# Patient Record
Sex: Male | Born: 1960 | Race: White | Hispanic: No | Marital: Married | Smoking: Never smoker
Health system: Southern US, Community
[De-identification: ages and names within clinical notes are randomized; demographics above are authoritative.]

## PROBLEM LIST (undated history)

## (undated) DIAGNOSIS — R12 Heartburn: Secondary | ICD-10-CM

## (undated) DIAGNOSIS — R001 Bradycardia, unspecified: Secondary | ICD-10-CM

## (undated) DIAGNOSIS — E039 Hypothyroidism, unspecified: Secondary | ICD-10-CM

## (undated) DIAGNOSIS — E119 Type 2 diabetes mellitus without complications: Secondary | ICD-10-CM

## (undated) DIAGNOSIS — R002 Palpitations: Secondary | ICD-10-CM

## (undated) DIAGNOSIS — I8390 Asymptomatic varicose veins of unspecified lower extremity: Secondary | ICD-10-CM

## (undated) DIAGNOSIS — E669 Obesity, unspecified: Secondary | ICD-10-CM

## (undated) DIAGNOSIS — I10 Essential (primary) hypertension: Secondary | ICD-10-CM

## (undated) DIAGNOSIS — M7989 Other specified soft tissue disorders: Secondary | ICD-10-CM

## (undated) DIAGNOSIS — R7303 Prediabetes: Secondary | ICD-10-CM

## (undated) DIAGNOSIS — E7800 Pure hypercholesterolemia, unspecified: Secondary | ICD-10-CM

## (undated) DIAGNOSIS — I4891 Unspecified atrial fibrillation: Secondary | ICD-10-CM

## (undated) DIAGNOSIS — M549 Dorsalgia, unspecified: Secondary | ICD-10-CM

## (undated) DIAGNOSIS — E785 Hyperlipidemia, unspecified: Secondary | ICD-10-CM

## (undated) DIAGNOSIS — I4819 Other persistent atrial fibrillation: Secondary | ICD-10-CM

## (undated) DIAGNOSIS — I77810 Thoracic aortic ectasia: Secondary | ICD-10-CM

## (undated) DIAGNOSIS — M2550 Pain in unspecified joint: Secondary | ICD-10-CM

## (undated) DIAGNOSIS — I428 Other cardiomyopathies: Secondary | ICD-10-CM

## (undated) DIAGNOSIS — M1990 Unspecified osteoarthritis, unspecified site: Secondary | ICD-10-CM

## (undated) DIAGNOSIS — G4733 Obstructive sleep apnea (adult) (pediatric): Secondary | ICD-10-CM

## (undated) HISTORY — DX: Pain in unspecified joint: M25.50

## (undated) HISTORY — DX: Essential (primary) hypertension: I10

## (undated) HISTORY — DX: Dorsalgia, unspecified: M54.9

## (undated) HISTORY — DX: Other cardiomyopathies: I42.8

## (undated) HISTORY — DX: Hyperlipidemia, unspecified: E78.5

## (undated) HISTORY — DX: Asymptomatic varicose veins of unspecified lower extremity: I83.90

## (undated) HISTORY — DX: Thoracic aortic ectasia: I77.810

## (undated) HISTORY — DX: Other specified soft tissue disorders: M79.89

## (undated) HISTORY — DX: Pure hypercholesterolemia, unspecified: E78.00

## (undated) HISTORY — PX: CARDIAC SURGERY: SHX584

## (undated) HISTORY — DX: Obstructive sleep apnea (adult) (pediatric): G47.33

## (undated) HISTORY — DX: Obesity, unspecified: E66.9

## (undated) HISTORY — DX: Heartburn: R12

## (undated) HISTORY — DX: Bradycardia, unspecified: R00.1

## (undated) HISTORY — DX: Other persistent atrial fibrillation: I48.19

## (undated) HISTORY — PX: COLONOSCOPY: SHX174

## (undated) HISTORY — DX: Palpitations: R00.2

## (undated) HISTORY — DX: Prediabetes: R73.03

---

## 2001-06-14 ENCOUNTER — Inpatient Hospital Stay (HOSPITAL_COMMUNITY): Admission: EM | Admit: 2001-06-14 | Discharge: 2001-06-18 | Payer: Self-pay | Admitting: Emergency Medicine

## 2002-02-08 ENCOUNTER — Inpatient Hospital Stay (HOSPITAL_COMMUNITY): Admission: AD | Admit: 2002-02-08 | Discharge: 2002-02-10 | Payer: Self-pay | Admitting: Cardiology

## 2003-07-20 ENCOUNTER — Ambulatory Visit (HOSPITAL_BASED_OUTPATIENT_CLINIC_OR_DEPARTMENT_OTHER): Admission: RE | Admit: 2003-07-20 | Discharge: 2003-07-20 | Payer: Self-pay | Admitting: Cardiology

## 2003-11-05 ENCOUNTER — Encounter: Admission: RE | Admit: 2003-11-05 | Discharge: 2003-11-05 | Payer: Self-pay | Admitting: Cardiology

## 2004-05-13 ENCOUNTER — Ambulatory Visit (HOSPITAL_COMMUNITY): Admission: RE | Admit: 2004-05-13 | Discharge: 2004-05-13 | Payer: Self-pay | Admitting: Cardiology

## 2005-12-14 ENCOUNTER — Encounter: Admission: RE | Admit: 2005-12-14 | Discharge: 2005-12-14 | Payer: Self-pay | Admitting: Endocrinology

## 2006-10-26 ENCOUNTER — Ambulatory Visit: Payer: Self-pay | Admitting: Vascular Surgery

## 2007-02-01 ENCOUNTER — Ambulatory Visit: Payer: Self-pay | Admitting: Vascular Surgery

## 2007-03-14 ENCOUNTER — Ambulatory Visit: Payer: Self-pay | Admitting: Vascular Surgery

## 2007-03-23 ENCOUNTER — Ambulatory Visit: Payer: Self-pay | Admitting: Vascular Surgery

## 2007-06-13 ENCOUNTER — Ambulatory Visit: Payer: Self-pay | Admitting: Vascular Surgery

## 2007-06-27 ENCOUNTER — Ambulatory Visit: Payer: Self-pay | Admitting: Vascular Surgery

## 2009-04-10 ENCOUNTER — Inpatient Hospital Stay (HOSPITAL_COMMUNITY): Admission: EM | Admit: 2009-04-10 | Discharge: 2009-04-16 | Payer: Self-pay | Admitting: Emergency Medicine

## 2009-04-10 ENCOUNTER — Ambulatory Visit: Payer: Self-pay | Admitting: Internal Medicine

## 2009-04-11 ENCOUNTER — Encounter: Payer: Self-pay | Admitting: Internal Medicine

## 2010-09-25 LAB — COMPREHENSIVE METABOLIC PANEL
ALT: 87 U/L — ABNORMAL HIGH (ref 0–53)
Albumin: 3.4 g/dL — ABNORMAL LOW (ref 3.5–5.2)
Alkaline Phosphatase: 64 U/L (ref 39–117)
CO2: 26 mEq/L (ref 19–32)
Creatinine, Ser: 0.93 mg/dL (ref 0.4–1.5)
GFR calc Af Amer: >60 mL/min (ref >60)
GFR calc non Af Amer: >60 mL/min (ref >60)
Glucose, Bld: 106 mg/dL — ABNORMAL HIGH (ref 70–99)
Total Protein: 5.8 g/dL — ABNORMAL LOW (ref 6.0–8.3)

## 2010-09-25 LAB — DIFFERENTIAL
Basophils Absolute: 0.1 10*3/uL (ref 0.0–0.1)
Eosinophils Absolute: 0.1 10*3/uL (ref 0.0–0.7)
Eosinophils Absolute: 0.3 10*3/uL (ref 0.0–0.7)
Eosinophils Relative: 1 % (ref 0–5)
Eosinophils Relative: 3 % (ref 0–5)
Lymphocytes Relative: 21 % (ref 12–46)
Lymphs Abs: 2.2 10*3/uL (ref 0.7–4.0)
Monocytes Relative: 7 % (ref 3–12)
Neutro Abs: 7.2 10*3/uL (ref 1.7–7.7)
Neutrophils Relative %: 71 % (ref 43–77)

## 2010-09-25 LAB — CBC
HCT: 35.1 % — ABNORMAL LOW (ref 39.0–52.0)
Hemoglobin: 12 g/dL — ABNORMAL LOW (ref 13.0–17.0)
MCHC: 34.2 g/dL (ref 30.0–36.0)
MCV: 87.8 fL (ref 78.0–100.0)
MCV: 88.6 fL (ref 78.0–100.0)
Platelets: 164 10*3/uL (ref 150–400)
Platelets: 185 10*3/uL (ref 150–400)
Platelets: 212 10*3/uL (ref 150–400)
Platelets: 214 10*3/uL (ref 150–400)
RBC: 3.96 MIL/uL — ABNORMAL LOW (ref 4.22–5.81)
RBC: 4.48 MIL/uL (ref 4.22–5.81)
RDW: 14.3 % (ref 11.5–15.5)
RDW: 14.5 % (ref 11.5–15.5)
WBC: 10.3 10*3/uL (ref 4.0–10.5)

## 2010-09-25 LAB — BASIC METABOLIC PANEL
BUN: 11 mg/dL (ref 6–23)
BUN: 17 mg/dL (ref 6–23)
BUN: 9 mg/dL (ref 6–23)
CO2: 28 mEq/L (ref 19–32)
Calcium: 8.6 mg/dL (ref 8.4–10.5)
Calcium: 8.8 mg/dL (ref 8.4–10.5)
Calcium: 8.8 mg/dL (ref 8.4–10.5)
Chloride: 106 mEq/L (ref 96–112)
Creatinine, Ser: 0.86 mg/dL (ref 0.4–1.5)
Creatinine, Ser: 0.89 mg/dL (ref 0.4–1.5)
Creatinine, Ser: 0.9 mg/dL (ref 0.4–1.5)
GFR calc non Af Amer: >60 mL/min (ref >60)
GFR calc non Af Amer: >60 mL/min (ref >60)
Glucose, Bld: 101 mg/dL — ABNORMAL HIGH (ref 70–99)
Glucose, Bld: 113 mg/dL — ABNORMAL HIGH (ref 70–99)
Potassium: 4.4 mEq/L (ref 3.5–5.1)

## 2010-09-25 LAB — LIPID PANEL
Cholesterol: 105 mg/dL (ref 0–200)
LDL Cholesterol: 62 mg/dL (ref 0–99)
Triglycerides: 98 mg/dL (ref <150)

## 2010-09-25 LAB — HEMOGLOBIN A1C
Hgb A1c MFr Bld: 5.9 % (ref 4.6–6.1)
Mean Plasma Glucose: 123 mg/dL

## 2010-09-25 LAB — MAGNESIUM: Magnesium: 1.9 mg/dL (ref 1.5–2.5)

## 2010-09-25 LAB — PROTIME-INR
INR: 1.01 (ref 0.00–1.49)
INR: 1.03 (ref 0.00–1.49)
INR: 1.11 (ref 0.00–1.49)
INR: 1.56 — ABNORMAL HIGH (ref 0.00–1.49)
Prothrombin Time: 13.4 seconds (ref 11.6–15.2)
Prothrombin Time: 14.2 seconds (ref 11.6–15.2)
Prothrombin Time: 16.1 seconds — ABNORMAL HIGH (ref 11.6–15.2)
Prothrombin Time: 18.5 seconds — ABNORMAL HIGH (ref 11.6–15.2)
Prothrombin Time: 22.1 seconds — ABNORMAL HIGH (ref 11.6–15.2)

## 2010-09-25 LAB — APTT: aPTT: 24 seconds (ref 24–37)

## 2010-09-25 LAB — TSH: TSH: 1.069 u[IU]/mL (ref 0.350–4.500)

## 2010-09-25 LAB — CK TOTAL AND CKMB (NOT AT ARMC)
CK, MB: 3.3 ng/mL (ref 0.3–4.0)
Relative Index: 1.6 (ref 0.0–2.5)

## 2010-10-11 IMAGING — CR DG CHEST 2V
2 series · 2 of 2 positions shown · non-contrast
Comparison: Most recent chest of 11/05/2003

CLINICAL DATA: Palpitations, atrial fibrillation

CHEST - 2 VIEW

[w chest pa]
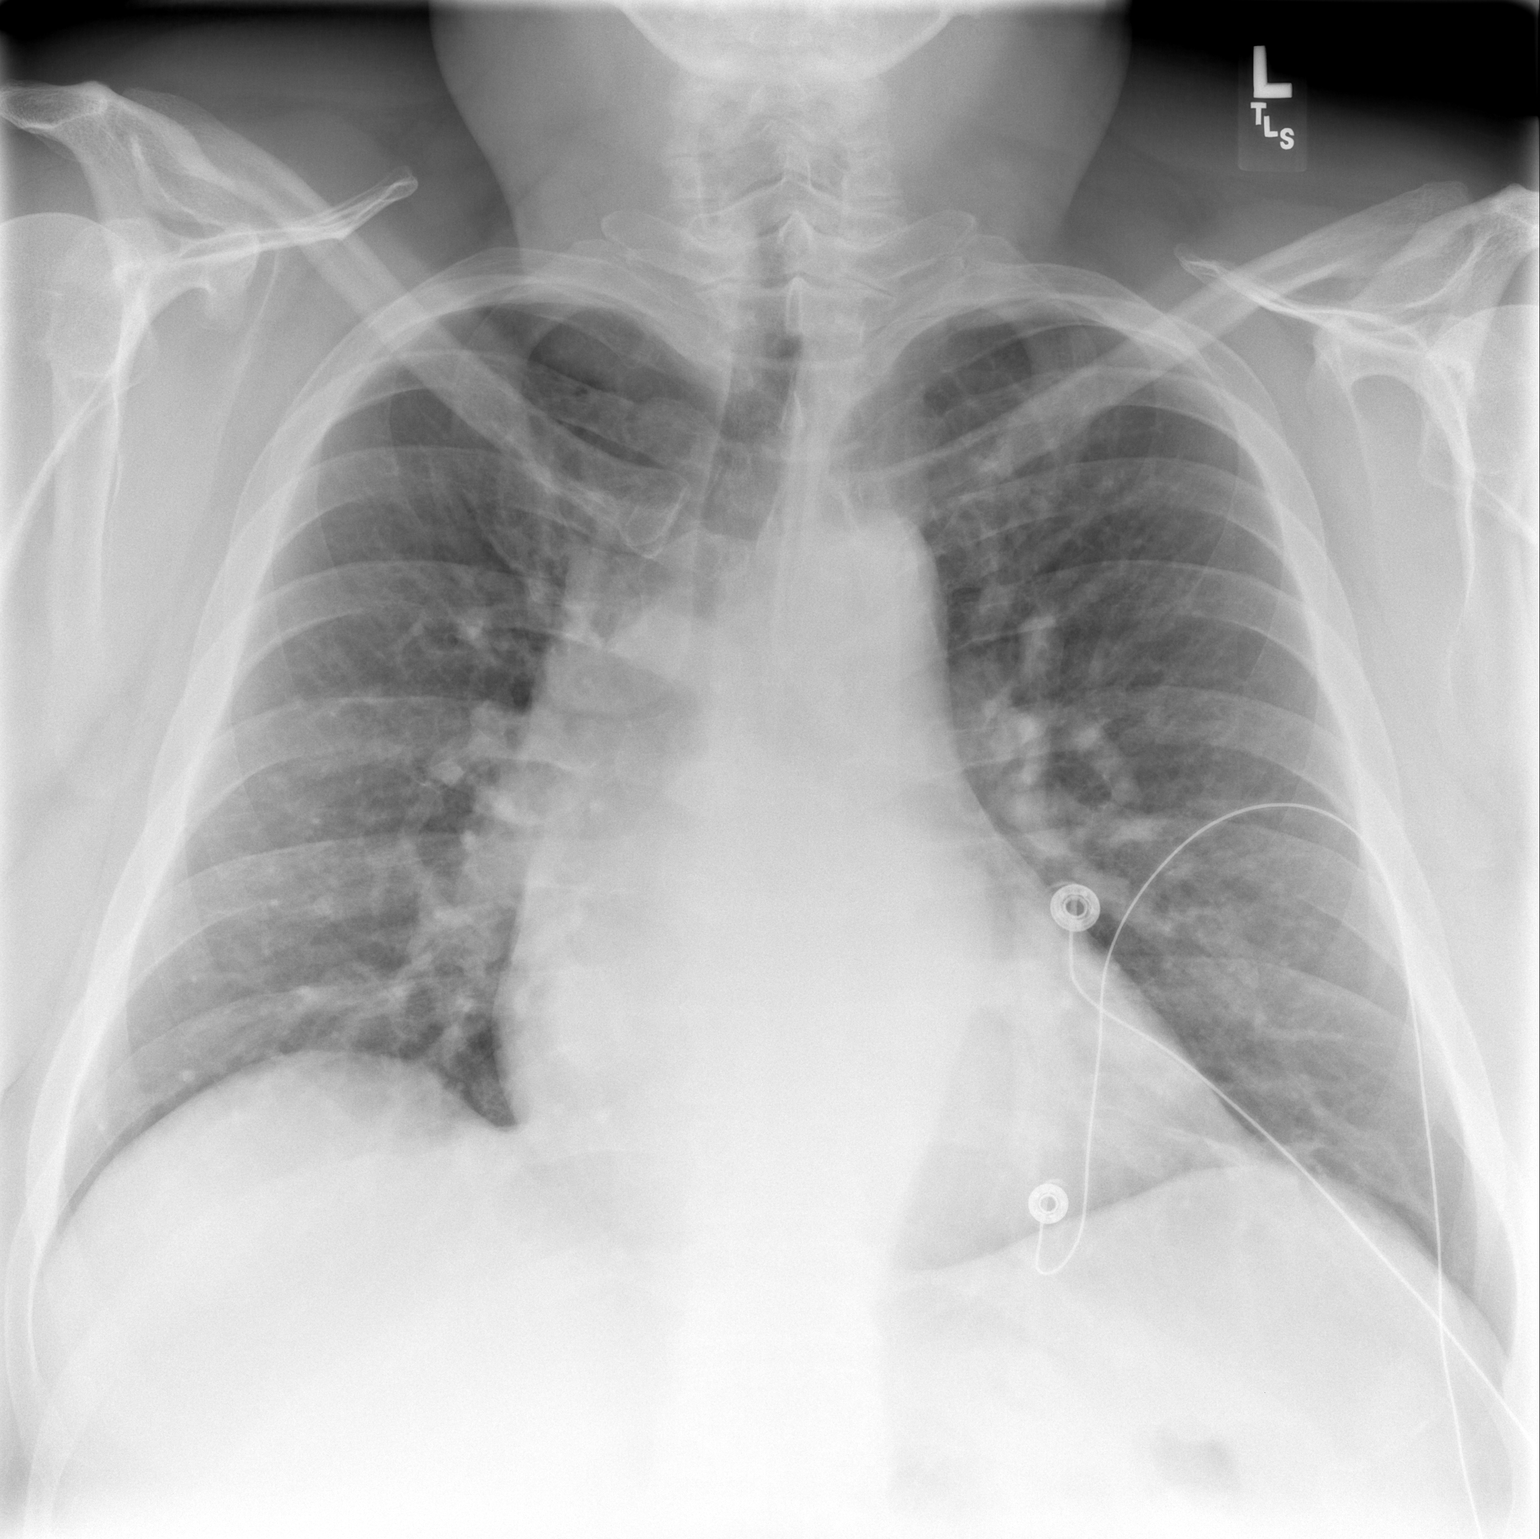

[w chest lat *]
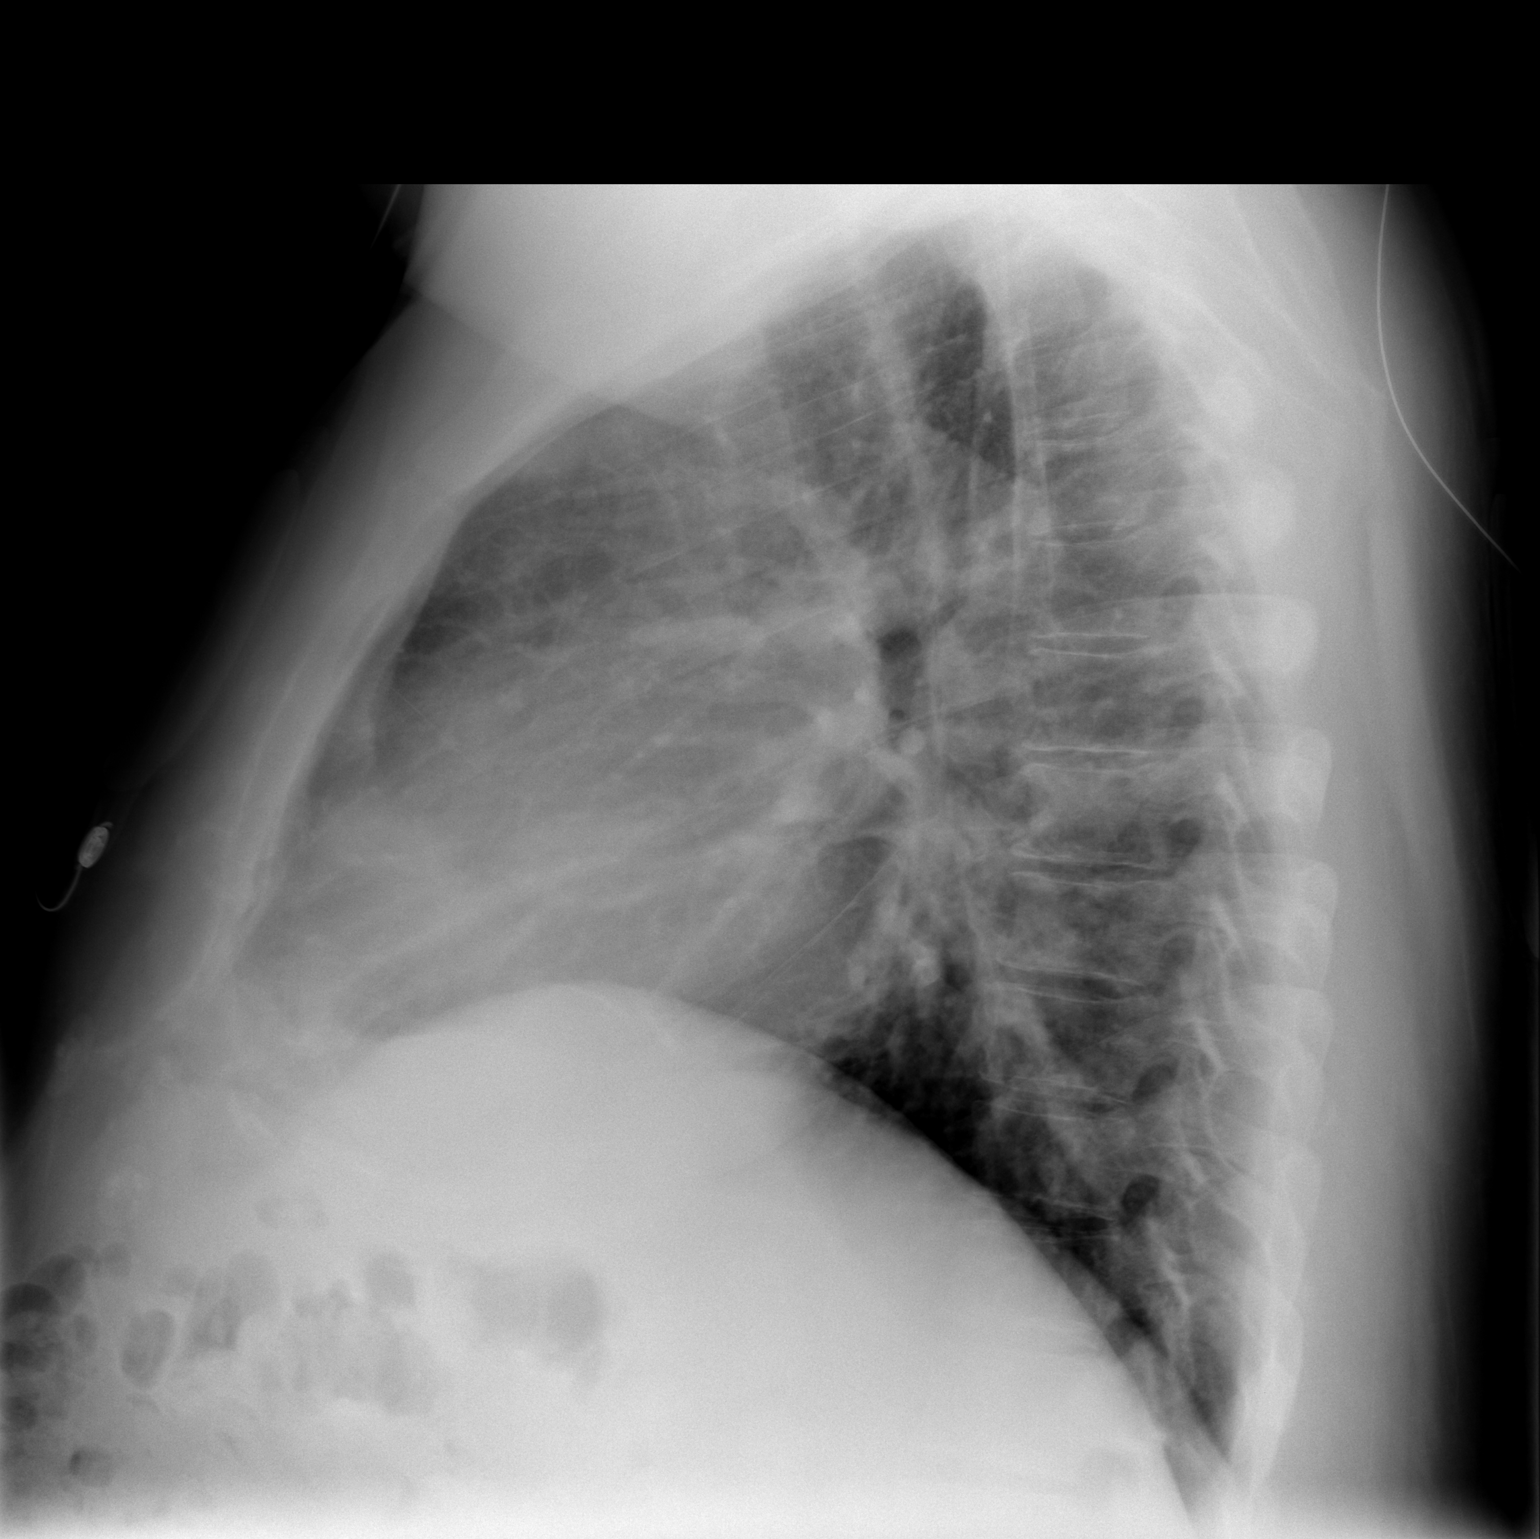

[2 of 2 positions shown; findings below may reference images not displayed]

FINDINGS: The heart is enlarged.  There is mild vascular
congestion.  There is no overt infiltrate or failure.  Worsening
aeration compared with priors.  No bony abnormality.
IMPRESSION: As  above.

## 2010-11-04 NOTE — Assessment & Plan Note (Signed)
OFFICE VISIT   RHODERICK, Geoffrey Robinson  DOB:  05/26/1961                                       02/01/2007  [id number]#:[id number]   Mr. 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  He has severe venous  insufficiency with gross saphenofemoral reflux and reflux throughout  both greater saphenous veins.  He did have a surgical procedure in 1995  where the medial aspect of his right greater saphenous vein was removed  but our venous duplex exam performed May 6 of this year reveals a large  residual lateral branch with reflux feeding a large string of  varicosities into the distal thigh and the medial calf.  He has  hyperpigmentation with no ulcer formation.  On the left side he has a  similar situation with a large nest of varicosities in the medial calf  and hyperpigmentation distally.  His elastic decompression stockings  have not relieved his symptoms.  He continues to have severe aching and  throbbing and discomfort with distal edema, and these severe skin  changes.  He is on chronic Coumadin for an arrhythmia he had in the  past.  I believe we should proceed with laser ablation of his right  greater saphenous vein with multiple stab phlebectomies after  discontinuing Coumadin for 6 days following his precertification by the  insurance company.  We will then follow in the future with laser  ablation only of his left greater saphenous vein.   Quita Skye Hart Rochester, M.D.  Electronically Signed   JDL/MEDQ  D:  02/01/2007  T:  02/03/2007  Job:  [id number]

## 2010-11-04 NOTE — Assessment & Plan Note (Signed)
OFFICE VISIT   PARNELL, SPIELER  DOB:  1961-01-04                                       06/27/2007  AVWUJ#:[id number]   The patient returns today 2 weeks post laser ablation of his left  greater saphenous vein for severe venous insufficiency of the left leg  and a history of venous stasis ulcers.  THE procedure was performed on  December 22 with no stab phlebectomies or sclerotherapy.  He has had no  worsening of the chronic left leg edema since the procedure, but has had  some mild to moderate aching discomfort in the thigh, as one would  expect following the laser ablation procedure.  He has had no blistering  of the skin or erythema, and the tenderness is now resolved.  The areas  of hyperpigmentation and scaliness in the left lower legs are unchanged  with no active ulceration.   EXAM:  Blood pressure 153/91, heart rate is 81, respirations 16.  He has  excellent femoral, poplitea, and dorsalis pedis pulses palpable  bilaterally.  The left leg has some mild tenderness to deep palpation  over the greater saphenous vein, and the area of hyperpigmentation in  the medial aspect of the left calf is unchanged.  There is no active  ulceration.  I performed a limited venous duplex exam in the office  today.  There is no evidence of any deep venous obstruction.  The  saphenous vein is totally occluded from the saphenofemoral junction to  the knee and non-compressible, with normal-appearing flow in the deep  venous system.   I have reassured him regarding these findings, and suggested that he  wear elastic compression stockings (short-leg) on a chronic basis.  He  will return in 6 months for a final followup venous duplex exam of both  lower extremities.   Quita Skye Hart [place], M.D.  Electronically Signed   JDL/MEDQ  D:  06/27/2007  T:  06/28/2007  Job:  [id number]

## 2010-11-04 NOTE — Assessment & Plan Note (Signed)
OFFICE VISIT   [name], [name]  DOB:  Jan 25, 1961                                       03/23/2007  [id number]#:[id number]   NOTE:  Geoffrey Robinson presents today for followup of his right leg laser  ablation and multiple stab phlebectomies with laser ablation of the  greater saphenous vein.  He has the usual amount of soreness and does  have some significant irritation at the upper edge from the compression  garment.  He is now 10 days and is instructed to discontinue the use of  his compression garment since this is causing him a great deal of  difficulty and discomfort.  His stab phlebectomy sites are all healing  quite nicely.  He does have usual amount of bruising and erythema in the  mid thigh.  He underwent limited venous duplex, and this reveals closure  of his saphenous vein from the knee to the saphenofemoral junction.  The  common femoral vein is widely patent, and the saphenous vein is occluded  up to the saphenofemoral junction.  I am pleased with his initial  results.  He will continue his followup with Dr. Hart Rochester and has plans  for laser ablation of his left greater saphenous vein and will schedule  this in our office.   Larina Earthly, M.D.  Electronically Signed   TFE/MEDQ  D:  03/23/2007  T:  03/24/2007  Job:  [id number]   cc:   Quita Skye. Hart Rochester, M.D.

## 2010-11-07 NOTE — Op Note (Signed)
[place]. [facility]  Patient:    KIET, GEER Visit Number: [id number] MRN: [id number]          Service Type: MED Location: 2000 2019 01 Attending Physician:  [name] Dictated by:   Doylene Canning. Ladona Ridgel, M.D. [facility] Proc. Date: 06/17/01 Admit Date:  06/14/2001 Discharge Date: 06/18/2001   CC:         [name], M.D.  Kathrine Cords   Operative Report  HISTORY OF PRESENT ILLNESS:  The patient is a very pleasant young man with a two year history of tachy-palpitations who was admitted to the hospital with chest pain and shortness of breath in association with a wide QRS tachycardia with right bundle branch block morphology at 250 beats per minute.  He was found to have multiple atrial arrhythmias, and is now referred for an electrophysiological study.  DESCRIPTION OF PROCEDURE:  After informed consent was obtained, the patient was taken to the diagnostic EP lab in the fasted state.  After usual preparation and draping, intravenous phentanyl and midazolam was given for sedation.  A 6 [place] hexipolar catheter was inserted percutaneously in the right jugular vein and advanced to the coronary sinus.  A 5 French quadripolar catheter was inserted percutaneously in the right femoral vein and advanced to the RV apex.  A 5 French quadripolar catheter was inserted percutaneously in the right femoral vein, and advanced to the hisbundle region.  After measurement of the basic intervals, rapid ventricular pacing was carried out from the RV apex at a pacing cycle length of 590 milliseconds, and stepwise decreased down to 400 milliseconds where [place] wenkebach was observed.  During rapid ventricular pacing, the atrial activation sequence was midline and decremental.  Next, programmed ventricular stimulation was carried out from the RV apex at a basic dry cycle length of 600 milliseconds.  The S1/S2 interval was stepwise decreased down to 230 milliseconds where a  retrograde AV node ERP was observed.  Once again, during programmed ventricular stimulation the atrial activation sequence was midline and decremental.  Next, programmed atrial stimulation was carried out from the coronary sinus at a basic dry cycle length of 600 milliseconds.  The S1/S2 interval stepwise decreased down to 230 milliseconds where the atrial ERP was observed.  During programmed atrial stimulation there was no inducible supraventricular tachycardia.  Next, rapid atrial pacing was carried out from the coronary sinus at a pacing cycle length of 490 milliseconds, and stepwise decreased down to 320 milliseconds where AV wenkebach was observed.  Additional decrements of atrial pacing cycle length were then carried out.  At a pacing cycle length of 270 milliseconds, there was inducible atrial tachycardia.  This arrhythmia was not sustained. The cycle length was approximately 300 milliseconds, and there was 1:1 AV conduction.  The earliest activation sequence was in the coronary sinus, both distal and proximal catheters were nearly simultaneous in their activation. This tachycardia terminated spontaneously after several seconds.  Additional rapid atrial pacing down to 230 milliseconds resulted in the initiation of atrial flutter.  The atrial flutter cycle length varied between 219 and 230 milliseconds.  During atrial flutter there was intermittent variable AV conduction along with 2:1 AV conduction.  There was right bundle branch block aberrancy.  At this point, the 20 pole Halo catheter was inserted after the 5 French RV catheter was removed, and mapping was carried out of the ______ atrial flutter.  The atrial activation sequence around the tricuspid angles was counterclockwise in a typical atrial flutter  isthmus conduction.  The quadripolar ablation catheter was then maneuvered into the right atrium, and 7 RF synergy applications were subsequently carried out between the  tricuspid valve annulus and the inferior vena cava.  During the seventh RF synergy application, atrial flutter was terminated and sinus rhythm restored.  Rapid pacing was carried out in the coronary sinus demonstrating residual isthmus conduction.  Four additional RF energy applications were then delivered to the atrial flutter isthmus which resulted in decrease of bidirectional block.  A bonus RF energy applications were also subsequently given, and the patient was observed for a total of 45 minutes.  After this, he had no inducible atrial flutter or other atrial arrhythmias.  The catheters were then removed. Hemostasis was assured, and the patient was returned to his room in satisfactory condition.  COMPLICATIONS:  None.  RESULTS: 1. Baseline ECG:  The baseline ECG demonstrated normal sinus rhythm with    normal axis and intervals. 2. Baseline intervals:  Sinus cycle length was 782 milliseconds.  The AV    interval was 55 milliseconds.  PR interval was 181 milliseconds. 3. Rapid atrial pacing:  Rapid atrial pacing carried out from the coronary    sinus was stepwise decreased from 490 milliseconds down to 320 milliseconds    where AV wenkebach was observed.  Additional decrements down to 270    milliseconds resulted in initiation of a nonsustained atrial tachycardia,    the etiology of which is unknown.  This tachycardia terminated    spontaneously.  Additional rapid atrial pacing was then carried out down to    230 milliseconds which resulted in initiation of typical atrial flutter at    a cycle length of 220 milliseconds. 4. Programmed atrial stimulation:  Programmed atrial stimulation was carried    out from the coronary sinus at a basic dry cycle length of 600    milliseconds.  The S1/S2 interval was stepwise decreased down to 230    milliseconds where an ERP of the atrium was observed. 5. Rapid ventricular pacing:  Rapid ventricular pacing was carried out from     the RV apex  and stepwise decreased down to 400 milliseconds where the [place]    wenkebach cycle length was observed.  During rapid ventricular pacing the    atrial activation sequence was again midline and decremental. 6. Programmed ventricular stimulation:  Programmed ventricular stimulation was    carried out from the RV apex at a basic dry cycle length of 500    milliseconds.  The S1/S2 interval was stepwise decreased down to 230    milliseconds where the ERP of the atrium was observed. 7. Arrhythmias observed:  Atrial flutter.  Initiation with rapid atrial    pacing, duration sustained.  Termination with catheter ablation, cycle    length 230 milliseconds.  Atrial tachycardia.  Initiation of rapid atrial    pacing, duration nonsustained, termination spontaneous, cycle length 300    milliseconds. 8. Mapping:  Mapping of the patients atrial flutter demonstrated typical    counterclockwise tricuspid annular activation. 9. RF energy application:  A total of 19 RF energy applications and ______ 7    RF energy applications which terminated atrial flutter and restored sinus    rhythm, 4 RF energy applications which created bidirectional block in the    atrial flutter isthmus, and 8 bonus RF energy applications were    subsequently delivered to the usual atrial flutter isthmus.  Following the    procedure, the patient was observed for  45 minutes.  There was no evidence    of any residual isthmus conduction or inducible flutter.  CONCLUSIONS:  This study demonstrates inducible atrial flutter, status post RV catheter ablation with a total of 19 RF energy applications delivered to the usual atrial flutter isthmus.  The patient tolerated the procedure very well. Dictated by:   Doylene Canning. Ladona Ridgel, M.D. [facility] Attending Physician:  [name] DD:  06/17/01 TD:  06/18/01 Job: [id number]

## 2010-11-07 NOTE — Cardiovascular Report (Signed)
[place]. [facility]  Patient:    Geoffrey Robinson, Geoffrey Robinson Visit Number: [id number] MRN: [id number]          Service Type: MED Location: 2000 2019 01 Attending Physician:  [name] Dictated by:   [name], M.D. Proc. Date: 06/16/01 Admit Date:  06/14/2001   CC:         Doylene Canning. Ladona Ridgel, M.D. [facility]   Cardiac Catheterization  PROCEDURES:  Left heart catheterization, coronary angiography, left ventriculography.  OPERATOR:  [name], M.D.  INDICATIONS:  SVT, positive cardiac enzymes, and LV dysfunction.  COMPLICATIONS:  None.  IV MEDICATIONS:  Lopressor 5 mg IV in catheterization lab.  This is a 50 year old white male who has a one- to two-year history of intermittent palpitations almost always terminated by coughing.  He presented to the emergency room in rapid SVT with right bundle branch aberrancy at 250 beats per minute and was given 6 mg of IV adenosine with a change in his heart rhythm to 140 beats per minute and then subsequently to sinus rhythm.  He did rule in for a small non-Q-wave MI by positive cardiac enzymes that were only mildly elevated.  A 2D echocardiogram showed mild to moderate LV dysfunction with inferior posterior hypokinesis.  He now presents for cardiac catheterization.  DESCRIPTION OF PROCEDURE:  The patient is brought to the cardiac catheterization laboratory in the fasting nonsedated state.  Informed consent was obtained.  The patient was connected to continuous heart rate and pulse oximetry monitoring and intermittent blood pressure monitoring.  The right groin was prepped and draped in a sterile fashion.  Xylocaine, 1%, was used for local anesthesia.  Using the modified Seldinger technique, a 6-French sheath was placed in the right femoral artery.  Under fluoroscopic guidance, a 6-French JL4 catheter was placed in the left coronary artery.  Multiple cine films were taken in the 30-degree RAO, 40-degree LAO views.   This catheter was then exchanged out over a guidewire for a 6-French JR4 catheter which was placed under fluoroscopic guidance into the right coronary artery.  Multiple cine films were taken in the 30-degree RAO, 40-degree LAO views.  This catheter was then exchanged out over a guidewire for a 6-French angled pigtail catheter which was placed in the left ventricular cavity.  Left ventriculography was performed in 30-degree RAO view using a total of 30 cc of contrast at 13 cc per second.  The catheter was then pulled back across the aortic valve and removed over a guidewire.  Of note, the patient did develop intermittent SVT and paroxysmal atrial fibrillation during the study requiring a total of 5 mg of IV Lopressor.  All catheters and sheaths were removed. Manual compression was performed until adequate hemostasis was obtained.  The patient was transferred back to the room in stable condition.  RESULTS: 1. The left main coronary artery was widely patent and bifurcates into the    left anterior descending artery and left circumflex artery. 2. The left anterior descending artery is large and widely patent throughout    the course.  It gives rise to one large diagonal branch which has several    daughter branches, all of which are widely patent. 3. The left circumflex is widely patent throughout its course and gives rise    to two obtuse marginal branches, both of which are widely patent. 4. The right coronary artery is a large vessel as well and gives off three    RV marginal branches which are widely  patent.  The right coronary artery    is widely patent throughout the course and bifurcates distally into a    posterior descending artery and a posterolateral artery.  They are both    widely patent.  Left ventriculography performed in the 30-degree RAO view using a total of 30 cc of contrast at 13 cc per second showed mild to moderately dilated LV with moderate LV dysfunction globally.   EF 35-40%.  ASSESSMENT: 1. Supraventricular tachycardia with paroxysmal atrial fibrillation. 2. Positive cardiac enzymes secondary to supraventricular tachycardia. 3. Normal coronary arteries. 4. 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  PLAN:  Continue Lovenox for now.  EP consult, Dr. Ladona Ridgel, for possible RFA of SVT.  IV Cardizem drip with a 20 mg IV bolus then 5 mg per hour.  Will increase the dose to keep heart rate less than 100.  Increase Toprol XL to 50 mg a day.Dictated by:   [name], M.D. Attending Physician:  [name] DD:  06/16/01 TD:  06/16/01 Job: [id number]

## 2010-11-07 NOTE — H&P (Signed)
[place]. [facility]  Patient:    Geoffrey Robinson, Geoffrey Robinson Visit Number: [id number] MRN: [id number]          Service Type: MED Location: 2000 2019 01 Attending Physician:  [name] Dictated by:   [name], M.D. Admit Date:  06/14/2001                           History and Physical  CHIEF COMPLAINT:  Palpitations, SVT.  HISTORY OF PRESENT ILLNESS:  This is a 50 year old white male with no previous cardiac history but symptoms of palpitations for 1-1/2 years.  He presented to the emergency room with complaints of palpitations starting this morning.  He has had palpitations for the past 1-1/2 years.  No history of dizziness or syncope.  Episodes typically are terminated with coughing.  Occasional mild chest pain with episodes but has chest pain on no other occasions, and no exertional shortness of breath.  He only has shortness of breath with palpitations.  Today he finished washing dishes and developed palpitations which persisted despite his usual maneuvers to terminate them.  EMS was called, and he was given 6 mg of IV adenosine for SVT with right bundle branch block, with a very rapid heart rate of 252 beats per minute.  PAST MEDICAL HISTORY: 1. Palpitations for 1-1/2 years. 2. Knee pain.  PAST SURGICAL HISTORY: 1. Knee arthroscopy. 2. Right leg surgery for superficial thrombophlebitis.  ALLERGIES:  None.  MEDICATIONS:  Aleve and aspirin.  SOCIAL HISTORY:  He denies tobacco or alcohol use.  He is married.  He is a Curator.  FAMILY HISTORY:  His mother and father both had coronary artery bypass grafting in their 25s and 42s.  REVIEW OF SYSTEMS:  Otherwise negative.  PHYSICAL EXAMINATION:  GENERAL:  Well-developed, well-nourished white male in no acute distress.  VITAL SIGNS:  Blood pressure 128/80, pulse 98, temperature 99.2, respirations 22.  NECK:  Supple.  Without lymphadenopathy.  No bruits.  Carotid upstrokes +2.  LUNGS:   Clear to auscultation throughout.  HEART:  Regular rate and rhythm.  No murmurs, rubs, or gallops.  Normal S1, S2.  ABDOMEN:  Soft, nontender, nondistended.  With active bowel sounds.  No hepatosplenomegaly.  EXTREMITIES:  No cyanosis, erythema, edema.  Good distal pulses.  LABORATORY DATA:  BMET within normal limits.  Hemoglobin 15, hematocrit 45. Troponin elevated at 0.14, CPK 216, MB elevated at 5.6, relative index 2.6. Two-dimensional echocardiogram shows low normal LV function with distal lateral and apical inferoposterior hypokinesis. EF around 50%.  Twelve-lead EKG during SVT showed SVT with right bundle branch block at 252 beats per minute.  After adenosine, EKG showed normal sinus rhythm with incomplete right bundle branch block.  No ST and T-wave abnormalities.  ASSESSMENT: 1. Supraventricular tachycardia with right bundle branch block aberration,    terminated with adenosine. 2. Mild chest pressure associated with palpitations.  He does not have    typical exertional chest pain or dyspnea.  He does have a family history of    coronary disease and a 2-D echocardiogram that does show some wall motion    abnormalities.  These could be related to his recent prolonged episode of    tachycardia, and this could also explain the elevation in the cardiac    enzymes.  PLAN:  Will admit to telemetry bed.  Continue to follow cardiac enzymes.  If there is no further increase in the enzymes, we will plan exercise  treadmill test on Thursday.  Otherwise, if the enzymes continue to increase, will plan cardiac catheterization.  Continue Toprol XL 25 mg a day.  Start Lovenox subcutaneous.  Aspirin q.d.  Check a TSH.  Also, check a fasting lipid panel. Dictated by:   [name], M.D. Attending Physician:  [name] DD:  06/14/01 TD:  06/15/01 Job: [id number]

## 2010-11-07 NOTE — Consult Note (Signed)
[place]. [facility]  Patient:    Geoffrey Robinson, Geoffrey Robinson Visit Number: [id number] MRN: [id number]          Service Type: MED Location: 2000 2019 01 Attending Physician:  [name] Dictated by:   Rudene Christians. Ladona Ridgel, M.D. [facility] Proc. Date: 06/16/01 Admit Date:  06/14/2001   CC:         [name], M.D.  Kathrine Cords   Consultation Report  INDICATIONS FOR CONSULTATION:  Evaluation of a wide QRS tachycardia.  REFERRING PHYSICIAN:  [name], M.D.  HISTORY OF PRESENT ILLNESS:  The patient is a very pleasant 50 year old man who has no significant cardiac history who gives a history of palpitations for one to one and a half years.  He states that back in November he had an episode of tachy palpitations which were very rapid and sustained resulting in nausea and diaphoresis for which he did not report to the emergency department.  The patient was seen in his doctors office several days ago and at that time was in SVT ______ was there any wide QRS tachycardia at a rate of 250 beats per minute.  The tachycardia had a right bundle branch block QRS morphology.  The patient notes in the past his ______ maneuvers will terminate his tachycardia but the present tachycardia did not terminate.  By report it was terminated with Adenosine.  He was admitted for evaluation and 2-D echocardiogram demonstrated multiple wall motion abnormality and he subsequently underwent heart catheterization which demonstrated normal coronary arteries and mild to moderately depressed LV systolic function. During his catheterization the patient had recurrent episodes of a different rapid heart rate.  This appears to be atrial fibrillation.  PAST MEDICAL HISTORY:  Notable for a history of knee pain status post arthroscopy in the past.  He also has a right leg surgery for superficial thrombophlebitis.  SOCIAL HISTORY:  The patient denies tobacco or ethanol use and is married.  He works as a  Curator.  FAMILY HISTORY:  Notable for coronary artery disease in his parents, both in their 35s and 68s.  MEDICATIONS:  He takes no cardiac medications and is on only an aspirin a day.  ALLERGIES:  Denies.  REVIEW OF SYSTEMS:  Otherwise negative.  PHYSICAL EXAMINATION  GENERAL:  He is a pleasant, well-appearing, moderately obese man in no distress.  VITAL SIGNS:  Blood pressure 126/72, pulse 70 and irregular, respirations 20.  HEENT:  Normocephalic, atraumatic.  Pupils are equal and round.  Oropharynx: Moist.  NECK:  No jugular venous distention.  There was no thyromegaly.  Trachea was midline.  Carotids were 1+ and symmetric.  LUNGS:  Clear bilaterally.  CARDIOVASCULAR:  Regular rate and rhythm with occasion irregular beat, but no obvious murmurs.  ABDOMEN:  Obese, nontender, nondistended.  EXTREMITIES:  No cyanosis, clubbing, or edema.  LABORATORIES:  His EKG demonstrates normal sinus rhythm with no evidence of ventricular preexcitation.  IMPRESSION: 1. Recurrent wide QRS tachycardia. 2. Paroxysmal atrial fibrillation. 3. 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  DISCUSSION:  I have discussed treatment options with [name].  We will proceed with electrophysiologic testing to better understand the mechanism of his arrhythmia.  If he has SVT or 1:1 atrial flutter then we would plan to proceed with catheter ablation. Dictated by:   Rudene Christians. Ladona Ridgel, M.D. [facility] Attending Physician:  [name] DD:  06/16/01 TD:  06/17/01 Job: [id number]

## 2010-11-07 NOTE — Discharge Summary (Signed)
NAME:  Geoffrey Robinson, SCHREUR NO.:  [id number]   MEDICAL RECORD NO.:  [id number]                   PATIENT TYPE:  INP   LOCATION:  3733                                 FACILITY:  [facility]   PHYSICIAN:  Traci R. Mayford Knife, M.D.               DATE OF BIRTH:  30-Apr-1961   DATE OF ADMISSION:  02/08/2002  DATE OF DISCHARGE:  02/10/2002                                 DISCHARGE SUMMARY   PRIMARY CARE Leaann Nevils:  Kristian Covey, M.D.   DISCHARGE DIAGNOSES:  1. Paroxysmal atrial fibrillation; electively admitted for drug loading with     Betapace.  Patient on Betapace 80 mg p.o. b.i.d. with stable QTc.     Episodic premature atrial contractions, otherwise no significant ectopy.  2. Intermittent bradycardia while at rest/at night with appropriate     increased heart rate to 90s with activity.  3. Systemic anticoagulation secondary to history of paroxysmal atrial     fibrillation:  His admission pro time was 20.1 with INR of 1.9; however,     he had missed a Coumadin dose today prior to admission, so this     borderline low value probably reflecting this.  Thus, he will continue on     his Coumadin dose as prior to admission with followup pro time/INR     September 2.   PLAN:  1. The patient is discharged home in stable condition.  2. Discharge medications:     a. Enteric-coated aspirin 81 mg p.o. q.d.     b. Coumadin 7.5 mg on Sunday, Monday, Wednesday, and Friday and 10 mg        every other day as before.     c. Altace 5 mg p.o. q.d.     d. Betapace (new) 80 mg p.o. b.i.d. (about 12 hours).  3. Diet:  No added salt diet.  4. Followup:  Dr. [name] Friday, September 5, at 3:30 p.m.  Should     have pro time/INR drawn September 2, third floor, [address], with     adjustment in Coumadin p.r.n. if necessary.   HISTORY OF PRESENT ILLNESS:  The patient is a very pleasant 50 year old  gentleman with history of palpitations who was admitted to [facility]  December 2002 for supraventricular tachycardia, finally resolved with 6 mg  of IV adenosine.  He underwent a subsequent cardiac catheterization because  of trace positive cardiac enzymes likely from the supraventricular  tachycardia.  Cardiac catheterization demonstrated normal coronary arteries  but with global LV dysfunction, EF 35%-40%.  The patient subsequently  underwent a successful radiofrequency catheter ablation by Dr. Lewayne Bunting.  Subsequently, he did have recurrence of palpitations initially controlled  with Toprol, which was subsequently discontinued because of side effects.  He then started on Cardizem with good control but ultimately this has waned  with breakthrough palpitation episodes lasting as short as five minutes and  as long  as all day.  His borderline low heart rate precluded increasing  Cardizem.  Thus, he was admitted for initiation of Betapace therapy, details  as above.   PAST MEDICAL HISTORY:  1. Paroxysmal supraventricular tachycardia.     a. (December 2002) PSVT aborted with 6 mg IV adenosine by EMS.     b. (June 17, 2001) Radiofrequency catheter ablation for atrial        flutter by Dr. Lewayne Bunting.     c. (September 06, 2001) Event monitor revealing sinus rhythm with occasional        PVC's/APC's.  2. Tachycardia-induced cardiomyopathy.     a. (December 2002) Cardiac catheterization revealing EF 35%-40% with mild        to moderately dilated LV and moderate LV dysfunction globally.     b. (March 2003) A 2-D echocardiogram revealing EF 60%.  No regional wall        motion abnormality, moderately dilated left atria, 48 mm.  3. Systemic anticoagulation on Coumadin.  4. Knee pain.  5. Right bundle branch block.   PAST SURGICAL HISTORY:  1. Knee arthroscopic surgery.  2. Right leg surgery for superficial thrombophlebitis.   LABORATORY TESTS AND DATA:  Admission pro time was 20.1, INR of 1.2.   EKG on admission revealed 72 beats per minute, NSR, QTc  416 msec.  No  ischemic changes.  QTc stable at time of discharge.   Hemoglobin A1c was okay at 5.7.  TSH okay at 0.758.   COMMENTS:  Total time period for discharge greater than 40 minutes including  dictating this discharge summary, filling out and review of discharge  instruction to the patient and calling in prescriptions.      Salomon Fick, N.P.                       Traci R. Mayford Knife, M.D.    MES/MEDQ  D:  02/10/2002  T:  02/13/2002  Job:  [id number]   cc:   Kristian Covey, M.D.

## 2010-11-07 NOTE — Discharge Summary (Signed)
[place]. [facility]  Patient:    Geoffrey Robinson, Geoffrey Robinson Visit Number: [id number] MRN: [id number]          Service Type: MED Location: 2000 2019 01 Attending Physician:  [name] Dictated by:   Anselm Lis, N.P. Admit Date:  06/14/2001 Discharge Date: 06/18/2001                             Discharge Summary  CONSULTANTS:  Dr. Sharrell Ku.  PROCEDURES: A. (June 14, 2001):  2-D echocardiogram revealing normal LV systolic    function with EF 55-65%.  Severe hypokinesis of basal septal wall.    Severe hypokinesis of basal inferior wall.  Mildly dilated left atrium    of 46 mm.  Septal wall motion abnormality likely due to right bundle branch    block. B. (June 16, 2001):  Coronary angiography revealing normal coronary    arteries.  Mildly dilated LV with LV dysfunction.  Global EF 35-40%. C. (June 17, 2001):  EPS/RFA; successful atrial flutter, NSR after    ablation.  DISCHARGE DIAGNOSES: 1. Paroxysmal atrial flutter with rapid ventricular response; status post    successful radiofrequency ablation (RFA) by Dr. Sharrell Ku. 2. Tachycardia induced cardiomyopathy:  Normal coronary angiography with    ejection fraction 35-40%. 3. Systemic anticoagulation secondary to cardiomyopathy, started on    Coumadin therapy the date of discharge.  PLAN: 1. The patient is discharged to home in stable condition. 2. Discharge medications:    a. (New) Enteric coated aspirin 81 mg p.o. q.d.    b. Coumadin 5 mg 1-1/2 tablets q.d. for 3 days and 1 tablet q.d.       or as directed.    c. Toprol XL 50 mg p.o. q.d. 3. Activity and diet:  As before. 4. Wound care:  May shower. 5. Special instructions:  Call our clinic if he develops large, swelling, or    bruising in groin area. 6. Followup:  Dr. [name] on Tuesday, January 14, at 12:15 in the    afternoon.  He will come to our clinic Tuesday morning at 9 a.m.,    December 31, for blood work; drawing  pro time/INR with subsequent    adjustment of Coumadin pending those results.  LABORATORY TESTS/DATA:  Lipid profile:  Cholesterol 128, triglycerides 151, HDL 33, LDL of 65.  First CK of 214, MB fraction 5.6, troponin I 0.14.  Second CK of 222, MB fraction of 7.1, and troponin I 0.48.  Third CK of 213, MB fraction of 4.5.  Admission sodium 142, potassium 3.7, chloride of 110, CO2 25, glucose 116; 126 on the day of discharge, BUN 15, creatinine 1.1.  LFTs within normal range, though AST slightly elevated at 48, ALT elevated at 89 (reference range 0-40).  Admission WBC of 10, hemoglobin 14.4, hematocrit 42.4, platelets of 212.  On December 28, WBC of 6.9.  Admission coags within normal range.  EKG revealed SVT with right bundle branch block at 252 beats per minute.  After adenosine EKG revealed NSR with incomplete right bundle branch block, no ST-T wave abnormalities.  HISTORY/HOSPITAL COURSE:  Atrial flutter with rapid ventricular response:  The patient with 1-1/2 years of episodic palpitations.  Episodes typically terminated with coughing.  Occasional mild chest pain with the episodes, but no chest pain on other occasions.  No exertional shortness of breath.  Only shortness of breath with palpitations.  The day of admission,  after washing dishes, developed palpitations which persisted despite his usual maneuvers to terminate them.  EMS was called and he was given 6 mg of intravenous adenosine for supraventricular tachycardia with right bundle branch block and a very rapid heart rate of 252 beats per minute.  After adenosine, EKG revealed sinus rhythm with incomplete right bundle branch block.  No ST-T wave abnormalities. Followup 2-D echocardiogram revealed some wall motion abnormalities (detailed above).  On December 26, the patient again developed increased heart rate, 140s to 160s.  He was given IV Cardizem 10 mg, 2.5 mg of Lopressor with subsequent conversion and maintenance of normal  sinus rhythm to the 80s.  He was taken for coronary angiography during which time he was given additional 5 mg IV Lopressor.  Coronary angiography, by Dr. [name], revealed normal coronary arteries and moderate left ventricular dysfunction with ejection fraction of 35-40%, probably secondary tachycardia induced cardiomyopathy.  He was continue on subcutaneous Lovenox.  He was initiated on IV Cardizem drip at 5 mg per hour after 20 mg IV bolus with dose titrated to keep heart rate less than 100.  Toprol XL was increased to 50 mg p.o. q.d.  Dr. Sharrell Ku was consulted; recommended EPS with RFA if indicated.  Lovenox and Plavix were discontinued and Toprol was held.  On December 27, the patient underwent successful radiofrequency ablation of atrial flutter.  He continued maintenance of normal sinus rhythm and was discharged in stable condition on 28th. Dictated by:   Anselm Lis, N.P. Attending Physician:  [name] DD:  07/05/01 TD:  07/06/01 Job: [id number]

## 2013-04-13 ENCOUNTER — Ambulatory Visit (INDEPENDENT_AMBULATORY_CARE_PROVIDER_SITE_OTHER): Payer: BC Managed Care – PPO | Admitting: Pharmacist

## 2013-04-13 DIAGNOSIS — I4891 Unspecified atrial fibrillation: Secondary | ICD-10-CM

## 2013-04-26 ENCOUNTER — Other Ambulatory Visit: Payer: Self-pay | Admitting: Cardiology

## 2013-05-11 ENCOUNTER — Ambulatory Visit (INDEPENDENT_AMBULATORY_CARE_PROVIDER_SITE_OTHER): Payer: BC Managed Care – PPO | Admitting: Pharmacist

## 2013-05-11 DIAGNOSIS — I4891 Unspecified atrial fibrillation: Secondary | ICD-10-CM

## 2013-05-13 ENCOUNTER — Other Ambulatory Visit: Payer: Self-pay | Admitting: Cardiology

## 2013-05-15 NOTE — Telephone Encounter (Signed)
Geoffrey Robinson, warfarin not on med profile

## 2013-06-04 ENCOUNTER — Encounter: Payer: Self-pay | Admitting: Cardiology

## 2013-06-04 DIAGNOSIS — G4733 Obstructive sleep apnea (adult) (pediatric): Secondary | ICD-10-CM | POA: Insufficient documentation

## 2013-06-04 DIAGNOSIS — E669 Obesity, unspecified: Secondary | ICD-10-CM | POA: Insufficient documentation

## 2013-06-04 DIAGNOSIS — I10 Essential (primary) hypertension: Secondary | ICD-10-CM | POA: Insufficient documentation

## 2013-06-04 DIAGNOSIS — Z7901 Long term (current) use of anticoagulants: Secondary | ICD-10-CM | POA: Insufficient documentation

## 2013-06-04 DIAGNOSIS — E785 Hyperlipidemia, unspecified: Secondary | ICD-10-CM | POA: Insufficient documentation

## 2013-06-04 DIAGNOSIS — I4819 Other persistent atrial fibrillation: Secondary | ICD-10-CM | POA: Insufficient documentation

## 2013-06-05 ENCOUNTER — Ambulatory Visit (INDEPENDENT_AMBULATORY_CARE_PROVIDER_SITE_OTHER): Payer: BC Managed Care – PPO | Admitting: *Deleted

## 2013-06-05 ENCOUNTER — Ambulatory Visit (INDEPENDENT_AMBULATORY_CARE_PROVIDER_SITE_OTHER): Payer: BC Managed Care – PPO | Admitting: Cardiology

## 2013-06-05 ENCOUNTER — Encounter: Payer: Self-pay | Admitting: Cardiology

## 2013-06-05 VITALS — BP 127/68 | HR 56 | Ht 75.0 in | Wt 320.0 lb

## 2013-06-05 DIAGNOSIS — Z7901 Long term (current) use of anticoagulants: Secondary | ICD-10-CM

## 2013-06-05 DIAGNOSIS — I10 Essential (primary) hypertension: Secondary | ICD-10-CM

## 2013-06-05 DIAGNOSIS — G4733 Obstructive sleep apnea (adult) (pediatric): Secondary | ICD-10-CM

## 2013-06-05 DIAGNOSIS — I4891 Unspecified atrial fibrillation: Secondary | ICD-10-CM

## 2013-06-05 DIAGNOSIS — I480 Paroxysmal atrial fibrillation: Secondary | ICD-10-CM

## 2013-06-05 DIAGNOSIS — E669 Obesity, unspecified: Secondary | ICD-10-CM

## 2013-06-05 LAB — POCT INR: INR: 2.1

## 2013-06-05 NOTE — Patient Instructions (Signed)
Your physician recommends that you continue on your current medications as directed. Please refer to the Current Medication list given to you today.  Your physician wants you to follow-up in: 6 Months with Dr Turner You will receive a reminder letter in the mail two months in advance. If you don't receive a letter, please call our office to schedule the follow-up appointment.  

## 2013-06-05 NOTE — Progress Notes (Signed)
[address] [place], [place]  [address] Phone: [phone] Fax:  [phone]  Date:  06/05/2013   ID:  Geoffrey Robinson, DOB May 19, 1961, MRN [id number]  PCP:  Pamelia Hoit, MD  Cardiologist:  [name], MD     History of Present Illness: Geoffrey Robinson is a 52 y.o. male with a history of severe OSA on CPAP, obesity, HTN, PAF on chronic anticoagulation who presents toady for followup.  He is doing well.  He denies any chest pain, SOB, DOE, LE edema, dizziness, or syncope.   Rarely he will have a skipped heart beat.  He tolerates his CPAP device well.  He tolerates his mask without problems and feels the pressure is adequate.  He feels rested when he gets up but does have some daytime sleepiness when watching TV.  He goes to bed at 10:30pm and falls asleep quickly and then wakes up at 4 and cannot get back to sleep.     Wt Readings from Last 3 Encounters:  06/05/13 320 lb (145.151 kg)     Past Medical History  Diagnosis Date  . PAF (paroxysmal atrial fibrillation)   . HTN (hypertension)   . Warfarin anticoagulation     , Systemic  . Dyslipidemia   . Obesity   . Severe obstructive sleep apnea     , CPAP at 14cm H2O    Current Outpatient Prescriptions  Medication Sig Dispense Refill  . diltiazem (CARDIZEM CD) 360 MG 24 hr capsule Take 360 mg by mouth daily.      . fenofibrate (TRICOR) 145 MG tablet Take 145 mg by mouth daily.      [name] lisinopril (PRINIVIL,ZESTRIL) 20 MG tablet Take 20 mg by mouth daily.      . metoprolol (LOPRESSOR) 100 MG tablet TAKE ONE TABLET BY MOUTH TWICE DAILY  60 tablet  5  . MULTAQ 400 MG tablet TAKE ONE TABLET BY MOUTH TWICE DAILY  60 tablet  5  . omeprazole (PRILOSEC) 40 MG capsule Take 40 mg by mouth as needed.      . simvastatin (ZOCOR) 10 MG tablet Take 10 mg by mouth daily.      [name] warfarin (COUMADIN) 5 MG tablet Take 1 tablet on all days except 1.5 tablets on Sunday, or as directed by Coumadin Clinic  40 tablet  5   No  current facility-administered medications for this visit.    Allergies:    Allergies  Allergen Reactions  . Plavix [Clopidogrel Bisulfate] Rash    Social History:  The patient  reports that he has never smoked. He does not have any smokeless tobacco history on file. He reports that he drinks alcohol. He reports that he does not use illicit drugs.   Family History:  The patient's family history includes Heart disease in his father; Hypertension in his father and mother.   ROS:  Please see the history of present illness.      All other systems reviewed and negative.   PHYSICAL EXAM: VS:  BP 127/68  Pulse 56  Ht 6\' 3"  (1.905 m)  Wt 320 lb (145.151 kg)  BMI 40.00 kg/m2 Well nourished, well developed, in no acute distress HEENT: normal Neck: no JVD Cardiac:  normal S1, S2; RRR; no murmur Lungs:  clear to auscultation bilaterally, no wheezing, rhonchi or rales Abd: soft, nontender, no hepatomegaly Ext: no edema Skin: warm and dry Neuro:  CNs 2-12 intact, no focal abnormalities noted  ASSESSMENT AND PLAN:  1. OSA on CPAP therapy  - I will get a download from his DME 2. HTN - well controlled  - continue Cardizem/Lisinopril/metoprolol 3. PAF in NSR  - continue metoprolol/cardizem/warfarin 4. Chronic anticoagulation 5. Obesity - encouraged him to continue on his exercise program  Followup with me in 6 months  Signed, [name], MD 06/05/2013 4:00 PM

## 2013-07-06 ENCOUNTER — Ambulatory Visit (INDEPENDENT_AMBULATORY_CARE_PROVIDER_SITE_OTHER): Payer: BC Managed Care – PPO | Admitting: *Deleted

## 2013-07-06 DIAGNOSIS — I4891 Unspecified atrial fibrillation: Secondary | ICD-10-CM

## 2013-07-06 LAB — POCT INR: INR: 2.2

## 2013-08-25 ENCOUNTER — Other Ambulatory Visit: Payer: Self-pay | Admitting: Cardiology

## 2013-09-06 ENCOUNTER — Ambulatory Visit: Payer: Self-pay | Admitting: Pharmacist

## 2013-09-06 ENCOUNTER — Telehealth: Payer: Self-pay | Admitting: Pharmacist

## 2013-09-06 NOTE — Telephone Encounter (Signed)
INR was faxed to our office from [facility] (Dr. Benedetto Goad) today.  Patient told me he would prefer to have his protimes drawn there due to cost and convenience.  I called Dr. Tawana Scale office and spoke with his nurse.  They agree to manage protimes, and state they will call him about today's INR and dose him appropriately and manage this moving forward.

## 2013-10-27 ENCOUNTER — Other Ambulatory Visit: Payer: Self-pay | Admitting: Cardiology

## 2013-12-20 ENCOUNTER — Encounter: Payer: Self-pay | Admitting: *Deleted

## 2013-12-25 ENCOUNTER — Other Ambulatory Visit: Payer: Self-pay | Admitting: Cardiology

## 2014-01-26 ENCOUNTER — Other Ambulatory Visit: Payer: Self-pay | Admitting: Cardiology

## 2014-02-27 ENCOUNTER — Ambulatory Visit (INDEPENDENT_AMBULATORY_CARE_PROVIDER_SITE_OTHER): Payer: BC Managed Care – PPO | Admitting: Cardiology

## 2014-02-27 ENCOUNTER — Encounter: Payer: Self-pay | Admitting: Cardiology

## 2014-02-27 VITALS — BP 118/64 | HR 54 | Ht 75.0 in | Wt 294.0 lb

## 2014-02-27 DIAGNOSIS — I4891 Unspecified atrial fibrillation: Secondary | ICD-10-CM

## 2014-02-27 DIAGNOSIS — I480 Paroxysmal atrial fibrillation: Secondary | ICD-10-CM

## 2014-02-27 DIAGNOSIS — I10 Essential (primary) hypertension: Secondary | ICD-10-CM

## 2014-02-27 DIAGNOSIS — G4733 Obstructive sleep apnea (adult) (pediatric): Secondary | ICD-10-CM

## 2014-02-27 DIAGNOSIS — R001 Bradycardia, unspecified: Secondary | ICD-10-CM

## 2014-02-27 DIAGNOSIS — I498 Other specified cardiac arrhythmias: Secondary | ICD-10-CM

## 2014-02-27 DIAGNOSIS — T50905A Adverse effect of unspecified drugs, medicaments and biological substances, initial encounter: Secondary | ICD-10-CM

## 2014-02-27 NOTE — Progress Notes (Signed)
[address] [place], [place]  [address] Phone: [phone] Fax:  [phone]  Date:  02/27/2014   ID:  IZEAH VOSSLER, DOB [birth year], MRN [id number]  PCP:  Pamelia Hoit, MD  Cardiologist:  [name], MD     History of Present Illness: Geoffrey Robinson is a 53 y.o. male with a history of severe OSA on CPAP, obesity, HTN, PAF on chronic anticoagulation who presents toady for followup. He is doing well. He denies any chest pain, SOB, DOE, LE edema, palpitations or syncope. Rarely he will have some dizziness when going from sitting to standing. He tolerates his CPAP device well. He tolerates his mask without problems and feels the pressure is adequate. He feels rested when he gets up in the am.  He says that he gets about 5 hours of sleep nightly.  He wakes up at 4:30am on his own and cannot go back to sleep.  He walks 40-50 minutes and goes dancing without any problem.   Wt Readings from Last 3 Encounters:  02/27/14 294 lb (133.358 kg)  06/05/13 320 lb (145.151 kg)     Past Medical History  Diagnosis Date  . PAF (paroxysmal atrial fibrillation)   . HTN (hypertension)   . Warfarin anticoagulation     , Systemic  . Dyslipidemia   . Obesity   . Severe obstructive sleep apnea     , CPAP at 14cm H2O    Current Outpatient Prescriptions  Medication Sig Dispense Refill  . diltiazem (CARDIZEM CD) 360 MG 24 hr capsule TAKE ONE CAPSULE BY MOUTH ONE TIME DAILY  30 capsule  2  . fenofibrate (TRICOR) 145 MG tablet Take 145 mg by mouth daily.      [name] lisinopril (PRINIVIL,ZESTRIL) 20 MG tablet Take 20 mg by mouth daily.      . metoprolol (LOPRESSOR) 100 MG tablet TAKE ONE TABLET BY MOUTH TWICE DAILY  60 tablet  0  . MULTAQ 400 MG tablet TAKE ONE TABLET BY MOUTH TWICE DAILY  60 tablet  0  . omeprazole (PRILOSEC) 40 MG capsule Take 40 mg by mouth as needed.      . simvastatin (ZOCOR) 10 MG tablet Take 10 mg by mouth daily.      [name] warfarin (COUMADIN) 5 MG tablet Take 1  tablet on all days except 1.5 tablets on Sunday, or as directed by [facility]madin Clinic  40 tablet  5   No current facility-administered medications for this visit.    Allergies:    Allergies  Allergen Reactions  . Plavix [Clopidogrel Bisulfate] Rash    Social History:  The patient  reports that he has never smoked. He does not have any smokeless tobacco history on file. He reports that he drinks alcohol. He reports that he does not use illicit drugs.   Family History:  The patient's family history includes Heart disease in his father; Hypertension in his father and mother.   ROS:  Please see the history of present illness.      All other systems reviewed and negative.   PHYSICAL EXAM: VS:  BP 118/64  Pulse 54  Ht  (1.905 m)  Wt 294 lb (133.358 kg)  BMI 36.75 kg/m2 Well nourished, well developed, in no acute distress HEENT: normal Neck: no JVD Cardiac:  normal S1, S2; RRR; no murmur Lungs:  clear to auscultation bilaterally, no wheezing, rhonchi or rales Abd: soft, nontender, no hepatomegaly Ext: chronic venous stasis changes Skin: warm and dry  Neuro:  CNs 2-12 intact, no focal abnormalities noted  EKG:  Sinus bradycardia with RBBB at 48bpm  ASSESSMENT AND PLAN:  1. OSA on CPAP therapy - I will get a download from his DME  2. HTN - well controlled - continue Cardizem/Lisinopril/metoprolol  3. PAF in NSR - continue metoprolol/cardizem/warfarin/Multaq 4. Chronic anticoagulation 5. Obesity - encouraged him to continue on his exercise program 6. Asymptomatic drug induced bradycardia - he is able do dance without any problems  Followup with me in 6 months      Signed, [name], MD 02/27/2014 4:01 PM

## 2014-02-27 NOTE — Patient Instructions (Signed)
Your physician recommends that you continue on your current medications as directed. Please refer to the Current Medication list given to you today.  Your physician wants you to follow-up in: 6 months with Dr Turner You will receive a reminder letter in the mail two months in advance. If you don't receive a letter, please call our office to schedule the follow-up appointment.  

## 2014-03-19 ENCOUNTER — Other Ambulatory Visit: Payer: Self-pay | Admitting: *Deleted

## 2014-03-19 MED ORDER — DRONEDARONE HCL 400 MG PO TABS: ORAL_TABLET | ORAL | Status: DC

## 2014-03-23 ENCOUNTER — Other Ambulatory Visit: Payer: Self-pay | Admitting: *Deleted

## 2014-03-23 MED ORDER — DILTIAZEM HCL ER COATED BEADS 360 MG PO CP24: 360.0000 mg | ORAL_CAPSULE | Freq: Every day | ORAL | Status: DC

## 2014-04-02 ENCOUNTER — Other Ambulatory Visit: Payer: Self-pay

## 2014-04-02 MED ORDER — METOPROLOL TARTRATE 100 MG PO TABS: ORAL_TABLET | ORAL | Status: DC

## 2014-06-20 ENCOUNTER — Encounter: Payer: Self-pay | Admitting: Cardiology

## 2014-08-30 ENCOUNTER — Ambulatory Visit (INDEPENDENT_AMBULATORY_CARE_PROVIDER_SITE_OTHER): Payer: BC Managed Care – PPO | Admitting: Cardiology

## 2014-08-30 ENCOUNTER — Encounter: Payer: Self-pay | Admitting: Cardiology

## 2014-08-30 VITALS — BP 132/70 | HR 54 | Ht 75.0 in | Wt 284.4 lb

## 2014-08-30 DIAGNOSIS — I480 Paroxysmal atrial fibrillation: Secondary | ICD-10-CM

## 2014-08-30 DIAGNOSIS — E669 Obesity, unspecified: Secondary | ICD-10-CM

## 2014-08-30 DIAGNOSIS — G4733 Obstructive sleep apnea (adult) (pediatric): Secondary | ICD-10-CM

## 2014-08-30 DIAGNOSIS — I10 Essential (primary) hypertension: Secondary | ICD-10-CM

## 2014-08-30 MED ORDER — RIVAROXABAN 20 MG PO TABS: 20.0000 mg | ORAL_TABLET | Freq: Every day | ORAL | Status: DC

## 2014-08-30 NOTE — Patient Instructions (Signed)
Pick up your Xarelto with your Copay cards. Call us back for further instruction.   Your physician wants you to follow-up in: 6 months with Dr. Mayford Knife. You will receive a reminder letter in the mail two months in advance. If you don't receive a letter, please call our office to schedule the follow-up appointment.

## 2014-08-30 NOTE — Progress Notes (Signed)
Cardiology Office Note   Date:  08/30/2014   ID:  Geoffrey Robinson, DOB 05/30/1961, MRN [id number]  PCP:  Pamelia Hoit, MD  Cardiologist:   [name], MD   Chief Complaint  Patient presents with  . Sleep Apnea  . Atrial Fibrillation  . Hypertension      History of Present Illness: Geoffrey Robinson is a 54 y.o. male with a history of severe OSA on CPAP, obesity, HTN, PAF on chronic anticoagulation who presents toady for followup. He is doing well. He denies any chest pain, SOB, DOE, palpitations, dizziness or syncope. He tolerates his CPAP device well. He tolerates his mask without problems and feels the pressure is adequate. He feels rested when he gets up in the am. He says that he gets about 5 hours of sleep nightly.  He walks 40-50 minutes and goes dancing without any problem. He has lost 10lbs since I saw him last.      Past Medical History  Diagnosis Date  . PAF (paroxysmal atrial fibrillation)   . HTN (hypertension)   . Warfarin anticoagulation     , Systemic  . Dyslipidemia   . Obesity   . Severe obstructive sleep apnea     , CPAP at 14cm H2O    Past Surgical History  Procedure Laterality Date  . Cardiac surgery    . Colonoscopy       Current Outpatient Prescriptions  Medication Sig Dispense Refill  . diltiazem (CARDIZEM CD) 360 MG 24 hr capsule Take 1 capsule (360 mg total) by mouth daily. 30 capsule 6  . dronedarone (MULTAQ) 400 MG tablet TAKE ONE TABLET BY MOUTH TWICE DAILY 60 tablet 5  . fenofibrate (TRICOR) 145 MG tablet Take 145 mg by mouth daily.    [name] lisinopril (PRINIVIL,ZESTRIL) 20 MG tablet Take 20 mg by mouth daily.    . metoprolol (LOPRESSOR) 100 MG tablet TAKE ONE TABLET BY MOUTH TWICE DAILY 60 tablet 10  . omeprazole (PRILOSEC) 40 MG capsule Take 40 mg by mouth daily.     . simvastatin (ZOCOR) 10 MG tablet Take 10 mg by mouth daily.    [name] warfarin (COUMADIN) 5 MG tablet Take 1 tablet on all days except 1.5 tablets on Sunday, or  as directed by Coumadin Clinic 40 tablet 5  . rivaroxaban (XARELTO) 20 MG TABS tablet Take 1 tablet (20 mg total) by mouth daily with supper. 30 tablet 6   No current facility-administered medications for this visit.    Allergies:   Plavix    Social History:  The patient  reports that he has never smoked. He does not have any smokeless tobacco history on file. He reports that he drinks alcohol. He reports that he does not use illicit drugs.   Family History:  The patient's family history includes Heart disease in his father; Hypertension in his father and mother.    ROS:  Please see the history of present illness.   Otherwise, review of systems are positive for none.   All other systems are reviewed and negative.    PHYSICAL EXAM: VS:  BP 132/70 mmHg  Pulse 54  Ht  (1.905 m)  Wt 284 lb 6.4 oz (129.003 kg)  BMI 35.55 kg/m2  SpO2 98% , BMI Body mass index is 35.55 kg/(m^2). GEN: Well nourished, well developed, in no acute distress HEENT: normal Neck: no JVD, carotid bruits, or masses Cardiac: RRR; no murmurs, rubs, or gallops,no edema  Respiratory:  clear to  auscultation bilaterally, normal work of breathing GI: soft, nontender, nondistended, + BS MS: no deformity or atrophy Skin: warm and dry, no rash Neuro:  Strength and sensation are intact Psych: euthymic mood, full affect   EKG:  EKG is not ordered today.    Recent Labs: No results found for requested labs within last 365 days.    Lipid Panel    Component Value Date/Time   CHOL  04/11/2009 0451    105        ATP III CLASSIFICATION:  <200     mg/dL   Desirable  103-159  mg/dL   Borderline High  >=458    mg/dL   High          TRIG 98 04/11/2009 0451   HDL 23* 04/11/2009 0451   CHOLHDL 4.6 04/11/2009 0451   VLDL 20 04/11/2009 0451   LDLCALC  04/11/2009 0451    62        Total Cholesterol/HDL:CHD Risk Coronary Heart Disease Risk Table                     Men   Women  1/2 Average Risk   3.4   3.3   Average Risk       5.0   4.4  2 X Average Risk   9.6   7.1  3 X Average Risk  23.4   11.0        Use the calculated Patient Ratio above and the CHD Risk Table to determine the patient's CHD Risk.        ATP III CLASSIFICATION (LDL):  <100     mg/dL   Optimal  592-924  mg/dL   Near or Above                    Optimal  130-159  mg/dL   Borderline  462-863  mg/dL   High  >817     mg/dL   Very High      Wt Readings from Last 3 Encounters:  08/30/14 284 lb 6.4 oz (129.003 kg)  02/27/14 294 lb (133.358 kg)  06/05/13 320 lb (145.151 kg)       ASSESSMENT AND PLAN:  1. OSA on CPAP therapy - I will get a download from his DME  2. HTN - well controlled - continue Cardizem/Lisinopril/metoprolol  - BMET/CBC/TSH results reviewed from his PCP and are normal 3. PAF in NSR - continue metoprolol/cardizem/warfarin/Multaq 4. Chronic anticoagulation - he would like to switch from coumadin to Xarelto.  I will get him set up in Coumadin clinic to make the transition 5. Obesity - encouraged him to continue on his exercise program 6. Asymptomatic drug induced bradycardia - he is able do dance without any problems  Current medicines are reviewed at length with the patient today.  The patient does not have concerns regarding medicines.  The following changes have been made:  no change  Labs/ tests ordered today include: None    Disposition:   FU with me in 6 months   Signed, [name], MD  08/30/2014 10:32 AM    [facility] [address] [place], [place], [place]  [address] Phone: [phone]; Fax: [phone]

## 2014-09-03 ENCOUNTER — Telehealth: Payer: Self-pay | Admitting: Cardiology

## 2014-09-03 NOTE — Telephone Encounter (Signed)
Will forward to Coumadin for recommendation.

## 2014-09-03 NOTE — Telephone Encounter (Signed)
Returned call to pt, pt no longer follows in Coumadin clinic for INR checks or Coumadin management.  Advised pt to call primary MD and make appt for INR check if INR 3.0 or less he can stop taking the Coumadin that evening and start taking the Xarelto.  Pt verbalized understanding INR must be 3.0 or less to start taking Xarelto and stop Coumadin.

## 2014-09-03 NOTE — Telephone Encounter (Signed)
New message     Pt c/o medication issue:  1. Name of Medication: xarelto  2. How are you currently taking this medication (dosage and times per day)? Not taking it yet  3. Are you having a reaction (difficulty breathing--STAT)? no  4. What is your medication issue?  Pt is to call when he get the xarelto filled.  He is still taking coumadin.  When should he stop coumadin and start xarelto?

## 2014-09-13 ENCOUNTER — Other Ambulatory Visit: Payer: Self-pay | Admitting: Cardiology

## 2014-10-12 ENCOUNTER — Encounter (HOSPITAL_COMMUNITY): Payer: Self-pay | Admitting: *Deleted

## 2014-10-12 ENCOUNTER — Emergency Department (HOSPITAL_COMMUNITY)
Admission: EM | Admit: 2014-10-12 | Discharge: 2014-10-12 | Disposition: A | Discharge status: Home / Self Care | Payer: BC Managed Care – PPO | Attending: Emergency Medicine | Admitting: Emergency Medicine

## 2014-10-12 DIAGNOSIS — Z7901 Long term (current) use of anticoagulants: Secondary | ICD-10-CM | POA: Diagnosis not present

## 2014-10-12 DIAGNOSIS — Z79899 Other long term (current) drug therapy: Secondary | ICD-10-CM | POA: Insufficient documentation

## 2014-10-12 DIAGNOSIS — E785 Hyperlipidemia, unspecified: Secondary | ICD-10-CM | POA: Insufficient documentation

## 2014-10-12 DIAGNOSIS — Z9981 Dependence on supplemental oxygen: Secondary | ICD-10-CM | POA: Insufficient documentation

## 2014-10-12 DIAGNOSIS — I83892 Varicose veins of left lower extremities with other complications: Secondary | ICD-10-CM

## 2014-10-12 DIAGNOSIS — Z8669 Personal history of other diseases of the nervous system and sense organs: Secondary | ICD-10-CM | POA: Insufficient documentation

## 2014-10-12 DIAGNOSIS — E669 Obesity, unspecified: Secondary | ICD-10-CM | POA: Insufficient documentation

## 2014-10-12 DIAGNOSIS — I10 Essential (primary) hypertension: Secondary | ICD-10-CM | POA: Diagnosis not present

## 2014-10-12 NOTE — ED Provider Notes (Signed)
CSN: [id number]     Arrival date & time 10/12/14  1840 History  This chart was scribed for non-physician practitioner, Ebbie Ridge, PA-C,working with Rolland Porter, MD, by Karle Plumber, ED Scribe. This patient was seen in room TR07C/TR07C and the patient's care was started at 7:21 PM.  Chief Complaint  Patient presents with  . Varicose Veins   The history is provided by the patient and medical records. No language interpreter was used.    HPI Comments:  Geoffrey Robinson is a 54 y.o. obese male with PMHx of paroxysmal atrial fibrillation on anticoagulant therapy who presents to the Emergency Department complaining of a varicose vein of the LLE that ruptured just PTA. He states he just got out of the shower and noticed the area had begun to bleed. He denies any known trauma or injury to the area. He wrapped the area with a bandage and applied pressure but reports continued active bleeding. He denies any modifying factors of the bleeding. He denies redness or warmth of the area, numbness, tingling or weakness of the LLE, nausea, vomiting, light-headedness or dizziness. PMHx of HTN, dyslipidemia and severe obstructive sleep apnea.  Past Medical History  Diagnosis Date  . PAF (paroxysmal atrial fibrillation)   . HTN (hypertension)   . Warfarin anticoagulation     , Systemic  . Dyslipidemia   . Obesity   . Severe obstructive sleep apnea     , CPAP at 14cm H2O   Past Surgical History  Procedure Laterality Date  . Cardiac surgery    . Colonoscopy     Family History  Problem Relation Age of Onset  . Hypertension Mother   . Hypertension Father   . Heart disease Father    History  Substance Use Topics  . Smoking status: Never Smoker   . Smokeless tobacco: Not on file  . Alcohol Use: Yes     Comment: occassional beer and wine    Review of Systems A complete 10 system review of systems was obtained and all systems are negative except as noted in the HPI and PMH.   Allergies   Plavix  Home Medications   Prior to Admission medications   Medication Sig Start Date End Date Taking? Authorizing Provider  diltiazem (CARDIZEM CD) 360 MG 24 hr capsule Take 1 capsule (360 mg total) by mouth daily. 03/23/14   [name], MD  fenofibrate (TRICOR) 145 MG tablet Take 145 mg by mouth daily.    Historical Provider, MD  lisinopril (PRINIVIL,ZESTRIL) 20 MG tablet Take 20 mg by mouth daily.    Historical Provider, MD  metoprolol (LOPRESSOR) 100 MG tablet TAKE ONE TABLET BY MOUTH TWICE DAILY 04/02/14   [name], MD  MULTAQ 400 MG tablet TAKE ONE TABLET BY MOUTH TWICE DAILY 09/14/14   [name], MD  omeprazole (PRILOSEC) 40 MG capsule Take 40 mg by mouth daily.     Historical Provider, MD  rivaroxaban (XARELTO) 20 MG TABS tablet Take 1 tablet (20 mg total) by mouth daily with supper. 08/30/14   [name], MD  simvastatin (ZOCOR) 10 MG tablet Take 10 mg by mouth daily.    Historical Provider, MD  warfarin (COUMADIN) 5 MG tablet Take 1 tablet on all days except 1.5 tablets on Sunday, or as directed by Coumadin Clinic 05/15/13   [name], MD   Triage Vitals: BP 125/65 mmHg  Pulse 62  Temp(Src) 98 F (36.7 C) (Oral)  Resp 20  SpO2  100% Physical Exam  Constitutional: He is oriented to person, place, and time. He appears well-developed and well-nourished.  HENT:  Head: Normocephalic and atraumatic.  Eyes: EOM are normal.  Neck: Normal range of motion.  Cardiovascular: Normal rate.   Pulmonary/Chest: Effort normal.  Musculoskeletal: Normal range of motion.  Neurological: He is alert and oriented to person, place, and time.  Skin: Skin is warm and dry.  Varicose vein noted to left medial malleolus that is currently not bleeding. Opening of the skin over the vessel where wound had been bleeding. Multiple engorged varicose veins noted to BLE.  Psychiatric: He has a normal mood and affect. His behavior is normal.  Nursing note and vitals reviewed.   ED  Course  Procedures (including critical care time) DIAGNOSTIC STUDIES: Oxygen Saturation is 100% on RA, normal by my interpretation.   COORDINATION OF CARE: 7:24 PM- Will redress wound and advised pt to follow up with PCP. Pt verbalizes understanding and agrees to plan.  Placed Surgicel on the area.  Advised patient to keep it wrapped for 24 hours.  Follow up with his primary care doctor I personally performed the services described in this documentation, which was scribed in my presence. The recorded information has been reviewed and is accurate.    Charlestine Night, PA-C 10/12/14 1956  Rolland Porter, MD 10/13/14 [phone]

## 2014-10-12 NOTE — ED Notes (Signed)
The pt has varicose veins and just pta he had one of his varicose veins suddenly started bleeding.  He had a constrictive bandage wrapped too tight .  That bandage was remove and it was still bleeding.  kerlix dressing applied pressure.  No pain he does take xarelto

## 2014-10-12 NOTE — Discharge Instructions (Signed)
Return here as needed. Follow up with your doctor. Keep the dressing in place for 24 hours

## 2014-10-16 ENCOUNTER — Telehealth: Payer: Self-pay | Admitting: *Deleted

## 2014-10-16 NOTE — Telephone Encounter (Signed)
Returning Mr. Trice phone call for an appointment.  Mr. Wende Crease is s/p EVLA  Left GSV and SP left leg12-22-2008 and s/p EVLA right GSV and SP right leg 03-14-2007 both by Dr. Josephina Gip.  Mr. Brockmeier states on 10-12-2014 he experienced left ankle hemorrhage (bleeding) following a shower.  Went to ER on 10-12-2014 and bleeding was stopped.  Reviewed how to stop bleeding if it should reoccur on left leg.  Will have scheduler make appointment to see Dr. Hart Rochester and have left LE venous reflux study and call Mr. Riester with appointment dates and times.  Mr. Marling appears reassured.

## 2014-10-16 NOTE — Telephone Encounter (Signed)
Scheduled appointment for 11/20/14 @830am  with patient, dpm

## 2014-10-18 ENCOUNTER — Other Ambulatory Visit: Payer: Self-pay | Admitting: Cardiology

## 2014-10-23 ENCOUNTER — Other Ambulatory Visit: Payer: Self-pay | Admitting: *Deleted

## 2014-10-23 DIAGNOSIS — I83892 Varicose veins of left lower extremities with other complications: Secondary | ICD-10-CM

## 2014-11-16 ENCOUNTER — Encounter: Payer: Self-pay | Admitting: Vascular Surgery

## 2014-11-20 ENCOUNTER — Ambulatory Visit (HOSPITAL_COMMUNITY)
Admission: RE | Admit: 2014-11-20 | Discharge: 2014-11-20 | Disposition: A | Discharge status: Home / Self Care | Payer: BC Managed Care – PPO | Source: Ambulatory Visit | Attending: Vascular Surgery | Admitting: Vascular Surgery

## 2014-11-20 ENCOUNTER — Ambulatory Visit (INDEPENDENT_AMBULATORY_CARE_PROVIDER_SITE_OTHER): Payer: BC Managed Care – PPO | Admitting: Vascular Surgery

## 2014-11-20 ENCOUNTER — Encounter: Payer: Self-pay | Admitting: Vascular Surgery

## 2014-11-20 VITALS — BP 126/71 | HR 45 | Resp 16 | Ht 75.0 in | Wt 275.0 lb

## 2014-11-20 DIAGNOSIS — I83892 Varicose veins of left lower extremities with other complications: Secondary | ICD-10-CM | POA: Diagnosis not present

## 2014-11-20 DIAGNOSIS — I83893 Varicose veins of bilateral lower extremities with other complications: Secondary | ICD-10-CM | POA: Diagnosis not present

## 2014-11-20 DIAGNOSIS — I83899 Varicose veins of unspecified lower extremities with other complications: Secondary | ICD-10-CM | POA: Insufficient documentation

## 2014-11-20 NOTE — Progress Notes (Signed)
Subjective:     Patient ID: Geoffrey Robinson, male   DOB: Sep 09, 1960, 54 y.o.   MRN: [id number]  HPI this 54 year old male presents with painful varicosities both lower extremities and a recent spontaneous bleed from a varix in the left ankle area which required a trip to the emergency department. This was a spontaneous bleed with no trauma to the leg. He has no history of DVT but does have a remote history of superficial thrombophlebitis. He has had previous laser ablation of both legs and the great saphenous systems in 2009. He also has a remote history of a stripping type procedure and the right leg many years ago. He has developed recurrent large bulging painful varicosities in both legs left worse than right as well as progressive edema and darkening of skin but no history of stasis ulcers. He does not were elastic compression stockings on a regular basis. He does have increasing pain and swelling as the day progresses. This is affecting his daily living and he is concerned about further bleeds.  Past Medical History  Diagnosis Date  . PAF (paroxysmal atrial fibrillation)   . HTN (hypertension)   . Warfarin anticoagulation     , Systemic  . Dyslipidemia   . Obesity   . Severe obstructive sleep apnea     , CPAP at 14cm H2O    History  Substance Use Topics  . Smoking status: Never Smoker   . Smokeless tobacco: Not on file  . Alcohol Use: Yes     Comment: occassional beer and wine    Family History  Problem Relation Age of Onset  . Hypertension Mother   . Hypertension Father   . Heart disease Father     Allergies  Allergen Reactions  . Plavix [Clopidogrel Bisulfate] Rash     Current outpatient prescriptions:  .  aspirin 325 MG tablet, Take 325 mg by mouth daily., Disp: , Rfl:  .  diltiazem (CARDIZEM CD) 360 MG 24 hr capsule, TAKE ONE CAPSULE BY MOUTH ONE TIME DAILY, Disp: 30 capsule, Rfl: 5 .  fenofibrate (TRICOR) 145 MG tablet, Take 145 mg by mouth daily., Disp: , Rfl:  .   lisinopril (PRINIVIL,ZESTRIL) 20 MG tablet, Take 20 mg by mouth daily., Disp: , Rfl:  .  metoprolol (LOPRESSOR) 100 MG tablet, TAKE ONE TABLET BY MOUTH TWICE DAILY, Disp: 60 tablet, Rfl: 10 .  MULTAQ 400 MG tablet, TAKE ONE TABLET BY MOUTH TWICE DAILY, Disp: 60 tablet, Rfl: 4 .  omeprazole (PRILOSEC) 40 MG capsule, Take 40 mg by mouth daily. , Disp: , Rfl:  .  rivaroxaban (XARELTO) 20 MG TABS tablet, Take 1 tablet (20 mg total) by mouth daily with supper., Disp: 30 tablet, Rfl: 6 .  simvastatin (ZOCOR) 10 MG tablet, Take 10 mg by mouth daily., Disp: , Rfl:  .  warfarin (COUMADIN) 5 MG tablet, Take 1 tablet on all days except 1.5 tablets on Sunday, or as directed by Coumadin Clinic (Patient not taking: Reported on 11/20/2014), Disp: 40 tablet, Rfl: 5  Filed Vitals:   11/20/14 0908  BP: 126/71  Pulse: 45  Resp: 16  Height: 6\' 3"  (1.905 m)  Weight: 275 lb (124.739 kg)    Body mass index is 34.37 kg/(m^2).         Review of Systems chest pain, dyspnea on exertion, PND, orthopnea, hemoptysis, claudication. Patient does have a history of atrial fibrillation with 3 previous ablation procedures and currently is on Xeralto     Objective:  Physical Exam BP 126/71 mmHg  Pulse 45  Resp 16  Ht  (1.905 m)  Wt 275 lb (124.739 kg)  BMI 34.37 kg/m2  Gen.-alert and oriented x3 in no apparent distress HEENT normal for age Lungs no rhonchi or wheezing Cardiovascular regular rhythm no murmurs carotid pulses 3+ palpable no bruits audible Abdomen soft nontender no palpable masses Musculoskeletal free of  major deformities Skin clear -severe hyperpigmentation lower third both legs with no active ulceration Neurologic normal Lower extremities 3+ femoral and dorsalis pedis pulses palpable bilaterally -1-2+ chronic edema bilaterally right worse than left. Left leg with large bulging varicosities proximal third medial thigh extending down medial thigh in the medial calf with some superficial  subcutaneous cuticular reticular veins which occur. Quite thin as well as the previous bleeding site near the medial malleolus. Posterior calf varicosities left leg Right leg with diffuse varicosities medial thigh medial calf but no active ulcer.  Today I ordered a venous duplex exam the left leg which I reviewed and interpreted. The left small saphenous is large and has gross reflux throughout. The left great saphenous is patent in the proximal third of the thigh and is large with high flow and gross reflux supplying the bulging varicosities in the thigh and calf area. There is no DVT. Right leg was not done today.       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 and proximal left great saphenous vein as well as calf area with recent spontaneous bleed left ankle from superficial varix    Plan:     Patient needs #1 laser ablation left small saphenous vein followed by #2 laser ablation left great saphenous vein with greater than 20 stab phlebectomy followed by #32 courses of sclerotherapy for the bleeding site and the subcutaneous thin varicosities Patient needs to return in 3 months after wearing elastic compression stockings and    get formal gross will get formal reflux study of contralateral right leg before making recommendations on that side Do believe we should proceed with a left leg without a three-month waiting. Because of the recent spontaneous bleed requiring trip to emergency department

## 2014-11-20 NOTE — Addendum Note (Signed)
Addended by: Sharee Pimple on: 11/20/2014 09:55 AM   Modules accepted: Orders

## 2014-11-21 ENCOUNTER — Other Ambulatory Visit: Payer: Self-pay | Admitting: *Deleted

## 2014-11-21 DIAGNOSIS — I83892 Varicose veins of left lower extremities with other complications: Secondary | ICD-10-CM

## 2014-11-30 ENCOUNTER — Encounter: Payer: Self-pay | Admitting: Cardiology

## 2014-12-04 ENCOUNTER — Telehealth: Payer: Self-pay | Admitting: Cardiology

## 2014-12-04 ENCOUNTER — Telehealth: Payer: Self-pay | Admitting: *Deleted

## 2014-12-04 NOTE — Telephone Encounter (Signed)
Will fax Dr. Norris Cross response over to Marisue Ivan at vascular/vein.

## 2014-12-04 NOTE — Telephone Encounter (Signed)
Pt received my call about contacting his cardiologist regarding stopping his Xarelto three days prior to his procedure and starting it again two days later.

## 2014-12-04 NOTE — Telephone Encounter (Signed)
New message      Request for surgical clearance:  1. What type of surgery is being performed? Laser ablation of his great saphenous vein  When is this surgery scheduled? 12-17-14 2. Are there any medications that need to be held prior to surgery and how long?  xarelto---they normally hold it 3 days prior and restart 2 days after---is this ok?  3. Name of physician performing surgery? Dr Hart Rochester  4. What is your office phone and fax number?  Fax [phone]

## 2014-12-04 NOTE — Telephone Encounter (Signed)
OK to hold Xarelto for 3 days prior and 2 days after vein procedure

## 2014-12-10 ENCOUNTER — Other Ambulatory Visit: Payer: BC Managed Care – PPO | Admitting: Vascular Surgery

## 2014-12-12 ENCOUNTER — Encounter: Payer: Self-pay | Admitting: Vascular Surgery

## 2014-12-17 ENCOUNTER — Ambulatory Visit: Payer: BC Managed Care – PPO | Admitting: Vascular Surgery

## 2014-12-17 ENCOUNTER — Ambulatory Visit (INDEPENDENT_AMBULATORY_CARE_PROVIDER_SITE_OTHER): Payer: BC Managed Care – PPO | Admitting: Vascular Surgery

## 2014-12-17 ENCOUNTER — Other Ambulatory Visit: Payer: Self-pay | Admitting: *Deleted

## 2014-12-17 ENCOUNTER — Encounter (HOSPITAL_COMMUNITY): Payer: BC Managed Care – PPO

## 2014-12-17 ENCOUNTER — Encounter: Payer: Self-pay | Admitting: Vascular Surgery

## 2014-12-17 VITALS — BP 113/67 | HR 49 | Resp 16 | Ht 75.0 in | Wt 275.0 lb

## 2014-12-17 DIAGNOSIS — I83893 Varicose veins of bilateral lower extremities with other complications: Secondary | ICD-10-CM

## 2014-12-17 DIAGNOSIS — I83892 Varicose veins of left lower extremities with other complications: Secondary | ICD-10-CM

## 2014-12-17 NOTE — Progress Notes (Signed)
Laser Ablation Procedure    Date: 12/17/2014   Geoffrey Robinson DOB:April 30, 1961  Consent signed: Yes    Surgeon:  Dr. Quita Skye. Hart Rochester  Procedure: Laser Ablation: left Small Saphenous Vein   Dr. Hart Rochester  BP 113/67 mmHg  Pulse 49  Resp 16  Ht 6\' 3"  (1.905 m)  Wt 275 lb (124.739 kg)  BMI 34.37 kg/m2  Tumescent Anesthesia: 250 cc 0.9% NaCl with 50 cc Lidocaine HCL with 1% Epi and 15 cc 8.4% NaHCO3  Local Anesthesia: 3 cc Lidocaine HCL and NaHCO3 (ratio 2:1)  Pulsed Mode: 15 watts, delay, 1.0 duration  Total Energy:     1541         Total Pulses:    91            Total Time: 1:30  Sclerotherapy: .3 %Sotradecol. Patient received a total of 6 cc    Patient tolerated procedure well  Notes:   Description of Procedure:  After marking the course of the secondary varicosities, the patient was placed on the operating table in the prone position, and the left leg was prepped and draped in sterile fashion.   Local anesthetic was administered and under ultrasound guidance the saphenous vein was accessed with a micro needle and guide wire; then the mirco puncture sheath was placed.  A guide wire was inserted saphenopopliteal junction , followed by a 5 french sheath.  The position of the sheath and then the laser fiber below the junction was confirmed using the ultrasound.  Tumescent anesthesia was administered along the course of the saphenous vein using ultrasound guidance. The patient was placed in Trendelenburg position and protective laser glasses were placed on patient and staff, and the laser was fired at 15 watts continuous mode advancing 1-59mm/second for a total of 1541 joules.     Sclerotherapy was performed to varicosities using 6  cc .3% Sotradecol foam via a 27g butterfly needle.  Steri strips were applied to the stab wounds and ABD pads and thigh high compression stockings were applied.  Ace wrap bandages were applied over the phlebectomy sites and at the top of the  saphenopopliteal junction. Blood loss was less than 15 cc.  The patient ambulated out of the operating room having tolerated the procedure well.

## 2014-12-17 NOTE — Progress Notes (Signed)
Subjective:     Patient ID: [name], male   DOB: 11/27/1960, 54 y.o.   MRN: [id number]  HPI this 54 year old male had laser ablation of the left small saphenous vein performed under local tumescent anesthesia. A total of 1500 J of energy was utilized. He tolerated the procedure well. He had a recent bleed in the left ankle area and he also had sclerotherapy performed in this area.   Review of Systems     Objective:   Physical Exam BP 113/67 mmHg  Pulse 49  Resp 16  Ht 6\' 3"  (1.905 m)  Wt 275 lb (124.739 kg)  BMI 34.37 kg/m2       Assessment:     Well-tolerated laser ablation left small saphenous vein for gross reflux with venous hypertension and recent bleeding varix. Patient also had sclerotherapy at the bleeding site.    Plan:     Patient will return in the next 2 weeks for ultrasound to confirm closure left small saphenous vein. He will then be scheduled for laser ablation left great saphenous vein and multiple stab phlebectomy

## 2014-12-18 ENCOUNTER — Telehealth: Payer: Self-pay | Admitting: *Deleted

## 2014-12-18 NOTE — Telephone Encounter (Signed)
Pt doing well. Having no problems with the leg where the ablation was performed. Tols him it would be ok to loosen the ace wraps. Following all other instructions.

## 2014-12-28 ENCOUNTER — Encounter: Payer: Self-pay | Admitting: Vascular Surgery

## 2014-12-31 ENCOUNTER — Encounter (HOSPITAL_COMMUNITY): Payer: BC Managed Care – PPO

## 2014-12-31 ENCOUNTER — Ambulatory Visit: Payer: BC Managed Care – PPO | Admitting: Vascular Surgery

## 2015-01-01 ENCOUNTER — Encounter: Payer: Self-pay | Admitting: Vascular Surgery

## 2015-01-01 ENCOUNTER — Ambulatory Visit (HOSPITAL_COMMUNITY)
Admission: RE | Admit: 2015-01-01 | Discharge: 2015-01-01 | Disposition: A | Discharge status: Home / Self Care | Payer: BC Managed Care – PPO | Source: Ambulatory Visit | Attending: Vascular Surgery | Admitting: Vascular Surgery

## 2015-01-01 ENCOUNTER — Ambulatory Visit (INDEPENDENT_AMBULATORY_CARE_PROVIDER_SITE_OTHER): Payer: BC Managed Care – PPO | Admitting: Vascular Surgery

## 2015-01-01 VITALS — BP 110/66 | HR 48 | Resp 16 | Ht 75.0 in | Wt 275.0 lb

## 2015-01-01 DIAGNOSIS — Z9889 Other specified postprocedural states: Secondary | ICD-10-CM | POA: Insufficient documentation

## 2015-01-01 DIAGNOSIS — I83892 Varicose veins of left lower extremities with other complications: Secondary | ICD-10-CM | POA: Diagnosis not present

## 2015-01-01 NOTE — Progress Notes (Signed)
Subjective:     Patient ID: Geoffrey Robinson, male   DOB: 1960-08-02, 54 y.o.   MRN: [id number]  HPI this 54 year old male returns 1 week post-laser ablation left small saphenous vein for gross reflux with painful varicosities. He has had no change in distal edema. He denies severe pain in the proximal posterior calf over the ablation site. He has worn his elastic compression stockings as instructed. He continues to have painful varicosities in the anterior proximal medial thigh and medial calf area.  Past Medical History  Diagnosis Date  . PAF (paroxysmal atrial fibrillation)   . HTN (hypertension)   . Warfarin anticoagulation     , Systemic  . Dyslipidemia   . Obesity   . Severe obstructive sleep apnea     , CPAP at 14cm H2O  . Varicose veins     History  Substance Use Topics  . Smoking status: Never Smoker   . Smokeless tobacco: Never Used  . Alcohol Use: 0.0 oz/week    0 Standard drinks or equivalent per week     Comment: occassional beer and wine    Family History  Problem Relation Age of Onset  . Hypertension Mother   . Hypertension Father   . Heart disease Father     Allergies  Allergen Reactions  . Plavix [Clopidogrel Bisulfate] Rash     Current outpatient prescriptions:  .  aspirin 325 MG tablet, Take 325 mg by mouth daily., Disp: , Rfl:  .  diltiazem (CARDIZEM CD) 360 MG 24 hr capsule, TAKE ONE CAPSULE BY MOUTH ONE TIME DAILY, Disp: 30 capsule, Rfl: 5 .  fenofibrate (TRICOR) 145 MG tablet, Take 145 mg by mouth daily., Disp: , Rfl:  .  lisinopril (PRINIVIL,ZESTRIL) 20 MG tablet, Take 20 mg by mouth daily., Disp: , Rfl:  .  metoprolol (LOPRESSOR) 100 MG tablet, TAKE ONE TABLET BY MOUTH TWICE DAILY, Disp: 60 tablet, Rfl: 10 .  MULTAQ 400 MG tablet, TAKE ONE TABLET BY MOUTH TWICE DAILY, Disp: 60 tablet, Rfl: 4 .  omeprazole (PRILOSEC) 40 MG capsule, Take 40 mg by mouth daily. , Disp: , Rfl:  .  rivaroxaban (XARELTO) 20 MG TABS tablet, Take 1 tablet (20 mg total)  by mouth daily with supper., Disp: 30 tablet, Rfl: 6 .  simvastatin (ZOCOR) 10 MG tablet, Take 10 mg by mouth daily., Disp: , Rfl:  .  warfarin (COUMADIN) 5 MG tablet, Take 1 tablet on all days except 1.5 tablets on Sunday, or as directed by [facility] (Patient not taking: Reported on 01/01/2015), Disp: 40 tablet, Rfl: 5  Filed Vitals:   01/01/15 1455  BP: 110/66  Pulse: 48  Resp: 16  Height: 6\' 3"  (1.905 m)  Weight: 275 lb (124.739 kg)    Body mass index is 34.37 kg/(m^2).         Review of Systems denies chest pain, dyspnea on exertion, PND, orthopnea, hemoptysis     Objective:   Physical Exam BP 110/66 mmHg  Pulse 48  Resp 16  Ht 6\' 3"  (1.905 m)  Wt 275 lb (124.739 kg)  BMI 34.37 kg/m2  Gen. well-developed well-nourished male in no apparent distress alert and oriented 3 Lungs no rhonchi or wheezing Cardiovascular regular rhythm no murmurs Left leg with mild discomfort to deep palpation over small saphenous vein and proximal calf. Chronic hyperpigmentation and remaining bulging varicosities noted in the anterior proximal thigh and medial thigh area. 3+ to Sal[place] pedis pulse palpable.  Today I ordered a venous  duplex exam of the left leg which I reviewed and interpreted. There is no DVT. The left small saphenous vein is totally closed up to near the saphenous popliteal junction     Assessment:     Successful laser ablation left small saphenous vein for gross reflux with painful varicosities and sclerotherapy of previous bleeding site near left medial malleolus    Plan:     Patient will return in a few weeks for laser ablation left great saphenous vein plus greater than 20 stab phlebectomy of painful varicosities

## 2015-02-08 ENCOUNTER — Encounter: Payer: Self-pay | Admitting: Vascular Surgery

## 2015-02-11 ENCOUNTER — Ambulatory Visit (INDEPENDENT_AMBULATORY_CARE_PROVIDER_SITE_OTHER): Payer: BC Managed Care – PPO | Admitting: Vascular Surgery

## 2015-02-11 ENCOUNTER — Encounter: Payer: Self-pay | Admitting: Vascular Surgery

## 2015-02-11 VITALS — BP 120/61 | HR 100 | Temp 98.3°F | Resp 16 | Ht 75.0 in | Wt 275.0 lb

## 2015-02-11 DIAGNOSIS — I83893 Varicose veins of bilateral lower extremities with other complications: Secondary | ICD-10-CM

## 2015-02-11 NOTE — Progress Notes (Signed)
Subjective:     Patient ID: Geoffrey Robinson, male   DOB: 06/13/1961, 54 y.o.   MRN: [id number]  HPI this 54 year old male had laser ablation of the left great saphenous vein from the mid thigh to near the saphenofemoral junction plus greater than 20 stab phlebectomy of large painful varicosities performed under local tumescent anesthesia. Total of 1323 J of energy was utilized. He tolerated the procedure well. The vein was a large caliber vein being up to a centimeter or larger in some areas.   Review of Systems     Objective:   Physical Exam BP 120/61 mmHg  Pulse 100  Temp(Src) 98.3 F (36.8 C)  Resp 16  Ht 6\' 3"  (1.905 m)  Wt 275 lb (124.739 kg)  BMI 34.37 kg/m2  SpO2 100%       Assessment:     Well-tolerated laser ablation left great saphenous vein from proximal third of thigh to near the saphenofemoral junction plus greater than 20 stab phlebectomy of large painful varicosities performed and local tumescent anesthesia    Plan:     Return in 1 week for venous duplex exam confirmed closure left great saphenous vein

## 2015-02-11 NOTE — Progress Notes (Signed)
Laser Ablation Procedure    Date: 02/11/2015   ILLIAM GUIMOND DOB:31-Aug-1960  Consent signed: Yes    Surgeon:  Dr. Quita Skye. [facility] [place]  Procedure: Laser Ablation: left Greater Saphenous Vein  BP 120/61 mmHg  Pulse 100  Temp(Src) 98.3 F (36.8 C)  Resp 16  Ht 6\' 3"  (1.905 m)  Wt 275 lb (124.739 kg)  BMI 34.37 kg/m2  SpO2 100%  Tumescent Anesthesia: 400 cc 0.9% NaCl with 50 cc Lidocaine HCL with 1% Epi and 15 cc 8.4% NaHCO3  Local Anesthesia: 3 cc Lidocaine HCL and NaHCO3 (ratio 2:1)  Pulsed Mode: 17 watts, delay, 1.0 duration  Total Energy: 1323             Total Pulses:  78              Total Time: 1:18    Stab Phlebectomy: >20 Sites: Thigh, Calf and Ankle  Patient tolerated procedure well  Notes:   Description of Procedure:  After marking the course of the secondary varicosities, the patient was placed on the operating table in the supine position, and the left leg was prepped and draped in sterile fashion.   Local anesthetic was administered and under ultrasound guidance the saphenous vein was accessed with a micro needle and guide wire; then the mirco puncture sheath was placed.  A guide wire was inserted saphenofemoral junction , followed by a 5 french sheath.  The position of the sheath and then the laser fiber below the junction was confirmed using the ultrasound.  Tumescent anesthesia was administered along the course of the saphenous vein using ultrasound guidance. The patient was placed in Trendelenburg position and protective laser glasses were placed on patient and staff, and the laser was fired at 15 watts continuous mode advancing 1-81mm/second for a total of 1323 joules.   For stab phlebectomies, local anesthetic was administered at the previously marked varicosities, and tumescent anesthesia was administered around the vessels.  Greater than 20 stab wounds were made using the tip of an 11 blade. And using the vein hook, the phlebectomies were performed using a  hemostat to avulse the varicosities.  Adequate hemostasis was achieved.     Steri strips were applied to the stab wounds and ABD pads and thigh high compression stockings were applied.  Ace wrap bandages were applied over the phlebectomy sites and at the top of the saphenofemoral junction. Blood loss was less than 15 cc.  The patient ambulated out of the operating room having tolerated the procedure well.

## 2015-02-12 ENCOUNTER — Encounter: Payer: Self-pay | Admitting: Vascular Surgery

## 2015-02-12 ENCOUNTER — Telehealth: Payer: Self-pay | Admitting: *Deleted

## 2015-02-12 NOTE — Telephone Encounter (Signed)
Pt doing well. No bleeding. Following all instructions.

## 2015-02-15 ENCOUNTER — Encounter: Payer: Self-pay | Admitting: Vascular Surgery

## 2015-02-18 ENCOUNTER — Encounter: Payer: Self-pay | Admitting: Vascular Surgery

## 2015-02-18 ENCOUNTER — Ambulatory Visit (INDEPENDENT_AMBULATORY_CARE_PROVIDER_SITE_OTHER): Payer: BC Managed Care – PPO | Admitting: Vascular Surgery

## 2015-02-18 ENCOUNTER — Ambulatory Visit (HOSPITAL_COMMUNITY)
Admission: RE | Admit: 2015-02-18 | Discharge: 2015-02-18 | Disposition: A | Discharge status: Home / Self Care | Payer: BC Managed Care – PPO | Source: Ambulatory Visit | Attending: Vascular Surgery | Admitting: Vascular Surgery

## 2015-02-18 VITALS — BP 114/65 | HR 48 | Ht 75.0 in | Wt 278.0 lb

## 2015-02-18 DIAGNOSIS — Z7901 Long term (current) use of anticoagulants: Secondary | ICD-10-CM | POA: Insufficient documentation

## 2015-02-18 DIAGNOSIS — I83892 Varicose veins of left lower extremities with other complications: Secondary | ICD-10-CM | POA: Insufficient documentation

## 2015-02-18 DIAGNOSIS — I10 Essential (primary) hypertension: Secondary | ICD-10-CM | POA: Insufficient documentation

## 2015-02-18 NOTE — Addendum Note (Signed)
Addended by: Adria Dill L on: 02/18/2015 04:56 PM   Modules accepted: Orders

## 2015-02-18 NOTE — Progress Notes (Addendum)
Subjective:     Patient ID: Geoffrey Robinson, male   DOB: [birth year], 54 y.o.   MRN: [id number]  HPI this 54 year old male returns 1 week post-laser ablation left great saphenous vein plus multiple stab phlebectomy of painful varicosities. He has had mild discomfort in the left medial thigh. He has noticed the ankle and foot being less tight than previously. He's had no pain in the stab phlebectomy sites. He has taken ibuprofen and one the stocking as instructed. He also continues to have pain and swelling in the right lower extremity despite wearing long leg elastic compression stockings 20-30 millimeter gradient and trying elevation and ibuprofen. This has been done since May 31 of 2016. He has a remote history of laser ablation right great saphenous vein with stab phlebectomy in 2008 by me. His symptoms in the right leg are affecting his daily living. Review of Systems     Objective:   Physical Exam BP 114/65 mmHg  Pulse 48  Ht 6\' 3"  (1.905 m)  Wt 278 lb (126.1 kg)  BMI 34.75 kg/m2  SpO2 98%  Gen. well-developed well-nourished male in no apparent distress alert and oriented 3 Lungs no rhonchi or wheezing Left leg with mild discomfort to palpation over great saphenous vein and medial thigh up to the inguinal crease. Stab phlebectomy sites and thigh and calf are well healed. Minimal distal edema noted although patient does have significant hyperpigmentation and chronic skin thickening. 3 posterior cells pedis pulse palpable.  Today I ordered a venous duplex exam the left leg which I reviewed and interpreted. There is no DVT. There is total closure of the left great saphenous vein from mid thigh to within 1 cm of the saphenofemoral junction.     Assessment:     Successful laser ablation left great saphenous vein with multiple stab phlebectomy of painful varicosities Painful varicosities right leg with remote history of treatment of great saphenous vein with stab phlebectomy now with painful  varicosities which are recurrent. These are affecting patient's daily living and have not responded to conservative measures including long leg elastic compression stockings 20-30 millimeter gradient, elevation, and ibuprofen.    Plan:     Return in a few weeks with a venous duplex exam for reflux of right leg because of chronic pain and edema with recurrent varicosities and will then make recommendation regarding right-sided that time Patient will wear long leg elastic compression stockings 20-30 mm gradient and try elevation ibuprofen over this time.

## 2015-02-19 ENCOUNTER — Other Ambulatory Visit: Payer: Self-pay | Admitting: Cardiology

## 2015-02-19 ENCOUNTER — Ambulatory Visit: Payer: BC Managed Care – PPO | Admitting: Vascular Surgery

## 2015-02-19 ENCOUNTER — Encounter (HOSPITAL_COMMUNITY): Payer: BC Managed Care – PPO

## 2015-02-19 ENCOUNTER — Other Ambulatory Visit: Payer: Self-pay | Admitting: *Deleted

## 2015-02-19 DIAGNOSIS — I83812 Varicose veins of left lower extremities with pain: Secondary | ICD-10-CM

## 2015-03-08 ENCOUNTER — Encounter: Payer: Self-pay | Admitting: Vascular Surgery

## 2015-03-11 ENCOUNTER — Encounter: Payer: Self-pay | Admitting: Vascular Surgery

## 2015-03-11 ENCOUNTER — Encounter: Payer: Self-pay | Admitting: *Deleted

## 2015-03-11 ENCOUNTER — Ambulatory Visit (HOSPITAL_COMMUNITY)
Admission: RE | Admit: 2015-03-11 | Discharge: 2015-03-11 | Disposition: A | Discharge status: Home / Self Care | Payer: BC Managed Care – PPO | Source: Ambulatory Visit | Attending: Vascular Surgery | Admitting: Vascular Surgery

## 2015-03-11 ENCOUNTER — Ambulatory Visit (INDEPENDENT_AMBULATORY_CARE_PROVIDER_SITE_OTHER): Payer: Self-pay | Admitting: Vascular Surgery

## 2015-03-11 VITALS — BP 122/65 | HR 50 | Temp 97.5°F | Resp 18 | Ht 75.0 in | Wt 281.0 lb

## 2015-03-11 DIAGNOSIS — R609 Edema, unspecified: Secondary | ICD-10-CM | POA: Insufficient documentation

## 2015-03-11 DIAGNOSIS — I83812 Varicose veins of left lower extremities with pain: Secondary | ICD-10-CM | POA: Diagnosis present

## 2015-03-11 DIAGNOSIS — I83893 Varicose veins of bilateral lower extremities with other complications: Secondary | ICD-10-CM

## 2015-03-11 DIAGNOSIS — I83899 Varicose veins of unspecified lower extremities with other complications: Secondary | ICD-10-CM | POA: Insufficient documentation

## 2015-03-11 NOTE — Progress Notes (Signed)
Subjective:     Patient ID: Geoffrey Robinson, male   DOB: 1961/01/08, 54 y.o.   MRN: [id number]  HPI this 54 year old male had laser ablation of the left great saphenous vein with multiple stab phlebectomy performed about 4 weeks ago. He is doing well from that standpoint with improvement in pain and swelling. He has a remote history of vein stripping of the right great saphenous vein followed several years later by laser ablation of the anterior accessory branch of the right great saphenous vein. He does have some recurrent varicosities in the right medial thigh which are not terribly painful at the present time. He does have chronic edema with varicosities in the calf area. He has no history of stasis ulcers recently.  Past Medical History  Diagnosis Date  . PAF (paroxysmal atrial fibrillation)   . HTN (hypertension)   . Warfarin anticoagulation     , Systemic  . Dyslipidemia   . Obesity   . Severe obstructive sleep apnea     , CPAP at 14cm H2O  . Varicose veins     Social History  Substance Use Topics  . Smoking status: Never Smoker   . Smokeless tobacco: Never Used  . Alcohol Use: 0.0 oz/week    0 Standard drinks or equivalent per week     Comment: occassional beer and wine    Family History  Problem Relation Age of Onset  . Hypertension Mother   . Hypertension Father   . Heart disease Father     Allergies  Allergen Reactions  . Plavix [Clopidogrel Bisulfate] Rash     Current outpatient prescriptions:  .  aspirin 325 MG tablet, Take 325 mg by mouth daily., Disp: , Rfl:  .  diltiazem (CARDIZEM CD) 360 MG 24 hr capsule, TAKE ONE CAPSULE BY MOUTH ONE TIME DAILY, Disp: 30 capsule, Rfl: 5 .  fenofibrate (TRICOR) 145 MG tablet, Take 145 mg by mouth daily., Disp: , Rfl:  .  lisinopril (PRINIVIL,ZESTRIL) 20 MG tablet, Take 20 mg by mouth daily., Disp: , Rfl:  .  metoprolol (LOPRESSOR) 100 MG tablet, TAKE ONE TABLET BY MOUTH TWICE DAILY, Disp: 60 tablet, Rfl: 10 .  MULTAQ 400  MG tablet, TAKE ONE TABLET BY MOUTH TWICE DAILY, Disp: 60 tablet, Rfl: 0 .  omeprazole (PRILOSEC) 40 MG capsule, Take 40 mg by mouth daily. , Disp: , Rfl:  .  rivaroxaban (XARELTO) 20 MG TABS tablet, Take 1 tablet (20 mg total) by mouth daily with supper., Disp: 30 tablet, Rfl: 6 .  simvastatin (ZOCOR) 10 MG tablet, Take 10 mg by mouth daily., Disp: , Rfl:  .  warfarin (COUMADIN) 5 MG tablet, Take 1 tablet on all days except 1.5 tablets on Sunday, or as directed by [facility] (Patient not taking: Reported on 01/01/2015), Disp: 40 tablet, Rfl: 5  Filed Vitals:   03/11/15 1326  BP: 122/65  Pulse: 50  Temp: 97.5 F (36.4 C)  Resp: 18  Height:  (1.905 m)  Weight: 281 lb (127.461 kg)  SpO2: 100%    Body mass index is 35.12 kg/(m^2).           Review of Systems     Objective:   Physical Exam BP 122/65 mmHg  Pulse 50  Temp(Src) 97.5 F (36.4 C)  Resp 18  Ht  (1.905 m)  Wt 281 lb (127.461 kg)  BMI 35.12 kg/m2  SpO2 100%  Gen. well-developed well-nourished male in no apparent distress alert and oriented 3  Right leg with chronic 1+ edema. No large bulging varicosities noted. No active ulcer noted 2+ dorsalis pedis pulse palpable. 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 1-2+ chronic edema below the knee. No active ulcer. 2+ dorsalis pedis pulse palpable.  Today I ordered a venous reflux exam of the right leg which I reviewed and interpreted. There is no DVT. There is gross reflux in the right small saphenous vein supplying some bulging varicosities in the mid calf. There is no patent great saphenous vein or anterior accessory branch of the great saphenous vein on the right which has been previously treated.     Assessment:     Successful previous laser ablation right great saphenous vein and vein stripping remotely as well as recent left great saphenous vein with multiple stab phlebectomy.    Plan:      Patient to return in the near future for scheduled sclerotherapy to complete treatment of left leg We'll not recommend any formal treatment of the right leg now unless this becomes increasingly symptomatic That becomes the case then patient is candidate for laser ablation right small saphenous vein with multiple stab phlebectomy

## 2015-03-13 ENCOUNTER — Ambulatory Visit (INDEPENDENT_AMBULATORY_CARE_PROVIDER_SITE_OTHER): Payer: BC Managed Care – PPO | Admitting: *Deleted

## 2015-03-13 DIAGNOSIS — I83893 Varicose veins of bilateral lower extremities with other complications: Secondary | ICD-10-CM | POA: Diagnosis not present

## 2015-03-13 DIAGNOSIS — I83892 Varicose veins of left lower extremities with other complications: Secondary | ICD-10-CM

## 2015-03-13 NOTE — Progress Notes (Signed)
X=.3% Sotradecol administered with a 27g butterfly.  Patient received a total of 12cc.  Treated all areas of concern on the left lower leg. Easy acces. Tol well. Will follow prn.  Photos: No.  Compression stockings applied: Yes.

## 2015-03-18 ENCOUNTER — Ambulatory Visit (INDEPENDENT_AMBULATORY_CARE_PROVIDER_SITE_OTHER): Payer: BC Managed Care – PPO | Admitting: Cardiology

## 2015-03-18 ENCOUNTER — Encounter: Payer: Self-pay | Admitting: Cardiology

## 2015-03-18 VITALS — BP 100/56 | HR 50 | Ht 75.0 in | Wt 283.2 lb

## 2015-03-18 DIAGNOSIS — I480 Paroxysmal atrial fibrillation: Secondary | ICD-10-CM

## 2015-03-18 DIAGNOSIS — G4733 Obstructive sleep apnea (adult) (pediatric): Secondary | ICD-10-CM | POA: Diagnosis not present

## 2015-03-18 DIAGNOSIS — I10 Essential (primary) hypertension: Secondary | ICD-10-CM

## 2015-03-18 DIAGNOSIS — E669 Obesity, unspecified: Secondary | ICD-10-CM | POA: Diagnosis not present

## 2015-03-18 LAB — CBC WITH DIFFERENTIAL/PLATELET
BASOS PCT: 0.5 % (ref 0.0–3.0)
Basophils Absolute: 0 10*3/uL (ref 0.0–0.1)
EOS PCT: 8.4 % — AB (ref 0.0–5.0)
Eosinophils Absolute: 0.6 10*3/uL (ref 0.0–0.7)
HCT: 38.2 % — ABNORMAL LOW (ref 39.0–52.0)
Hemoglobin: 12.6 g/dL — ABNORMAL LOW (ref 13.0–17.0)
LYMPHS ABS: 1.8 10*3/uL (ref 0.7–4.0)
Lymphocytes Relative: 24.7 % (ref 12.0–46.0)
MCHC: 33.1 g/dL (ref 30.0–36.0)
MCV: 87.2 fl (ref 78.0–100.0)
MONOS PCT: 7 % (ref 3.0–12.0)
Monocytes Absolute: 0.5 10*3/uL (ref 0.1–1.0)
NEUTROS ABS: 4.2 10*3/uL (ref 1.4–7.7)
NEUTROS PCT: 59.4 % (ref 43.0–77.0)
PLATELETS: 202 10*3/uL (ref 150.0–400.0)
RBC: 4.38 Mil/uL (ref 4.22–5.81)
RDW: 13.7 % (ref 11.5–15.5)
WBC: 7.1 10*3/uL (ref 4.0–10.5)

## 2015-03-18 LAB — BASIC METABOLIC PANEL
BUN: 24 mg/dL — ABNORMAL HIGH (ref 6–23)
CO2: 28 meq/L (ref 19–32)
Calcium: 9.7 mg/dL (ref 8.4–10.5)
Chloride: 107 mEq/L (ref 96–112)
Creatinine, Ser: 0.93 mg/dL (ref 0.40–1.50)
GFR: 90.77 mL/min (ref >60.00)
GLUCOSE: 93 mg/dL (ref 70–99)
POTASSIUM: 4.4 meq/L (ref 3.5–5.1)
SODIUM: 141 meq/L (ref 135–145)

## 2015-03-18 MED ORDER — DILTIAZEM HCL ER COATED BEADS 240 MG PO CP24: 240.0000 mg | ORAL_CAPSULE | Freq: Every day | ORAL | Status: DC

## 2015-03-18 NOTE — Patient Instructions (Signed)
Medication Instructions:  Your physician has recommended you make the following change in your medication: 1) DECREASE DILTIAZEM to 240 mg daily  Labwork: TODAY: BMET, CBC  Testing/Procedures: None  Follow-Up: Your physician recommends that you schedule a follow-up appointment in 3-4 weeks with a PA or NP.  Your physician wants you to follow-up in: 6 months with Dr. Mayford Knife. You will receive a reminder letter in the mail two months in advance. If you don't receive a letter, please call our office to schedule the follow-up appointment.   Any Other Special Instructions Will Be Listed Below (If Applicable).

## 2015-03-18 NOTE — Progress Notes (Signed)
Cardiology Office Note   Date:  03/18/2015   ID:  Geoffrey Robinson, DOB 01/07/1961, MRN [id number]  PCP:  Geoffrey Hoit, MD    Chief Complaint  Patient presents with  . OSA      History of Present Illness: Geoffrey Robinson is a 54 y.o. male with a history of severe OSA on CPAP, obesity, HTN, PAF on chronic anticoagulation who presents toady for followup. He is doing well. He denies any chest pain, SOB, DOE, palpitations or syncope. He tolerates his CPAP device well. He tolerates his nasal pillow mask without problems and feels the pressure is adequate. He feels rested when he gets up in the am. He says that he gets about 5 hours of sleep nightly. He does nap during the day.   He walks 40-50 minutes and goes dancing without any problem. He has been complaining that he has been episodic dizziness that are positional going from sitting to standing.      Past Medical History  Diagnosis Date  . PAF (paroxysmal atrial fibrillation)   . HTN (hypertension)   . Warfarin anticoagulation     , Systemic  . Dyslipidemia   . Obesity   . Severe obstructive sleep apnea     , CPAP at 14cm H2O  . Varicose veins     Past Surgical History  Procedure Laterality Date  . Cardiac surgery    . Colonoscopy       Current Outpatient Prescriptions  Medication Sig Dispense Refill  . aspirin 325 MG tablet Take 325 mg by mouth daily.    [name] diltiazem (CARDIZEM CD) 360 MG 24 hr capsule TAKE ONE CAPSULE BY MOUTH ONE TIME DAILY 30 capsule 5  . fenofibrate (TRICOR) 145 MG tablet Take 145 mg by mouth daily.    [name] lisinopril (PRINIVIL,ZESTRIL) 20 MG tablet Take 20 mg by mouth daily.    . metoprolol (LOPRESSOR) 100 MG tablet TAKE ONE TABLET BY MOUTH TWICE DAILY 60 tablet 10  . MULTAQ 400 MG tablet TAKE ONE TABLET BY MOUTH TWICE DAILY 60 tablet 0  . omeprazole (PRILOSEC) 40 MG capsule Take 40 mg by mouth daily.     . rivaroxaban (XARELTO) 20 MG TABS tablet Take 1 tablet (20 mg  total) by mouth daily with supper. 30 tablet 6  . simvastatin (ZOCOR) 10 MG tablet Take 10 mg by mouth daily.     No current facility-administered medications for this visit.    Allergies:   Plavix    Social History:  The patient  reports that he has never smoked. He has never used smokeless tobacco. He reports that he drinks alcohol. He reports that he does not use illicit drugs.   Family History:  The patient's family history includes Heart disease in his father; Hypertension in his father and mother.    ROS:  Please see the history of present illness.   Otherwise, review of systems are positive for none.   All other systems are reviewed and negative.    PHYSICAL EXAM: VS:  BP 100/56 mmHg  Pulse 50  Ht 6\' 3"  (1.905 m)  Wt 283 lb 3.2 oz (128.459 kg)  BMI 35.40 kg/m2  SpO2 99% , BMI Body mass index is 35.4 kg/(m^2). GEN: Well nourished, well developed, in no acute distress HEENT: normal Neck: no JVD, carotid bruits, or masses Cardiac: RRR; no murmurs, rubs, or gallops,no edema  Respiratory:  clear to auscultation bilaterally, normal work of breathing GI: soft, nontender, nondistended, + BS MS: no deformity or atrophy Skin: warm and dry, no rash Neuro:  Strength and sensation are intact Psych: euthymic mood, full affect   EKG:  EKG is not ordered today.    Recent Labs: No results found for requested labs within last 365 days.    Lipid Panel    Component Value Date/Time   CHOL  04/11/2009 0451    105        ATP III CLASSIFICATION:  <200     mg/dL   Desirable  161-096  mg/dL   Borderline High  >=045    mg/dL   High          TRIG 98 04/11/2009 0451   HDL 23* 04/11/2009 0451   CHOLHDL 4.6 04/11/2009 0451   VLDL 20 04/11/2009 0451   LDLCALC  04/11/2009 0451    62        Total Cholesterol/HDL:CHD Risk Coronary Heart Disease Risk Table                     Men   Women  1/2 Average Risk   3.4   3.3  Average Risk       5.0   4.4  2 X Average Risk   9.6   7.1  3  X Average Risk  23.4   11.0        Use the calculated Patient Ratio above and the CHD Risk Table to determine the patient's CHD Risk.        ATP III CLASSIFICATION (LDL):  <100     mg/dL   Optimal  409-811  mg/dL   Near or Above                    Optimal  130-159  mg/dL   Borderline  914-782  mg/dL   High  >956     mg/dL   Very High      Wt Readings from Last 3 Encounters:  03/18/15 283 lb 3.2 oz (128.459 kg)  03/11/15 281 lb (127.461 kg)  02/18/15 278 lb (126.1 kg)    ASSESSMENT AND PLAN:  1. OSA on CPAP therapy - I will get a download from his DME  2. HTN - well controlled but on the low side.  I will decrease Cardiazem to  daily since HR is slow as well.  Followup with extender in 3-4 weeks.  If HR still low and dizziness has not resolved may need to back on on CCB further.   - continue Lisinopril/metoprolol  3. PAF in NSR - continue metoprolol/cardizem/Xarelto/Multaq 4. Chronic anticoagulation - continue Xarelto 5. Obesity - encouraged him to continue on his exercise program 6. Asymptomatic drug induced bradycardia - he is able do dance without any problems    Current medicines are reviewed at length with the patient today.  The patient does not have concerns regarding medicines.  The following changes have been made:  no change  Labs/ tests ordered today: See above Assessment and Plan No orders of the defined types were placed in this encounter.     Disposition:   FU with me in 6 months  Signed, [name], MD  03/18/2015 3:13 PM    [facility] [address] [place], [place], [place]  [address] Phone: [phone]; Fax: [phone]

## 2015-03-21 ENCOUNTER — Telehealth: Payer: Self-pay

## 2015-03-21 NOTE — Telephone Encounter (Signed)
Informed patient of normal lab results and verbal understanding expressed.

## 2015-03-27 ENCOUNTER — Other Ambulatory Visit: Payer: Self-pay | Admitting: Cardiology

## 2015-03-28 ENCOUNTER — Other Ambulatory Visit: Payer: Self-pay | Admitting: Cardiology

## 2015-04-15 ENCOUNTER — Other Ambulatory Visit: Payer: Self-pay | Admitting: Cardiology

## 2015-04-18 ENCOUNTER — Ambulatory Visit: Payer: BC Managed Care – PPO | Admitting: Physician Assistant

## 2015-04-24 ENCOUNTER — Other Ambulatory Visit: Payer: Self-pay | Admitting: Cardiology

## 2015-05-05 NOTE — Progress Notes (Signed)
Cardiology Office Note   Date:  05/06/2015   ID:  Geoffrey Robinson, DOB 08-04-1960, MRN [id number]   Patient Care Team: [name], MD as PCP - General (Family Medicine) [name], MD as Consulting Physician (Cardiology)    Chief Complaint  Patient presents with  . Follow-up    HTN     History of Present Illness: Geoffrey Robinson is a 54 y.o. male with a hx of atrial flutter diagnosed in 2002 status post RFCA, atrial fibrillation status post ablation 2 with Dr. [name] at [facility], tachycardia mediated cardiomyopathy, HTN, HL, obesity, severe OSA on CPAP.  EF was documented as low as 35-40% in 2002 when his AFlutter was dx.  LHC at that time was neg for CAD.  Echo in 3/03 demonstrated EF 60%.  He had a nuclear study in 2010 with EF 44%.    Last seen by Dr. [name].  BP was low and he was complaining of some orthostatic intolerance.  His Diltiazem dose was reduced. He returns for FU. He is feeling better.  He denies any further dizziness. The patient denies chest pain, shortness of breath, syncope, orthopnea, PND.  He has chronic LE edema from venous insufficiency without change.  He has chronic daytime hypersomnolence.     Studies/Reports Reviewed Today:  Myoview 10/10 IMPRESSION:  1. Examination is limited due to body habitus. The patient is reported with 136 kg. There is decreased activity involving the inferior wall and inferior lateral wall. This is felt to likely represent nonuniform soft tissue attenuation. Myocardial ischemia is not excluded.  2. Mild to moderate diffuse global hypokinesis.  3. Left ventricular ejection fraction equals 44%.  Echo 3/03 EF 60%. No regional wall  motion abnormality, moderately dilated left atria, 48 mm  LHC 12/02 LM: widely patent LAD:  widely patent  LCx:  widely patent RCA:  widely patent EF 35-40%.    Past Medical History  Diagnosis Date  . PAF (paroxysmal atrial fibrillation) (HCC)   . HTN (hypertension)     . Warfarin anticoagulation     , Systemic  . Dyslipidemia   . Obesity   . Severe obstructive sleep apnea     , CPAP at 14cm H2O  . Varicose veins   . NICM (nonischemic cardiomyopathy) (HCC)     a. LHC 2002: no CAD, EF 35-40%; b. Echo 2003: EF 60% - tachycardia mediated (AFlutter with RVR)     Past Surgical History  Procedure Laterality Date  . Cardiac surgery    . Colonoscopy       Current Outpatient Prescriptions  Medication Sig Dispense Refill  . aspirin 325 MG tablet Take 325 mg by mouth daily.    [name] diltiazem (CARDIZEM CD) 240 MG 24 hr capsule Take 1 capsule (240 mg total) by mouth daily. 30 capsule 6  . fenofibrate (TRICOR) 145 MG tablet Take 145 mg by mouth daily.    [name] lisinopril (PRINIVIL,ZESTRIL) 20 MG tablet Take 20 mg by mouth daily.    . metoprolol (LOPRESSOR) 100 MG tablet TAKE ONE TABLET BY MOUTH TWICE DAILY 60 tablet 5  . MULTAQ 400 MG tablet TAKE ONE TABLET BY MOUTH TWICE DAILY 60 tablet 3  . omeprazole (PRILOSEC) 40 MG capsule Take 40 mg by mouth daily.     . simvastatin (ZOCOR) 10 MG tablet Take 10 mg by mouth daily.    [name] 20 MG TABS tablet TAKE ONE TABLET BY MOUTH ONE TIME DAILY WITH SUPPER 30 tablet  5   No current facility-administered medications for this visit.    Allergies:   Plavix    Social History:   Social History   Social History  . Marital Status: Married    Spouse Name: N/A  . Number of Children: N/A  . Years of Education: N/A   Social History Main Topics  . Smoking status: Never Smoker   . Smokeless tobacco: Never Used  . Alcohol Use: 0.0 oz/week    0 Standard drinks or equivalent per week     Comment: occassional beer and wine  . Drug Use: No  . Sexual Activity: Not Asked   Other Topics Concern  . None   Social History Narrative     Family History:   Family History  Problem Relation Age of Onset  . Hypertension Mother   . Hypertension Father   . Heart disease Father       ROS:   Please see the history of  present illness.   Review of Systems  All other systems reviewed and are negative.     PHYSICAL EXAM: VS:  BP 112/62 mmHg  Pulse 50  Ht  (1.905 m)  Wt 278 lb 12.8 oz (126.463 kg)  BMI 34.85 kg/m2    Wt Readings from Last 3 Encounters:  05/06/15 278 lb 12.8 oz (126.463 kg)  03/18/15 283 lb 3.2 oz (128.459 kg)  03/11/15 281 lb (127.461 kg)     GEN: Well nourished, well developed, in no acute distress HEENT: normal Neck: no JVD,   no masses Cardiac:  Normal S1/S2, RRR; no murmur ,  no rubs or gallops, 1+ bilateral LE edema   Respiratory:  clear to auscultation bilaterally, no wheezing, rhonchi or rales. GI: soft, nontender, nondistended, + BS MS: no deformity or atrophy Skin: warm and dry  Neuro:  CNs II-XII intact, Strength and sensation are intact Psych: Normal affect   EKG:  EKG is ordered today.  It demonstrates:   Sinus brady, HR 50, RBBB, QTc 475 ms, no change since prior tracing.    Recent Labs: 03/18/2015: BUN 24*; Creatinine, Ser 0.93; Hemoglobin 12.6*; Platelets 202.0; Potassium 4.4; Sodium 141      ASSESSMENT AND PLAN:  1. HTN:  Controlled.  No further dizziness.  With his HR and BP, it would not be unreasonable to reduce his calcium channel blocker further.  However, he is asymptomatic.  I reviewed his chart.  Given his prior hx of atrial arrhythmias and DCM, I would not make further changes at this time. He has been stable on this regimen since 2010.  The patient agrees.   2. PAF:  S/p prior RFCA for AFlutter and PVI ablation at [name] for AFib.  Maintaining NSR.  Continue Xarelto for anticoagulation.  He remains on Multaq, beta-blocker, calcium channel blocker.  Recent Hgb, Creatinine stable.   3. OSA:  Continue CPAP.  4. NICM:  Tachy mediated and dx at time of atrial arrhythmia diagnosis.  Prior LHC with normal cors.  Normal EF documented in 2003.  Continue beta-blocker, ACE inhibitor.    5. Hyperlipidemia:  Followed by PCP.  Continue Simvastatin,  Fenofibrate.    6. Bradycardia:  HR consistently in 50s.  Asymptomatic.  Continue current Rx.     Medication Changes: Current medicines are reviewed at length with the patient today.  Concerns regarding medicines are as outlined above.  The following changes have been made:   Discontinued Medications   No medications on file   Modified Medications  No medications on file   New Prescriptions   No medications on file   Labs/ tests ordered today include:   Orders Placed This Encounter  Procedures  . EKG 12-Lead     Disposition:    FU with Dr. [name] 3/17 as planned.     Signed, Brynda Rim, MHS 05/06/2015 3:41 PM    [facility] [address] [place], [place], [place]  [address] Phone: [phone]; Fax: [phone]

## 2015-05-06 ENCOUNTER — Ambulatory Visit (INDEPENDENT_AMBULATORY_CARE_PROVIDER_SITE_OTHER): Payer: BC Managed Care – PPO | Admitting: Physician Assistant

## 2015-05-06 ENCOUNTER — Encounter: Payer: Self-pay | Admitting: Physician Assistant

## 2015-05-06 VITALS — BP 112/62 | HR 50 | Ht 75.0 in | Wt 278.8 lb

## 2015-05-06 DIAGNOSIS — T50905A Adverse effect of unspecified drugs, medicaments and biological substances, initial encounter: Secondary | ICD-10-CM

## 2015-05-06 DIAGNOSIS — I429 Cardiomyopathy, unspecified: Secondary | ICD-10-CM

## 2015-05-06 DIAGNOSIS — I10 Essential (primary) hypertension: Secondary | ICD-10-CM

## 2015-05-06 DIAGNOSIS — I480 Paroxysmal atrial fibrillation: Secondary | ICD-10-CM | POA: Diagnosis not present

## 2015-05-06 DIAGNOSIS — E785 Hyperlipidemia, unspecified: Secondary | ICD-10-CM

## 2015-05-06 DIAGNOSIS — I428 Other cardiomyopathies: Secondary | ICD-10-CM

## 2015-05-06 DIAGNOSIS — G4733 Obstructive sleep apnea (adult) (pediatric): Secondary | ICD-10-CM

## 2015-05-06 DIAGNOSIS — R001 Bradycardia, unspecified: Secondary | ICD-10-CM

## 2015-05-06 NOTE — Patient Instructions (Signed)
Medication Instructions:   Your physician recommends that you continue on your current medications as directed. Please refer to the Current Medication list given to you today.   If you need a refill on your cardiac medications before your next appointment, please call your pharmacy.  Labwork: NONE ORDER TODAY   Testing/Procedures: NONE ORDER TODAY    Follow-Up:  AS SCHEDULED    Any Other Special Instructions Will Be Listed Below (If Applicable).                                                                                                                                                   

## 2015-05-21 ENCOUNTER — Encounter (HOSPITAL_COMMUNITY): Payer: BC Managed Care – PPO

## 2015-05-21 ENCOUNTER — Ambulatory Visit: Payer: BC Managed Care – PPO | Admitting: Vascular Surgery

## 2015-07-23 ENCOUNTER — Telehealth: Payer: Self-pay

## 2015-07-23 NOTE — Telephone Encounter (Signed)
Samples of Multaq 400mg  4 packets provided to patient.

## 2015-07-25 ENCOUNTER — Telehealth: Payer: Self-pay | Admitting: Cardiology

## 2015-07-25 NOTE — Telephone Encounter (Signed)
Returning your call from yesterday. °

## 2015-08-14 ENCOUNTER — Telehealth: Payer: Self-pay | Admitting: Cardiology

## 2015-08-14 NOTE — Telephone Encounter (Signed)
Returning your call. °

## 2015-08-15 NOTE — Telephone Encounter (Signed)
Patient returned call to [facility] from earlier this week.  He requests when she gets back to call him at his work line since he is never home during the day.   To Bonita Quin, RN.

## 2015-08-15 NOTE — Telephone Encounter (Signed)
Follow up     Called back to change his phone number for the nurse to call

## 2015-08-18 ENCOUNTER — Other Ambulatory Visit: Payer: Self-pay | Admitting: Cardiology

## 2015-08-21 ENCOUNTER — Telehealth: Payer: Self-pay

## 2015-08-21 ENCOUNTER — Telehealth: Payer: Self-pay | Admitting: *Deleted

## 2015-08-21 NOTE — Telephone Encounter (Signed)
Have let patient know we have samples for him at front desk. Also have requested Prior auth through [facility] for his Multaq.

## 2015-08-21 NOTE — Telephone Encounter (Signed)
Called patient to let him know that I was placing multaq samples at the front desk. He did not answer and voicemail was unidentified.

## 2015-08-21 NOTE — Telephone Encounter (Signed)
Prior auth for Multaq 400mg  sent to [facility]

## 2015-08-23 ENCOUNTER — Telehealth: Payer: Self-pay

## 2015-08-23 NOTE — Telephone Encounter (Signed)
[facility] states "No PA needed " for Multaq. Spoke with a pharmacist who told me his co/pay would be > $500.00. I have requested a Tier exception for this.

## 2015-08-28 ENCOUNTER — Telehealth: Payer: Self-pay

## 2015-08-28 ENCOUNTER — Telehealth: Payer: Self-pay | Admitting: Cardiology

## 2015-08-28 NOTE — Telephone Encounter (Signed)
Spoke with patient and informed him Multaq is not on his formulary. A PA was not needed, and a Tier Reduction did no good. Alternatives sent to Dr. Mayford Knife for consideration. I also told patient we could pursue Patient Assistance.

## 2015-08-28 NOTE — Telephone Encounter (Signed)
If he wants to stay on Multaq will need to look into patient assistance

## 2015-08-28 NOTE — Telephone Encounter (Signed)
He has been on Amiodarone in the past, but not sure if it was effective. He would prefer to stay on Multaq

## 2015-08-28 NOTE — Telephone Encounter (Signed)
He wants to know if you found out anything about his MultaQ?

## 2015-08-28 NOTE — Telephone Encounter (Signed)
Multaq is not on patient's formulary. I have tried a PA and a Tier reduction, but [facility] won't budge. It would cost him > $500.00/month. I could try for Patient Assistance, or alternatives are: Acebutolol, Amiodarone, Disopyramide, Dofetilide, Flecainide, Propafenone or Sotalol.

## 2015-08-28 NOTE — Telephone Encounter (Signed)
Please find out if he has been on any other antiarrhythmics in the past

## 2015-08-30 NOTE — Telephone Encounter (Signed)
PA forms provided to patient.

## 2015-09-09 DIAGNOSIS — K219 Gastro-esophageal reflux disease without esophagitis: Secondary | ICD-10-CM | POA: Insufficient documentation

## 2015-09-09 DIAGNOSIS — Z7901 Long term (current) use of anticoagulants: Secondary | ICD-10-CM | POA: Insufficient documentation

## 2015-09-30 ENCOUNTER — Telehealth: Payer: Self-pay

## 2015-09-30 NOTE — Telephone Encounter (Signed)
Samples of Multaq 400mg  provided to patient. Discount card also given, since [employer] cover it at all.

## 2015-10-21 ENCOUNTER — Ambulatory Visit (INDEPENDENT_AMBULATORY_CARE_PROVIDER_SITE_OTHER): Payer: BC Managed Care – PPO | Admitting: Cardiology

## 2015-10-21 ENCOUNTER — Encounter: Payer: Self-pay | Admitting: Cardiology

## 2015-10-21 VITALS — BP 124/78 | HR 52 | Ht 75.0 in | Wt 287.1 lb

## 2015-10-21 DIAGNOSIS — I10 Essential (primary) hypertension: Secondary | ICD-10-CM | POA: Diagnosis not present

## 2015-10-21 DIAGNOSIS — G4733 Obstructive sleep apnea (adult) (pediatric): Secondary | ICD-10-CM | POA: Diagnosis not present

## 2015-10-21 DIAGNOSIS — E669 Obesity, unspecified: Secondary | ICD-10-CM

## 2015-10-21 DIAGNOSIS — R001 Bradycardia, unspecified: Secondary | ICD-10-CM

## 2015-10-21 DIAGNOSIS — I480 Paroxysmal atrial fibrillation: Secondary | ICD-10-CM | POA: Diagnosis not present

## 2015-10-21 HISTORY — DX: Bradycardia, unspecified: R00.1

## 2015-10-21 NOTE — Progress Notes (Signed)
Cardiology Office Note    Date:  10/21/2015   ID:  Geoffrey Robinson, DOB Jun 13, 1961, MRN [id number]  PCP:  Pamelia Hoit, MD  Cardiologist:  [name], MD   Chief Complaint  Patient presents with  . Atrial Fibrillation  . Hypertension    History of Present Illness:  Geoffrey Robinson is a 55 y.o. male with a history of severe OSA on CPAP, obesity, HTN, PAF on chronic anticoagulation who presents toady for followup. He is doing well. He denies any chest pain, SOB, DOE, palpitations or syncope. He tolerates his CPAP device well. He tolerates his nasal pillow mask without problems and feels the pressure is adequate.  He has some problems with it during allergy season with his sinuses.   He feels rested when he gets up in the am. He says that he gets about 5 hours of sleep nightly. He occasionally naps during the day. He walks 30-40 minutes daily and goes dancing without any problem.    Past Medical History  Diagnosis Date  . PAF (paroxysmal atrial fibrillation) (HCC)   . HTN (hypertension)   . Warfarin anticoagulation     , Systemic  . Dyslipidemia   . Obesity   . Severe obstructive sleep apnea     , CPAP at 14cm H2O  . Varicose veins   . NICM (nonischemic cardiomyopathy) (HCC)     a. LHC 2002: no CAD, EF 35-40%; b. Echo 2003: EF 60% - tachycardia mediated (AFlutter with RVR)   . Bradycardia 10/21/2015    Past Surgical History  Procedure Laterality Date  . Cardiac surgery    . Colonoscopy      Current Medications: Outpatient Prescriptions Prior to Visit  Medication Sig Dispense Refill  . aspirin 325 MG tablet Take 325 mg by mouth daily.    [name] diltiazem (CARDIZEM CD) 240 MG 24 hr capsule Take 1 capsule (240 mg total) by mouth daily. 30 capsule 6  . fenofibrate (TRICOR) 145 MG tablet Take 145 mg by mouth daily.    [name] lisinopril (PRINIVIL,ZESTRIL) 20 MG tablet Take 20 mg by mouth daily.    . metoprolol (LOPRESSOR) 100 MG tablet TAKE ONE TABLET BY MOUTH TWICE  DAILY 60 tablet 5  . MULTAQ 400 MG tablet TAKE ONE TABLET BY MOUTH TWICE DAILY 60 tablet 7  . omeprazole (PRILOSEC) 40 MG capsule Take 40 mg by mouth daily.     . simvastatin (ZOCOR) 10 MG tablet Take 10 mg by mouth daily.    [name] 20 MG TABS tablet TAKE ONE TABLET BY MOUTH ONE TIME DAILY WITH SUPPER 30 tablet 5   No facility-administered medications prior to visit.     Allergies:   Plavix   Social History   Social History  . Marital Status: Married    Spouse Name: N/A  . Number of Children: N/A  . Years of Education: N/A   Social History Main Topics  . Smoking status: Never Smoker   . Smokeless tobacco: Never Used  . Alcohol Use: 0.0 oz/week    0 Standard drinks or equivalent per week     Comment: occassional beer and wine  . Drug Use: No  . Sexual Activity: Not Asked   Other Topics Concern  . None   Social History Narrative     Family History:  The patient's family history includes Heart disease in his father; Hypertension in his father and mother.   ROS:   Please see the history of present illness.  ROS All other systems reviewed and are negative.   PHYSICAL EXAM:   VS:  BP 124/78 mmHg  Pulse 52  Ht  (1.905 m)  Wt 287 lb 1.9 oz (130.237 kg)  BMI 35.89 kg/m2   GEN: Well nourished, well developed, in no acute distress HEENT: normal Neck: no JVD, carotid bruits, or masses Cardiac: RRR; no murmurs, rubs, or gallops,no edema.  Intact distal pulses bilaterally.  Respiratory:  clear to auscultation bilaterally, normal work of breathing GI: soft, nontender, nondistended, + BS MS: no deformity or atrophy Skin: warm and dry, no rash Neuro:  Alert and Oriented x 3, Strength and sensation are intact Psych: euthymic mood, full affect  Wt Readings from Last 3 Encounters:  10/21/15 287 lb 1.9 oz (130.237 kg)  05/06/15 278 lb 12.8 oz (126.463 kg)  03/18/15 283 lb 3.2 oz (128.459 kg)      Studies/Labs Reviewed:   EKG:  EKG is not ordered  today.  Recent Labs: 03/18/2015: BUN 24*; Creatinine, Ser 0.93; Hemoglobin 12.6*; Platelets 202.0; Potassium 4.4; Sodium 141   Lipid Panel    Component Value Date/Time   CHOL  04/11/2009 0451    105        ATP III CLASSIFICATION:  <200     mg/dL   Desirable  696-295  mg/dL   Borderline High  >=284    mg/dL   High          TRIG 98 04/11/2009 0451   HDL 23* 04/11/2009 0451   CHOLHDL 4.6 04/11/2009 0451   VLDL 20 04/11/2009 0451   LDLCALC  04/11/2009 0451    62        Total Cholesterol/HDL:CHD Risk Coronary Heart Disease Risk Table                     Men   Women  1/2 Average Risk   3.4   3.3  Average Risk       5.0   4.4  2 X Average Risk   9.6   7.1  3 X Average Risk  23.4   11.0        Use the calculated Patient Ratio above and the CHD Risk Table to determine the patient's CHD Risk.        ATP III CLASSIFICATION (LDL):  <100     mg/dL   Optimal  132-440  mg/dL   Near or Above                    Optimal  130-159  mg/dL   Borderline  102-725  mg/dL   High  >366     mg/dL   Very High    Additional studies/ records that were reviewed today include:  none    ASSESSMENT:    1. PAF (paroxysmal atrial fibrillation) (HCC)   2. Essential hypertension   3. Severe obstructive sleep apnea   4. Obesity   5. Bradycardia      PLAN:  In order of problems listed above:  1. PAF - maintaining NSR on Multaq. Continue Multaq/Xarelto/BB/CCB.  Stop ASA. 2. HTN - BP controlled on BB/CCB/ACE I 3. OSA on CPAP and tolerating well - I will get a d/l from his DME.  He will continue on current settings.   4. Obesity - encourage him to continue on his daily exercise routine. 5. Drug induced bradycardia - asymptomatic    Medication Adjustments/Labs and Tests Ordered: Current medicines are reviewed at length  with the patient today.  Concerns regarding medicines are outlined above.  Medication changes, Labs and Tests ordered today are listed in the Patient Instructions  below.   Harlon Flor, MD  10/21/2015 8:38 AM    [facility] [address] [place], [place], [place]  [address] Phone: [phone]; Fax: [phone]

## 2015-10-21 NOTE — Patient Instructions (Signed)
Medication Instructions:  Your physician has recommended you make the following change in your medication: 1) STOP ASPIRIN  Labwork: None  Testing/Procedures: None  Follow-Up: Your physician wants you to follow-up in: 6 months with Dr.Turner. You will receive a reminder letter in the mail two months in advance. If you don't receive a letter, please call our office to schedule the follow-up appointment.   Any Other Special Instructions Will Be Listed Below (If Applicable).     If you need a refill on your cardiac medications before your next appointment, please call your pharmacy.   

## 2015-11-14 ENCOUNTER — Encounter: Payer: Self-pay | Admitting: Cardiology

## 2015-11-19 ENCOUNTER — Other Ambulatory Visit: Payer: Self-pay | Admitting: Cardiology

## 2016-01-02 ENCOUNTER — Other Ambulatory Visit: Payer: Self-pay

## 2016-01-02 MED ORDER — RIVAROXABAN 20 MG PO TABS: ORAL_TABLET | ORAL | Status: DC

## 2016-03-10 NOTE — Progress Notes (Signed)
Cardiology Office Note    Date:  03/11/2016   ID:  Geoffrey BiWilliam D Schaberg, DOB 04/10/1961, MRN [id number]  PCP:  Pamelia HoitWILSON,FRED HENRY, MD  Cardiologist:  Armanda Magicraci Tatianna Ibbotson, MD   Chief Complaint  Patient presents with  . Atrial Fibrillation  . Sleep Apnea  . Hypertension    History of Present Illness:  Geoffrey Robinson is a 55 y.o. male  with a history of severe OSA on CPAP, obesity, HTN, PAF on chronic anticoagulation who presents toady for followup. He is doing well. He denies any chest pain, SOB, DOE, palpitations or syncope. He tolerates his CPAP device well. He tolerates his nasal pillow mask without problems and feels the pressure is adequate.   He feels rested when he gets up in the am. He occasionally naps during the day. He walks 30-40 minutes daily and goes dancing without any problem    Past Medical History:  Diagnosis Date  . Bradycardia 10/21/2015  . Dyslipidemia   . HTN (hypertension)   . NICM (nonischemic cardiomyopathy) (HCC)    a. LHC 2002: no CAD, EF 35-40%; b. Echo 2003: EF 60% - tachycardia mediated (AFlutter with RVR)   . Obesity   . PAF (paroxysmal atrial fibrillation) (HCC)   . Severe obstructive sleep apnea    , CPAP at 14cm H2O  . Varicose veins   . Warfarin anticoagulation    , Systemic    Past Surgical History:  Procedure Laterality Date  . CARDIAC SURGERY    . COLONOSCOPY      Current Medications: Outpatient Medications Prior to Visit  Medication Sig Dispense Refill  . diltiazem (CARDIZEM CD) 240 MG 24 hr capsule TAKE ONE CAPSULE BY MOUTH ONE TIME DAILY 90 capsule 3  . fenofibrate (TRICOR) 145 MG tablet Take 145 mg by mouth daily.    [name]. lisinopril (PRINIVIL,ZESTRIL) 20 MG tablet Take 20 mg by mouth daily.    . metoprolol (LOPRESSOR) 100 MG tablet TAKE ONE TABLET BY MOUTH TWICE DAILY 60 tablet 5  . MULTAQ 400 MG tablet TAKE ONE TABLET BY MOUTH TWICE DAILY 60 tablet 7  . omeprazole (PRILOSEC) 40 MG capsule Take 40 mg by mouth daily.     . rivaroxaban  (XARELTO) 20 MG TABS tablet TAKE ONE TABLET BY MOUTH ONE TIME DAILY WITH SUPPER 30 tablet 5  . simvastatin (ZOCOR) 10 MG tablet Take 10 mg by mouth daily.     No facility-administered medications prior to visit.      Allergies:   Plavix [clopidogrel bisulfate]   Social History   Social History  . Marital status: Married    Spouse name: N/A  . Number of children: N/A  . Years of education: N/A   Social History Main Topics  . Smoking status: Never Smoker  . Smokeless tobacco: Never Used  . Alcohol use 0.0 oz/week     Comment: occassional beer and wine  . Drug use: No  . Sexual activity: Not Asked   Other Topics Concern  . None   Social History Narrative  . None     Family History:  The patient's family history includes Heart disease in his father; Hypertension in his father and mother.   ROS:   Please see the history of present illness.    ROS All other systems reviewed and are negative.  No flowsheet data found.     PHYSICAL EXAM:   VS:  BP 132/78   Pulse (!) 51   Ht 6\' 3"  (1.905 m)  Wt 283 lb 12.8 oz (128.7 kg)   SpO2 96%   BMI 35.47 kg/m    GEN: Well nourished, well developed, in no acute distress  HEENT: normal  Neck: no JVD, carotid bruits, or masses Cardiac: RRR; no murmurs, rubs, or gallops,no edema.  Intact distal pulses bilaterally.  Respiratory:  clear to auscultation bilaterally, normal work of breathing GI: soft, nontender, nondistended, + BS MS: no deformity or atrophy  Skin: warm and dry, no rash Neuro:  Alert and Oriented x 3, Strength and sensation are intact Psych: euthymic mood, full affect  Wt Readings from Last 3 Encounters:  03/11/16 283 lb 12.8 oz (128.7 kg)  10/21/15 287 lb 1.9 oz (130.2 kg)  05/06/15 278 lb 12.8 oz (126.5 kg)      Studies/Labs Reviewed:   EKG:  EKG is not ordered today.   Recent Labs: 03/18/2015: BUN 24; Creatinine, Ser 0.93; Hemoglobin 12.6; Platelets 202.0; Potassium 4.4; Sodium 141   Lipid Panel      Component Value Date/Time   CHOL  04/11/2009 0451    105        ATP III CLASSIFICATION:  <200     mg/dL   Desirable  524-818  mg/dL   Borderline High  >=590    mg/dL   High          TRIG 98 04/11/2009 0451   HDL 23 (L) 04/11/2009 0451   CHOLHDL 4.6 04/11/2009 0451   VLDL 20 04/11/2009 0451   LDLCALC  04/11/2009 0451    62        Total Cholesterol/HDL:CHD Risk Coronary Heart Disease Risk Table                     Men   Women  1/2 Average Risk   3.4   3.3  Average Risk       5.0   4.4  2 X Average Risk   9.6   7.1  3 X Average Risk  23.4   11.0        Use the calculated Patient Ratio above and the CHD Risk Table to determine the patient's CHD Risk.        ATP III CLASSIFICATION (LDL):  <100     mg/dL   Optimal  931-121  mg/dL   Near or Above                    Optimal  130-159  mg/dL   Borderline  624-469  mg/dL   High  >507     mg/dL   Very High    Additional studies/ records that were reviewed today include:  none    ASSESSMENT:    1. PAF (paroxysmal atrial fibrillation) (HCC)   2. Essential hypertension   3. Severe obstructive sleep apnea   4. Obesity      PLAN:  In order of problems listed above:  1.  PAF maintaining NSR.  Continue Cardizem/Multaq/BB and Xarelto.  Check BMET/LFTs and CBC. 2.  HTN - BP controlled on current meds.  Continue ACE I and BB OSA - the patient is tolerating PAP therapy well without any problems. The patient has been using and benefiting from CPAP use and will continue to benefit from therapy.   I will order him a new device and get a download from the DME.   4.  Obesity - He will continue in his exercise program.    Medication Adjustments/Labs and Tests Ordered: Current medicines  are reviewed at length with the patient today.  Concerns regarding medicines are outlined above.  Medication changes, Labs and Tests ordered today are listed in the Patient Instructions below.  There are no Patient Instructions on file for this  visit.   Signed, [name], MD  03/11/2016 8:41 AM    [facility] [address] [place], [place], [place]  [address] Phone: [phone]; Fax: [phone]

## 2016-03-11 ENCOUNTER — Ambulatory Visit (INDEPENDENT_AMBULATORY_CARE_PROVIDER_SITE_OTHER): Payer: BC Managed Care – PPO | Admitting: Cardiology

## 2016-03-11 ENCOUNTER — Encounter: Payer: Self-pay | Admitting: Cardiology

## 2016-03-11 ENCOUNTER — Encounter (INDEPENDENT_AMBULATORY_CARE_PROVIDER_SITE_OTHER): Payer: Self-pay

## 2016-03-11 VITALS — BP 132/78 | HR 51 | Ht 75.0 in | Wt 283.8 lb

## 2016-03-11 DIAGNOSIS — I10 Essential (primary) hypertension: Secondary | ICD-10-CM | POA: Diagnosis not present

## 2016-03-11 DIAGNOSIS — E669 Obesity, unspecified: Secondary | ICD-10-CM | POA: Diagnosis not present

## 2016-03-11 DIAGNOSIS — G4733 Obstructive sleep apnea (adult) (pediatric): Secondary | ICD-10-CM | POA: Diagnosis not present

## 2016-03-11 DIAGNOSIS — I480 Paroxysmal atrial fibrillation: Secondary | ICD-10-CM

## 2016-03-11 LAB — COMPREHENSIVE METABOLIC PANEL
ALT: 22 U/L (ref 9–46)
AST: 23 U/L (ref 10–35)
Albumin: 4.2 g/dL (ref 3.6–5.1)
Alkaline Phosphatase: 58 U/L (ref 40–115)
BUN: 20 mg/dL (ref 7–25)
CALCIUM: 9.4 mg/dL (ref 8.6–10.3)
CO2: 26 mmol/L (ref 20–31)
Chloride: 106 mmol/L (ref 98–110)
Creat: 0.99 mg/dL (ref 0.70–1.33)
GLUCOSE: 98 mg/dL (ref 65–99)
Potassium: 4.4 mmol/L (ref 3.5–5.3)
SODIUM: 141 mmol/L (ref 135–146)
Total Bilirubin: 0.9 mg/dL (ref 0.2–1.2)
Total Protein: 7 g/dL (ref 6.1–8.1)

## 2016-03-11 LAB — CBC WITH DIFFERENTIAL/PLATELET
BASOS PCT: 1 %
Basophils Absolute: 50 cells/uL (ref 0–200)
EOS PCT: 5 %
Eosinophils Absolute: 250 cells/uL (ref 15–500)
HEMATOCRIT: 40.9 % (ref 38.5–50.0)
HEMOGLOBIN: 14 g/dL (ref 13.2–17.1)
LYMPHS ABS: 1700 {cells}/uL (ref 850–3900)
Lymphocytes Relative: 34 %
MCH: 29 pg (ref 27.0–33.0)
MCHC: 34.2 g/dL (ref 32.0–36.0)
MCV: 84.9 fL (ref 80.0–100.0)
MONO ABS: 600 {cells}/uL (ref 200–950)
MPV: 11.7 fL (ref 7.5–12.5)
Monocytes Relative: 12 %
Neutro Abs: 2400 cells/uL (ref 1500–7800)
Neutrophils Relative %: 48 %
Platelets: 218 10*3/uL (ref 140–400)
RBC: 4.82 MIL/uL (ref 4.20–5.80)
RDW: 13.1 % (ref 11.0–15.0)
WBC: 5 10*3/uL (ref 3.8–10.8)

## 2016-03-11 NOTE — Addendum Note (Signed)
Addended by: Lenis Dickinson on: 03/11/2016 09:05 AM   Modules accepted: Orders

## 2016-03-11 NOTE — Patient Instructions (Signed)
Your physician wants you to follow-up in: 6 MONTHS WITH  DR   Mayford Knife  You will receive a reminder letter in the mail two months in advance. If you don't receive a letter, please call our office to schedule the follow-up appointment.  Your physician recommends that you continue on your current medications as directed. Please refer to the Current Medication list given to you today.  Your physician recommends that you return for lab work in: TODAY  CMET AND  CBC

## 2016-03-13 ENCOUNTER — Telehealth: Payer: Self-pay | Admitting: Cardiology

## 2016-03-13 NOTE — Telephone Encounter (Signed)
Pt returned call about lab results.  Reviewed labs with pt.  Pt verbalized understanding.

## 2016-03-13 NOTE — Telephone Encounter (Signed)
Follow up   Pt is returning call for rn 03-12-16

## 2016-03-27 ENCOUNTER — Telehealth: Payer: Self-pay | Admitting: *Deleted

## 2016-03-27 NOTE — Telephone Encounter (Signed)
Patient needs an appointment with Dr. Mayford Knife the first or second week of December.   Left message for patient to call to schedule this appointment.

## 2016-04-20 ENCOUNTER — Encounter: Payer: Self-pay | Admitting: *Deleted

## 2016-05-06 ENCOUNTER — Other Ambulatory Visit: Payer: Self-pay | Admitting: Cardiology

## 2016-05-21 ENCOUNTER — Ambulatory Visit: Payer: BC Managed Care – PPO | Admitting: Cardiology

## 2016-06-07 ENCOUNTER — Encounter: Payer: Self-pay | Admitting: Cardiology

## 2016-06-09 ENCOUNTER — Encounter (INDEPENDENT_AMBULATORY_CARE_PROVIDER_SITE_OTHER): Payer: Self-pay

## 2016-06-09 ENCOUNTER — Ambulatory Visit (INDEPENDENT_AMBULATORY_CARE_PROVIDER_SITE_OTHER): Payer: BC Managed Care – PPO | Admitting: Cardiology

## 2016-06-09 ENCOUNTER — Encounter: Payer: Self-pay | Admitting: Cardiology

## 2016-06-09 VITALS — BP 148/80 | HR 54 | Ht 75.0 in | Wt 294.4 lb

## 2016-06-09 DIAGNOSIS — E6609 Other obesity due to excess calories: Secondary | ICD-10-CM

## 2016-06-09 DIAGNOSIS — I4819 Other persistent atrial fibrillation: Secondary | ICD-10-CM

## 2016-06-09 DIAGNOSIS — G4733 Obstructive sleep apnea (adult) (pediatric): Secondary | ICD-10-CM

## 2016-06-09 DIAGNOSIS — I481 Persistent atrial fibrillation: Secondary | ICD-10-CM

## 2016-06-09 DIAGNOSIS — I10 Essential (primary) hypertension: Secondary | ICD-10-CM

## 2016-06-09 NOTE — Patient Instructions (Signed)

## 2016-06-09 NOTE — Progress Notes (Signed)
Cardiology Office Note    Date:  06/09/2016   ID:  Geoffrey Robinson, DOB 02-20-1961, MRN [id number]  PCP:  Pamelia Hoit, MD  Cardiologist:  [name], MD   Chief Complaint  Patient presents with  . Sleep Apnea  . Hypertension  . Atrial Fibrillation    History of Present Illness:  Geoffrey Robinson is a 55 y.o. male with a history of severe OSA on CPAP, obesity, HTN, PAF on chronic anticoagulation who presents toady for followup. He is doing well. He denies any chest pain, SOB, DOE, palpitations or syncope. He tolerates his CPAP device well. He tolerates his nasal pillow mask without problems and feels the pressure is adequate. He feels rested when he gets up in the am. He occasionally naps during the day. He has not been walking as much due to back problems.    Past Medical History:  Diagnosis Date  . Bradycardia 10/21/2015  . Dyslipidemia   . HTN (hypertension)   . NICM (nonischemic cardiomyopathy) (HCC)    a. LHC 2002: no CAD, EF 35-40%; b. Echo 2003: EF 60% - tachycardia mediated (AFlutter with RVR)   . Obesity   . Persistent atrial fibrillation (HCC)   . Severe obstructive sleep apnea    , CPAP at 14cm H2O  . Varicose veins   . Warfarin anticoagulation    , Systemic    Past Surgical History:  Procedure Laterality Date  . CARDIAC SURGERY    . COLONOSCOPY      Current Medications: Outpatient Medications Prior to Visit  Medication Sig Dispense Refill  . diltiazem (CARDIZEM CD) 240 MG 24 hr capsule TAKE ONE CAPSULE BY MOUTH ONE TIME DAILY 90 capsule 3  . fenofibrate (TRICOR) 145 MG tablet Take 145 mg by mouth daily.    [name] lisinopril (PRINIVIL,ZESTRIL) 20 MG tablet Take 20 mg by mouth daily.    . metoprolol (LOPRESSOR) 100 MG tablet TAKE ONE TABLET BY MOUTH TWICE DAILY 60 tablet 5  . MULTAQ 400 MG tablet TAKE ONE TABLET BY MOUTH TWICE DAILY 60 tablet 9  . omeprazole (PRILOSEC) 40 MG capsule Take 40 mg by mouth daily.     . rivaroxaban (XARELTO) 20 MG  TABS tablet TAKE ONE TABLET BY MOUTH ONE TIME DAILY WITH SUPPER 30 tablet 5  . simvastatin (ZOCOR) 10 MG tablet Take 10 mg by mouth daily.     No facility-administered medications prior to visit.      Allergies:   Plavix [clopidogrel bisulfate]   Social History   Social History  . Marital status: Married    Spouse name: N/A  . Number of children: N/A  . Years of education: N/A   Social History Main Topics  . Smoking status: Never Smoker  . Smokeless tobacco: Never Used  . Alcohol use 0.0 oz/week     Comment: occassional beer and wine  . Drug use: No  . Sexual activity: Not Asked   Other Topics Concern  . None   Social History Narrative  . None     Family History:  The patient's family history includes Heart disease in his father; Hypertension in his father and mother.   ROS:   Please see the history of present illness.    ROS All other systems reviewed and are negative.  No flowsheet data found.     PHYSICAL EXAM:   VS:  BP (!) 148/80   Pulse (!) 54   Ht 6\' 3"  (1.905 m)   Wt 294  lb 6.4 oz (133.5 kg)   BMI 36.80 kg/m    GEN: Well nourished, well developed, in no acute distress  HEENT: normal  Neck: no JVD, carotid bruits, or masses Cardiac: RRR; no murmurs, rubs, or gallops,no edema.  Intact distal pulses bilaterally.  Respiratory:  clear to auscultation bilaterally, normal work of breathing GI: soft, nontender, nondistended, + BS MS: no deformity or atrophy  Skin: warm and dry, no rash Neuro:  Alert and Oriented x 3, Strength and sensation are intact Psych: euthymic mood, full affect  Wt Readings from Last 3 Encounters:  06/09/16 294 lb 6.4 oz (133.5 kg)  03/11/16 283 lb 12.8 oz (128.7 kg)  10/21/15 287 lb 1.9 oz (130.2 kg)      Studies/Labs Reviewed:   EKG:  EKG is not ordered today.    Recent Labs: 03/11/2016: ALT 22; BUN 20; Creat 0.99; Hemoglobin 14.0; Platelets 218; Potassium 4.4; Sodium 141   Lipid Panel    Component Value Date/Time    CHOL  04/11/2009 0451    105        ATP III CLASSIFICATION:  <200     mg/dL   Desirable  161-096200-239  mg/dL   Borderline High  >=045>=240    mg/dL   High          TRIG 98 04/11/2009 0451   HDL 23 (L) 04/11/2009 0451   CHOLHDL 4.6 04/11/2009 0451   VLDL 20 04/11/2009 0451   LDLCALC  04/11/2009 0451    62        Total Cholesterol/HDL:CHD Risk Coronary Heart Disease Risk Table                     Men   Women  1/2 Average Risk   3.4   3.3  Average Risk       5.0   4.4  2 X Average Risk   9.6   7.1  3 X Average Risk  23.4   11.0        Use the calculated Patient Ratio above and the CHD Risk Table to determine the patient's CHD Risk.        ATP III CLASSIFICATION (LDL):  <100     mg/dL   Optimal  409-811100-129  mg/dL   Near or Above                    Optimal  130-159  mg/dL   Borderline  914-782160-189  mg/dL   High  >956>190     mg/dL   Very High    Additional studies/ records that were reviewed today include:  CPAP download    ASSESSMENT:    1. Severe obstructive sleep apnea   2. Persistent atrial fibrillation (HCC)   3. Essential hypertension   4. Class 1 obesity due to excess calories with serious comorbidity in adult, unspecified BMI      PLAN:  In order of problems listed above:  OSA - the patient is tolerating PAP therapy well without any problems. The PAP download was reviewed today and showed an AHI of 3.6/hr on 15 cm H2O with 70% compliance in using more than 4 hours nightly.  The patient has been using and benefiting from CPAP use and will continue to benefit from therapy.  Persistent atrial fibrillation - maintaining NSR.  Continue Xarelto/multaq/BB and CCB.  HTN - BP borderline controlled on current meds.  Continue CCB/BB and ACE I. Obesity - I have encouraged him to  get into a routine exercise program and cut back on carbs and portions.      Medication Adjustments/Labs and Tests Ordered: Current medicines are reviewed at length with the patient today.  Concerns regarding  medicines are outlined above.  Medication changes, Labs and Tests ordered today are listed in the Patient Instructions below.  There are no Patient Instructions on file for this visit.   Signed, [name], MD  06/09/2016 8:37 AM    [facility] [address] [place], [place], [place]  [address] Phone: [phone]; Fax: [phone]

## 2016-07-02 ENCOUNTER — Ambulatory Visit: Payer: BC Managed Care – PPO | Admitting: Cardiology

## 2016-07-06 ENCOUNTER — Other Ambulatory Visit: Payer: Self-pay | Admitting: Cardiology

## 2016-07-30 ENCOUNTER — Telehealth: Payer: Self-pay | Admitting: Cardiology

## 2016-07-30 NOTE — Telephone Encounter (Signed)
error 

## 2016-07-30 NOTE — Telephone Encounter (Signed)
Pt on Xarelto not Coumadin for anticoagulation. Does not need to hold Xarelto if just a single tooth is being pulled. If 2 or more teeth are being pulled, would be ok to hold Xarelto for 24 hours prior. Clearance faxed to dentist office.

## 2016-07-30 NOTE — Telephone Encounter (Signed)
New message      1. What dental office are you calling from?  [facility]  What is your office phone and fax number?  Fax [phone] 2. What type of procedure is the patient having performed? Tooth extraction  3. What date is procedure scheduled?  Pending clearance  What is your question (ex. Antibiotics prior to procedure, holding medication-we need to know how long dentist wants pt to hold med)?  Hold coumadin

## 2016-11-17 ENCOUNTER — Other Ambulatory Visit: Payer: Self-pay | Admitting: Cardiology

## 2016-11-20 ENCOUNTER — Other Ambulatory Visit: Payer: Self-pay | Admitting: Cardiology

## 2016-12-10 ENCOUNTER — Ambulatory Visit (INDEPENDENT_AMBULATORY_CARE_PROVIDER_SITE_OTHER): Payer: BC Managed Care – PPO | Admitting: Cardiology

## 2016-12-10 ENCOUNTER — Encounter (INDEPENDENT_AMBULATORY_CARE_PROVIDER_SITE_OTHER): Payer: Self-pay

## 2016-12-10 ENCOUNTER — Encounter: Payer: Self-pay | Admitting: Cardiology

## 2016-12-10 VITALS — BP 126/78 | HR 59 | Ht 75.0 in | Wt 295.4 lb

## 2016-12-10 DIAGNOSIS — I481 Persistent atrial fibrillation: Secondary | ICD-10-CM | POA: Diagnosis not present

## 2016-12-10 DIAGNOSIS — I10 Essential (primary) hypertension: Secondary | ICD-10-CM

## 2016-12-10 DIAGNOSIS — I4819 Other persistent atrial fibrillation: Secondary | ICD-10-CM

## 2016-12-10 DIAGNOSIS — G4733 Obstructive sleep apnea (adult) (pediatric): Secondary | ICD-10-CM

## 2016-12-10 NOTE — Progress Notes (Signed)
Cardiology Office Note    Date:  12/10/2016   ID:  Geoffrey Robinson, DOB [birth year], MRN [id number]  PCP:  [name], MD  Cardiologist:  [name], MD   Chief Complaint  Patient presents with  . Follow-up  . Sleep Apnea  . Hypertension  . Atrial Fibrillation    History of Present Illness:  Geoffrey Robinson is a 56 y.o. male with a history of severe OSA on CPAP, obesity, HTN and persistent atrial fibrillation on chronic anticoagulation.  He is here today for followup and is doing well.   He denies any chest pain or pressure, SOB, DOE, PND, orthopnea, dizziness or syncope. Rarely he will notice a fluttering in his chest.  He tolerates his CPAP device well. He tolerates his nasal pillow mask without problems and feels the pressure is adequate. He continues to feel rested when he gets up in the am. He has not taken any naps recently.  He has gained 12lbs since the fall but has started back walking and continues to dance.    Past Medical History:  Diagnosis Date  . Bradycardia 10/21/2015  . Dyslipidemia   . HTN (hypertension)   . NICM (nonischemic cardiomyopathy) (HCC)    a. LHC 2002: no CAD, EF 35-40%; b. Echo 2003: EF 60% - tachycardia mediated (AFlutter with RVR)   . Obesity   . Persistent atrial fibrillation (HCC)    CHADS2VASC score is 2  . Severe obstructive sleep apnea    , CPAP at 14cm H2O  . Varicose veins     Past Surgical History:  Procedure Laterality Date  . CARDIAC SURGERY    . COLONOSCOPY      Current Medications: Current Meds  Medication Sig  . diltiazem (CARDIZEM CD) 240 MG 24 hr capsule Take 1 capsule (240 mg total) by mouth daily. Please keep upcoming appointment 6/21 at 7:40 am for additional refills thanks.  . diltiazem (CARDIZEM CD) 240 MG 24 hr capsule TAKE ONE CAPSULE BY MOUTH ONE TIME DAILY  . fenofibrate (TRICOR) 145 MG tablet Take 145 mg by mouth daily.  [name] lisinopril (PRINIVIL,ZESTRIL) 20 MG tablet Take 20 mg by mouth daily.  .  metoprolol (LOPRESSOR) 100 MG tablet TAKE ONE TABLET BY MOUTH TWICE DAILY  . MULTAQ 400 MG tablet TAKE ONE TABLET BY MOUTH TWICE DAILY  . omeprazole (PRILOSEC) 40 MG capsule Take 40 mg by mouth daily.   . simvastatin (ZOCOR) 10 MG tablet Take 10 mg by mouth daily.  [name] 20 MG TABS tablet TAKE ONE TABLET BY MOUTH ONE TIME DAILY WITH SUPPER    Allergies:   Plavix [clopidogrel bisulfate]   Social History   Social History  . Marital status: Married    Spouse name: N/A  . Number of children: N/A  . Years of education: N/A   Social History Main Topics  . Smoking status: Never Smoker  . Smokeless tobacco: Never Used  . Alcohol use 0.0 oz/week     Comment: occassional beer and wine  . Drug use: No  . Sexual activity: Not Asked   Other Topics Concern  . None   Social History Narrative  . None     Family History:  The patient's family history includes Heart disease in his father; Hypertension in his father and mother.   ROS:   Please see the history of present illness.    ROS All other systems reviewed and are negative.  No flowsheet data found.  PHYSICAL EXAM:   VS:  BP 126/78   Pulse (!) 59   Ht 6\' 3"  (1.905 m)   Wt 295 lb 6.4 oz (134 kg)   BMI 36.92 kg/m    GEN: Well nourished, well developed, in no acute distress  HEENT: normal  Neck: no JVD, carotid bruits, or masses Cardiac: RRR; no murmurs, rubs, or gallops,no edema.  Intact distal pulses bilaterally.  Respiratory:  clear to auscultation bilaterally, normal work of breathing GI: soft, nontender, nondistended, + BS MS: no deformity or atrophy  Skin: warm and dry, no rash Neuro:  Alert and Oriented x 3, Strength and sensation are intact Psych: euthymic mood, full affect  Wt Readings from Last 3 Encounters:  12/10/16 295 lb 6.4 oz (134 kg)  06/09/16 294 lb 6.4 oz (133.5 kg)  03/11/16 283 lb 12.8 oz (128.7 kg)      Studies/Labs Reviewed:   EKG:  EKG is ordered today and showed sinus bradycardia  at 50bpm with RBBB  Recent Labs: 03/11/2016: ALT 22; BUN 20; Creat 0.99; Hemoglobin 14.0; Platelets 218; Potassium 4.4; Sodium 141   Lipid Panel    Component Value Date/Time   CHOL  04/11/2009 0451    105        ATP III CLASSIFICATION:  <200     mg/dL   Desirable  161-096  mg/dL   Borderline High  >=045    mg/dL   High          TRIG 98 04/11/2009 0451   HDL 23 (L) 04/11/2009 0451   CHOLHDL 4.6 04/11/2009 0451   VLDL 20 04/11/2009 0451   LDLCALC  04/11/2009 0451    62        Total Cholesterol/HDL:CHD Risk Coronary Heart Disease Risk Table                     Men   Women  1/2 Average Risk   3.4   3.3  Average Risk       5.0   4.4  2 X Average Risk   9.6   7.1  3 X Average Risk  23.4   11.0        Use the calculated Patient Ratio above and the CHD Risk Table to determine the patient's CHD Risk.        ATP III CLASSIFICATION (LDL):  <100     mg/dL   Optimal  409-811  mg/dL   Near or Above                    Optimal  130-159  mg/dL   Borderline  914-782  mg/dL   High  >956     mg/dL   Very High    Additional studies/ records that were reviewed today include:  CPAP download    ASSESSMENT:    1. Persistent atrial fibrillation (HCC)   2. Essential hypertension   3. Severe obstructive sleep apnea      PLAN:  In order of problems listed above:  1.  Persistent atrial fibrillation - he is maintaining NSR. He will continue on Multaq 400mg  BID, Lopressor, Cardizem and Xarelto 20mg  daily.  2.  HTN- His BP is adequately controlled on exam today.  He will continue on Cardizem 240mg  daily, Metoprolol 100mg  daily and Lisinopril 20mg  daily.   3.  OSA - the patient is tolerating PAP therapy well without any problems. The PAP download was reviewed today and showed an AHI of 2.4/hr  on 15 cm H2O with 63% compliance in using more than 4 hours nightly.  The patient has been using and benefiting from CPAP use and will continue to benefit from therapy.     Medication  Adjustments/Labs and Tests Ordered: Current medicines are reviewed at length with the patient today.  Concerns regarding medicines are outlined above.  Medication changes, Labs and Tests ordered today are listed in the Patient Instructions below.  There are no Patient Instructions on file for this visit.   Signed, [name], MD  12/10/2016 7:43 AM    [facility] [address] [place], [place], [place]  [address] Phone: [phone]; Fax: [phone]

## 2016-12-10 NOTE — Patient Instructions (Signed)

## 2017-01-13 ENCOUNTER — Other Ambulatory Visit: Payer: Self-pay | Admitting: Cardiology

## 2017-01-13 NOTE — Telephone Encounter (Signed)
Request received for Xarelto 20mg ; Crea-0.99 on 03/11/16, Wt-134kg, Age-27, CrCl-163.61ml/min & last seen by Dr. Mayford Knife on 12/10/16. Will send in refill request to requested Pharmacy.

## 2017-02-17 ENCOUNTER — Other Ambulatory Visit: Payer: Self-pay | Admitting: Cardiology

## 2017-02-17 NOTE — Telephone Encounter (Signed)
diltiazem (CARDIZEM CD) 240 MG 24 hr capsule  Medication  Date: 11/20/2016 Department: [facility] [address] Office Ordering/Authorizing: [name], MD  Order Providers   Prescribing Provider Encounter Provider  [name], MD [name], MD  Medication Detail    Disp Refills Start End   diltiazem (CARDIZEM CD) 240 MG 24 hr capsule 90 capsule 1 11/20/2016    Sig: TAKE ONE CAPSULE BY MOUTH ONE TIME DAILY   Sent to pharmacy as: diltiazem (CARDIZEM CD) 240 MG 24 hr capsule   E-Prescribing Status: Receipt confirmed by pharmacy (11/20/2016 11:16 AM EDT)   Pharmacy   [facility] [phone] - [place], [place] - [address]   Refills already on file. Not necessary

## 2017-03-18 ENCOUNTER — Other Ambulatory Visit: Payer: Self-pay | Admitting: Cardiology

## 2017-07-08 ENCOUNTER — Ambulatory Visit: Payer: BC Managed Care – PPO | Admitting: Cardiology

## 2017-07-24 ENCOUNTER — Other Ambulatory Visit: Payer: Self-pay | Admitting: Cardiology

## 2017-08-15 ENCOUNTER — Other Ambulatory Visit: Payer: Self-pay | Admitting: Cardiology

## 2017-08-17 ENCOUNTER — Other Ambulatory Visit: Payer: Self-pay | Admitting: *Deleted

## 2017-08-22 NOTE — Progress Notes (Signed)
Cardiology Office Note:    Date:  08/23/2017   ID:  Geoffrey Robinson, DOB 1961-04-09, MRN [id number]  PCP:  [name], MD  Cardiologist:  No primary care provider on file.    Referring MD: [name], MD   Chief Complaint  Patient presents with  . Sleep Apnea  . Hypertension  . Atrial Fibrillation    History of Present Illness:    Geoffrey Robinson is a 57 y.o. male with a hx of severe OSA on CPAP, obesity, HTN and persistent atrial fibrillation on chronic anticoagulation,  He is here today for followup and is doing well.  He denies any chest pain or pressure, SOB, DOE, PND, orthopnea, LE edema, dizziness, palpitations or syncope. He is compliant with his meds and is tolerating meds with no SE.  He is doing well with his CPAP device.[name]  He tolerates the mask and feels the pressure is adequate.  Since going on CPAP he feels rested in the am and has no significant daytime sleepiness.  He has had some sinus problems so his compliance has been down.  He does not think that he snores.     Past Medical History:  Diagnosis Date  . Bradycardia 10/21/2015  . Dyslipidemia   . HTN (hypertension)   . NICM (nonischemic cardiomyopathy) (HCC)    a. LHC 2002: no CAD, EF 35-40%; b. Echo 2003: EF 60% - tachycardia mediated (AFlutter with RVR)   . Obesity   . Persistent atrial fibrillation (HCC)    CHADS2VASC score is 2  . Severe obstructive sleep apnea    , CPAP at 14cm H2O  . Varicose veins     Past Surgical History:  Procedure Laterality Date  . CARDIAC SURGERY    . COLONOSCOPY      Current Medications: Current Meds  Medication Sig  . diltiazem (CARDIZEM CD) 240 MG 24 hr capsule TAKE 1 CAPSULE BY MOUTH EVERY DAY  . fenofibrate (TRICOR) 145 MG tablet Take 145 mg by mouth daily.  [name] lisinopril (PRINIVIL,ZESTRIL) 20 MG tablet Take 20 mg by mouth daily.  . metoprolol (LOPRESSOR) 100 MG tablet TAKE ONE TABLET BY MOUTH TWICE DAILY  . MULTAQ 400 MG tablet TAKE ONE TABLET BY MOUTH TWICE  DAILY  . omeprazole (PRILOSEC) 40 MG capsule Take 40 mg by mouth daily.   . simvastatin (ZOCOR) 10 MG tablet Take 10 mg by mouth daily.  [name] 20 MG TABS tablet TAKE ONE TABLET BY MOUTH ONE TIME DAILY WITH SUPPER     Allergies:   Plavix [clopidogrel bisulfate]   Social History   Socioeconomic History  . Marital status: Married    Spouse name: None  . Number of children: None  . Years of education: None  . Highest education level: None  Social Needs  . Financial resource strain: None  . Food insecurity - worry: None  . Food insecurity - inability: None  . Transportation needs - medical: None  . Transportation needs - non-medical: None  Occupational History  . None  Tobacco Use  . Smoking status: Never Smoker  . Smokeless tobacco: Never Used  Substance and Sexual Activity  . Alcohol use: Yes    Alcohol/week: 0.0 oz    Comment: occassional beer and wine  . Drug use: No  . Sexual activity: None  Other Topics Concern  . None  Social History Narrative  . None     Family History: The patient's family history includes Heart disease in his  father; Hypertension in his father and mother.  ROS:   Please see the history of present illness.    ROS  All other systems reviewed and negative.   EKGs/Labs/Other Studies Reviewed:    The following studies were reviewed today: CPAP download  EKG:  EKG is ordered today and showed NSR with RBBB Recent Labs: No results found for requested labs within last 8760 hours.   Recent Lipid Panel    Component Value Date/Time   CHOL  04/11/2009 0451    105        ATP III CLASSIFICATION:  <200     mg/dL   Desirable  629-476  mg/dL   Borderline High  >=546    mg/dL   High          TRIG 98 04/11/2009 0451   HDL 23 (L) 04/11/2009 0451   CHOLHDL 4.6 04/11/2009 0451   VLDL 20 04/11/2009 0451   LDLCALC  04/11/2009 0451    62        Total Cholesterol/HDL:CHD Risk Coronary Heart Disease Risk Table                     Men   Women   1/2 Average Risk   3.4   3.3  Average Risk       5.0   4.4  2 X Average Risk   9.6   7.1  3 X Average Risk  23.4   11.0        Use the calculated Patient Ratio above and the CHD Risk Table to determine the patient's CHD Risk.        ATP III CLASSIFICATION (LDL):  <100     mg/dL   Optimal  503-546  mg/dL   Near or Above                    Optimal  130-159  mg/dL   Borderline  568-127  mg/dL   High  >517     mg/dL   Very High    Physical Exam:    VS:  BP (!) 142/76   Pulse (!) 55   Ht 6\' 3"  (1.905 m)   Wt (!) 314 lb 3.2 oz (142.5 kg)   SpO2 98%   BMI 39.27 kg/m     Wt Readings from Last 3 Encounters:  08/23/17 (!) 314 lb 3.2 oz (142.5 kg)  12/10/16 295 lb 6.4 oz (134 kg)  06/09/16 294 lb 6.4 oz (133.5 kg)     GEN:  Well nourished, well developed in no acute distress HEENT: Normal NECK: No JVD; No carotid bruits LYMPHATICS: No lymphadenopathy CARDIAC: 1.  RRR, no murmurs, rubs, gallops RESPIRATORY:  Clear to auscultation without rales, wheezing or rhonchi  ABDOMEN: Soft, non-tender, non-distended MUSCULOSKELETAL:  No edema; No deformity  SKIN: Warm and dry NEUROLOGIC:  Alert and oriented x 3 PSYCHIATRIC:  Normal affect   ASSESSMENT:    1. Persistent atrial fibrillation (HCC)   2. Essential hypertension   3. Severe obstructive sleep apnea   4. Class 2 severe obesity due to excess calories with serious comorbidity in adult, unspecified BMI (HCC)    PLAN:    In order of problems listed above:  1.  Persistent atrial fibrillation - He will continue on Cardizem CD 240mg  daily, Lopressor 100mg  BID, Multaq 400mg  BID and Xarelto 20mg  daily.  I will check a BMET and CBC.  2.  HTN - his BP is well controlled on  exam today.  He will continue on Cardizem CD 240mg  daily, lopressor 100mg  BID and Lisinopril 20mg  daily    3.  OSA - the patient is tolerating PAP therapy well without any problems. The PAP download was reviewed today and showed an AHI of 2.5/hr on 15 cm H2O with  57% compliance in using more than 4 hours nightly.  The patient has been using and benefiting from PAP use and will continue to benefit from therapy.   4.  Obesity - I have encouraged him to get back into his exercise program and cut back on carbs and portions. He has gained 19lbs since June due to stress of his job and has been eating a lot.   Medication Adjustments/Labs and Tests Ordered: Current medicines are reviewed at length with the patient today.  Concerns regarding medicines are outlined above.  No orders of the defined types were placed in this encounter.  No orders of the defined types were placed in this encounter.   Signed, [name], MD  08/23/2017 8:25 AM    [facility]

## 2017-08-23 ENCOUNTER — Encounter (INDEPENDENT_AMBULATORY_CARE_PROVIDER_SITE_OTHER): Payer: Self-pay

## 2017-08-23 ENCOUNTER — Encounter: Payer: Self-pay | Admitting: Cardiology

## 2017-08-23 ENCOUNTER — Ambulatory Visit: Payer: BC Managed Care – PPO | Admitting: Cardiology

## 2017-08-23 VITALS — BP 142/76 | HR 55 | Ht 75.0 in | Wt 314.2 lb

## 2017-08-23 DIAGNOSIS — G4733 Obstructive sleep apnea (adult) (pediatric): Secondary | ICD-10-CM

## 2017-08-23 DIAGNOSIS — I481 Persistent atrial fibrillation: Secondary | ICD-10-CM

## 2017-08-23 DIAGNOSIS — I10 Essential (primary) hypertension: Secondary | ICD-10-CM

## 2017-08-23 DIAGNOSIS — I4819 Other persistent atrial fibrillation: Secondary | ICD-10-CM

## 2017-08-23 LAB — BASIC METABOLIC PANEL
BUN/Creatinine Ratio: 20 (ref 9–20)
BUN: 17 mg/dL (ref 6–24)
CHLORIDE: 104 mmol/L (ref 96–106)
CO2: 25 mmol/L (ref 20–29)
Calcium: 9.3 mg/dL (ref 8.7–10.2)
Creatinine, Ser: 0.86 mg/dL (ref 0.76–1.27)
GFR calc Af Amer: 114 mL/min/{1.73_m2} (ref >59)
GFR, EST NON AFRICAN AMERICAN: 98 mL/min/{1.73_m2} (ref >59)
GLUCOSE: 120 mg/dL — AB (ref 65–99)
POTASSIUM: 5.1 mmol/L (ref 3.5–5.2)
SODIUM: 142 mmol/L (ref 134–144)

## 2017-08-23 LAB — CBC
Hematocrit: 38.6 % (ref 37.5–51.0)
Hemoglobin: 13.5 g/dL (ref 13.0–17.7)
MCH: 29.2 pg (ref 26.6–33.0)
MCHC: 35 g/dL (ref 31.5–35.7)
MCV: 84 fL (ref 79–97)
PLATELETS: 206 10*3/uL (ref 150–379)
RBC: 4.62 x10E6/uL (ref 4.14–5.80)
RDW: 13.2 % (ref 12.3–15.4)
WBC: 4.5 10*3/uL (ref 3.4–10.8)

## 2017-08-23 NOTE — Addendum Note (Signed)
Addended by: Phineas Semen on: 08/23/2017 10:08 AM   Modules accepted: Orders

## 2017-08-23 NOTE — Patient Instructions (Signed)
Medication Instructions:  Your physician recommends that you continue on your current medications as directed. Please refer to the Current Medication list given to you today.  Labwork: Today for kidney function and complete blood count   Testing/Procedures: None ordered   Follow-Up: Your physician wants you to follow-up in: 6 months with Dr. Mayford Knife. You will receive a reminder letter in the mail two months in advance. If you don't receive a letter, please call our office to schedule the follow-up appointment.  Any Other Special Instructions Will Be Listed Below (If Applicable).  Thank you for choosing [facility]    Lyda Perone, RN  [phone]   If you need a refill on your cardiac medications before your next appointment, please call your pharmacy.

## 2017-09-01 NOTE — Progress Notes (Signed)
Unable to reach patient regarding normal lab results. Letter mailed to patient

## 2017-09-07 ENCOUNTER — Telehealth: Payer: Self-pay | Admitting: Cardiology

## 2017-09-07 NOTE — Telephone Encounter (Signed)
Returned call to patient, unable to leave message. 

## 2017-09-07 NOTE — Telephone Encounter (Signed)
Patient calling, states that he is returning call for lab results. °

## 2017-09-09 NOTE — Telephone Encounter (Signed)
Returned call to patient regarding labs, no answer and no VM. Letter was mailed to patient, will close encounter.

## 2017-12-17 ENCOUNTER — Other Ambulatory Visit: Payer: Self-pay | Admitting: Cardiology

## 2018-05-31 ENCOUNTER — Telehealth: Payer: Self-pay | Admitting: *Deleted

## 2018-05-31 ENCOUNTER — Telehealth: Payer: Self-pay | Admitting: Cardiology

## 2018-05-31 DIAGNOSIS — G4733 Obstructive sleep apnea (adult) (pediatric): Secondary | ICD-10-CM

## 2018-05-31 MED ORDER — RIVAROXABAN 20 MG PO TABS: ORAL_TABLET | ORAL | 1 refills | Status: DC

## 2018-05-31 NOTE — Telephone Encounter (Signed)
ew message        *STAT* If patient is at the pharmacy, call can be transferred to refill team.   1. Which medications need to be refilled? (please list name of each medication and dose if known) XARELTO 20 MG TABS tablet  2. Which pharmacy/location (including street and city if local pharmacy) is medication to be sent to?[id number] [place], [place] - [address]  3. Do they need a 30 day or 90 day supply? 90

## 2018-05-31 NOTE — Telephone Encounter (Signed)
Pt last saw Dr Mayford Knife 08/23/17, last labs 08/23/17 Creat 0.86, age 57, weight 142.5kg, CrCl 197.92, based on CrCl pt is on appropriate dosage of Xarelto 20mg  QD.  Will refill rx.

## 2018-05-31 NOTE — Telephone Encounter (Signed)
Per [facility] patient called in to ask for a new prescription for cpap supplies.  Supplies order placed to [facility].

## 2018-06-08 ENCOUNTER — Telehealth: Payer: Self-pay | Admitting: Cardiology

## 2018-06-08 NOTE — Telephone Encounter (Signed)
New Message       Patient is calling today checking the status on supplies. Pls call and advise.

## 2018-06-09 NOTE — Telephone Encounter (Signed)
Returned patients call no voicemail set up on his cell or home phone.. Patients supplies takes 5 to 7 days business to be delivered.

## 2018-06-16 ENCOUNTER — Encounter: Payer: Self-pay | Admitting: Cardiology

## 2018-07-14 ENCOUNTER — Ambulatory Visit: Payer: BC Managed Care – PPO | Admitting: Cardiology

## 2018-07-14 ENCOUNTER — Encounter: Payer: Self-pay | Admitting: Cardiology

## 2018-07-14 VITALS — BP 136/78 | HR 63 | Ht 75.0 in | Wt 328.0 lb

## 2018-07-14 DIAGNOSIS — I4819 Other persistent atrial fibrillation: Secondary | ICD-10-CM | POA: Diagnosis not present

## 2018-07-14 DIAGNOSIS — G4733 Obstructive sleep apnea (adult) (pediatric): Secondary | ICD-10-CM | POA: Diagnosis not present

## 2018-07-14 DIAGNOSIS — I10 Essential (primary) hypertension: Secondary | ICD-10-CM

## 2018-07-14 LAB — CBC
Hematocrit: 41.1 % (ref 37.5–51.0)
Hemoglobin: 14 g/dL (ref 13.0–17.7)
MCH: 28.5 pg (ref 26.6–33.0)
MCHC: 34.1 g/dL (ref 31.5–35.7)
MCV: 84 fL (ref 79–97)
Platelets: 221 10*3/uL (ref 150–450)
RBC: 4.91 x10E6/uL (ref 4.14–5.80)
RDW: 12.7 % (ref 11.6–15.4)
WBC: 6.1 10*3/uL (ref 3.4–10.8)

## 2018-07-14 LAB — BASIC METABOLIC PANEL
BUN / CREAT RATIO: 17 (ref 9–20)
BUN: 19 mg/dL (ref 6–24)
CALCIUM: 9.6 mg/dL (ref 8.7–10.2)
CO2: 23 mmol/L (ref 20–29)
Chloride: 102 mmol/L (ref 96–106)
Creatinine, Ser: 1.1 mg/dL (ref 0.76–1.27)
GFR, EST AFRICAN AMERICAN: 87 mL/min/{1.73_m2} (ref >59)
GFR, EST NON AFRICAN AMERICAN: 75 mL/min/{1.73_m2} (ref >59)
Glucose: 113 mg/dL — ABNORMAL HIGH (ref 65–99)
Potassium: 5 mmol/L (ref 3.5–5.2)
Sodium: 139 mmol/L (ref 134–144)

## 2018-07-14 NOTE — Patient Instructions (Signed)
Medication Instructions:  Your physician recommends that you continue on your current medications as directed. Please refer to the Current Medication list given to you today.  If you need a refill on your cardiac medications before your next appointment, please call your pharmacy.   Lab work: Today: BMET and CBC  If you have labs (blood work) drawn today and your tests are completely normal, you will receive your results only by: [name] MyChart Message (if you have MyChart) OR . A paper copy in the mail If you have any lab test that is abnormal or we need to change your treatment, we will call you to review the results.  Testing/Procedures: None  Follow-Up: At [facility], you and your health needs are our priority.  As part of our continuing mission to provide you with exceptional heart care, we have created designated Provider Care Teams.  These Care Teams include your primary Cardiologist (physician) and Advanced Practice Providers (APPs -  Physician Assistants and Nurse Practitioners) who all work together to provide you with the care you need, when you need it. You will need a follow up appointment in 1 years.  Please call our office 2 months in advance to schedule this appointment.  You may see Dr. Mayford Knife or one of the following Advanced Practice Providers on your designated Care Team:   [place], PA-C Ronie Spies, PA-C . Jacolyn Reedy, PA-C

## 2018-07-14 NOTE — Progress Notes (Signed)
Cardiology Office Note:    Date:  07/14/2018   ID:  Geoffrey Robinson, DOB [birth year], MRN [id number]  PCP:  [name], MD  Cardiologist:  No primary care provider on file.    Referring MD: [name], MD   Chief Complaint  Patient presents with  . Sleep Apnea  . Hypertension  . Atrial Fibrillation    History of Present Illness:    Geoffrey Robinson is a 58 y.o. male with a hx of severe OSA on CPAP, obesity, HTNand persistent atrial fibrillationon chronic anticoagulation.  He is here today for followup and is doing well.  He denies any chest pain or pressure, SOB, DOE, PND, orthopnea, LE edema, dizziness, palpitations or syncope. He is compliant with his meds and is tolerating meds with no SE.  He is doing well with his CPAP device and thinks that he has gotten used to it.  He tolerates the mask and feels the pressure is adequate.  Since going on CPAP he feels rested in the am and has no significant daytime sleepiness.  He denies any significant mouth or nasal dryness or nasal congestion.  He does not think that he snores.     Past Medical History:  Diagnosis Date  . Bradycardia 10/21/2015  . Dyslipidemia   . HTN (hypertension)   . NICM (nonischemic cardiomyopathy) (HCC)    a. LHC 2002: no CAD, EF 35-40%; b. Echo 2003: EF 60% - tachycardia mediated (AFlutter with RVR)   . Obesity   . Persistent atrial fibrillation    CHADS2VASC score is 2  . Severe obstructive sleep apnea    , CPAP at 14cm H2O  . Varicose veins     Past Surgical History:  Procedure Laterality Date  . CARDIAC SURGERY    . COLONOSCOPY      Current Medications: No outpatient medications have been marked as taking for the 07/14/18 encounter (Office Visit) with [name], MD.     Allergies:   Plavix [clopidogrel bisulfate]   Social History   Socioeconomic History  . Marital status: Married    Spouse name: Not on file  . Number of children: Not on file  . Years of education: Not on file    . Highest education level: Not on file  Occupational History  . Not on file  Social Needs  . Financial resource strain: Not on file  . Food insecurity:    Worry: Not on file    Inability: Not on file  . Transportation needs:    Medical: Not on file    Non-medical: Not on file  Tobacco Use  . Smoking status: Never Smoker  . Smokeless tobacco: Never Used  Substance and Sexual Activity  . Alcohol use: Yes    Alcohol/week: 0.0 standard drinks    Comment: occassional beer and wine  . Drug use: No  . Sexual activity: Not on file  Lifestyle  . Physical activity:    Days per week: Not on file    Minutes per session: Not on file  . Stress: Not on file  Relationships  . Social connections:    Talks on phone: Not on file    Gets together: Not on file    Attends religious service: Not on file    Active member of club or organization: Not on file    Attends meetings of clubs or organizations: Not on file    Relationship status: Not on file  Other Topics Concern  .  Not on file  Social History Narrative  . Not on file     Family History: The patient's family history includes Heart disease in his father; Hypertension in his father and mother.  ROS:   Please see the history of present illness.    ROS  All other systems reviewed and negative.   EKGs/Labs/Other Studies Reviewed:    The following studies were reviewed today: PAP download  EKG:  EKG is not ordered today.    Recent Labs: 08/23/2017: BUN 17; Creatinine, Ser 0.86; Hemoglobin 13.5; Platelets 206; Potassium 5.1; Sodium 142   Recent Lipid Panel    Component Value Date/Time   CHOL  04/11/2009 0451    105        ATP III CLASSIFICATION:  <200     mg/dL   Desirable  161-096  mg/dL   Borderline High  >=045    mg/dL   High          TRIG 98 04/11/2009 0451   HDL 23 (L) 04/11/2009 0451   CHOLHDL 4.6 04/11/2009 0451   VLDL 20 04/11/2009 0451   LDLCALC  04/11/2009 0451    62        Total Cholesterol/HDL:CHD  Risk Coronary Heart Disease Risk Table                     Men   Women  1/2 Average Risk   3.4   3.3  Average Risk       5.0   4.4  2 X Average Risk   9.6   7.1  3 X Average Risk  23.4   11.0        Use the calculated Patient Ratio above and the CHD Risk Table to determine the patient's CHD Risk.        ATP III CLASSIFICATION (LDL):  <100     mg/dL   Optimal  409-811  mg/dL   Near or Above                    Optimal  130-159  mg/dL   Borderline  914-782  mg/dL   High  >956     mg/dL   Very High    Physical Exam:    VS:  There were no vitals taken for this visit.    Wt Readings from Last 3 Encounters:  08/23/17 (!) 314 lb 3.2 oz (142.5 kg)  12/10/16 295 lb 6.4 oz (134 kg)  06/09/16 294 lb 6.4 oz (133.5 kg)     GEN:  Well nourished, well developed in no acute distress HEENT: Normal NECK: No JVD; No carotid bruits LYMPHATICS: No lymphadenopathy CARDIAC: RRR, no murmurs, rubs, gallops RESPIRATORY:  Clear to auscultation without rales, wheezing or rhonchi  ABDOMEN: Soft, non-tender, non-distended MUSCULOSKELETAL:  No edema; No deformity  SKIN: Warm and dry NEUROLOGIC:  Alert and oriented x 3 PSYCHIATRIC:  Normal affect   ASSESSMENT:    1. Severe obstructive sleep apnea   2. Persistent atrial fibrillation   3. Essential hypertension   4. Class 2 severe obesity due to excess calories with serious comorbidity in adult, unspecified BMI (HCC)    PLAN:    In order of problems listed above:  1.  OSA - the patient is tolerating PAP therapy well without any problems. The PAP download was reviewed today and showed an AHI of 3.4/hr on 15 cm H2O with 47% compliance in using more than 4 hours nightly.  The patient  has been using and benefiting from PAP use and will continue to benefit from therapy. I have encouraged him to be more compliant with his device.  His compliance dropped off recently because he had a cold and could not use it for a few days.  2.  Persistent atrial  fibrillation - he is maintaining NSR on exam today.  He will continue on Cardizem CD 240mg  daily, Lopressor 100mg  BID, Multaq 400mg  BID and Xarelto 20mg  daily.  His creatinine was 0.86 a year ago so I will repeat a BMET and CBC today.  He denies any problems with excessive bleeding on DOAC.  3.  HTN - BP is well controlled on exam today.  He will continue on Cardizem, Lopressor and Lisinopril   4.  Obesity -he just started at the gym and will increase his exercise.  Medication Adjustments/Labs and Tests Ordered: Current medicines are reviewed at length with the patient today.  Concerns regarding medicines are outlined above.  No orders of the defined types were placed in this encounter.  No orders of the defined types were placed in this encounter.   Signed, Armanda Magicraci Shannan Garfinkel, MD  07/14/2018 8:41 AM    [facility]

## 2018-08-20 ENCOUNTER — Other Ambulatory Visit: Payer: Self-pay | Admitting: Cardiology

## 2018-11-25 ENCOUNTER — Other Ambulatory Visit: Payer: Self-pay | Admitting: Cardiology

## 2018-11-25 NOTE — Telephone Encounter (Signed)
Prescription refill request for Xarelto received.   Last office visit: Dr. Mayford Knife 07-14-2018  Weight: 148.8 kg (07-14-18) Age: 58 yr M Scr: 1.10 (07-14-2018) CrCl: 160 ml/min  Prescription refill sent.

## 2019-05-31 ENCOUNTER — Telehealth: Payer: Self-pay | Admitting: Cardiology

## 2019-05-31 ENCOUNTER — Ambulatory Visit (HOSPITAL_COMMUNITY): Payer: BC Managed Care – PPO | Admitting: Physician Assistant

## 2019-05-31 NOTE — Telephone Encounter (Signed)
Pt calling in to triage reporting that he has noticed HRs going up more than usual. This has occurred that last week or so.  Goes up to 100/115, HRs usually avg 50s.  Reports that occurrences last from several minutes to couple hours.  Denies light headedness/dizziness, near syncope/syncope, SOB.  Denies any symptoms r/t elevated HRs. His biggest concern is that it "got pretty out of wack there about 8 years ago" and pt does not want to go back through that.  He mentions that Dr. Radford Pax has mentioned going back up on Diltiazem if needed in the future. Pt currently scheduled to see Bhagat, PA on Friday. Advised pt to take his BP (he does not currently check this) over next several days for appointment  Pt aware that I will forward to Dr. Radford Pax for advisement.  Aware that since he is asymptomatic and has an appt Friday that she may advise to continue to monitor until appt Friday. Aware that we can further evaluate what is needed at that appt, maybe med increase or possible holter/event monitor.  Pt is agreeable to waiting until Friday for further advisement, but understands we will call once reviewed by Turner.

## 2019-05-31 NOTE — Telephone Encounter (Signed)
I spoke with afib clinic and pt can be seen there today at 2:30 I spoke with pt. He is unable to come for appointment today.  He prefers to wait until Friday. He reports he is feeling OK.  Pt will see B. Bhagat, PA as planned on 12/11.

## 2019-05-31 NOTE — Telephone Encounter (Signed)
New message    Patient calling to report HR  STAT if HR is under 50 or over 120 (normal HR is 60-100 beats per minute)  1) What is your heart rate? 59-->120  2) Do you have a log of your heart rate readings (document readings)? 59---->120  3) Do you have any other symptoms? afib

## 2019-05-31 NOTE — Telephone Encounter (Signed)
Voicemail not set up on mobile call back number. I left message on home number to call office.

## 2019-05-31 NOTE — Telephone Encounter (Signed)
Please see if you can get into afib clinic today

## 2019-06-01 NOTE — Progress Notes (Signed)
Cardiology Office Note    Date:  06/02/2019   ID:  Geoffrey BiWilliam D Cly, DOB 1961-05-07, MRN [id number]  PCP:  Barbie BannerWilson, Fred H, MD  Cardiologist:  Dr. Mayford Knifeurner EP: Remotely seen by Dr. Ladona Ridgelaylor  Chief Complaint: Elevated HR  History of Present Illness:   Geoffrey Robinson is a 58 y.o. 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   Hx of atrial flutter dating back to 2002 and had aflutter ablation. After wards he developed atrial fibrillation and underwent ablation x 2 by [facility] Sam[facility]Fitzgerald [facility]. He failed. Betapace. He did well on amiodarone after 2nd ablation but eventually discontinued given young age. He was also seen by Dr. Ladona Ridgelaylor in 2010.  He was maintaining sinus rhythm when last seen by Dr. Mayford Knifeurner 08/2017.  Added to my schedule to elevated HR. Patient reports he is taking Maltaq way before 2014 and had prior unsuccessful cardioversion. He is maintaining sinus rhythm since 2014 based on EKG and notes. For the past 2-3 weeks he has noted fluctuating heart rate of 90-130s with fluttering sensation. The patient denies nausea, vomiting, fever, chest pain, palpitations, shortness of breath, orthopnea, PND, dizziness, syncope, cough, congestion, abdominal pain, hematochezia, melena, lower extremity edema. Compliant with his medications and CPAP.  He has gained 30 pounds this year.  Past Medical History:  Diagnosis Date   Bradycardia 10/21/2015   Dyslipidemia    HTN (hypertension)    NICM (nonischemic cardiomyopathy) (HCC)    a. LHC 2002: no CAD, EF 35-40%; b. Echo 2003: EF 60% - tachycardia mediated (AFlutter with RVR)    Obesity    Persistent atrial fibrillation (HCC)    CHADS2VASC score is 2   Severe obstructive sleep apnea    , CPAP at 14cm H2O   Varicose veins     Past Surgical History:    Procedure Laterality Date   CARDIAC SURGERY     COLONOSCOPY      Current Medications: Prior to Admission medications   Medication Sig Start Date End Date Taking? Authorizing Provider  diltiazem (CARDIZEM CD) 240 MG 24 hr capsule TAKE 1 CAPSULE BY MOUTH EVERY DAY 08/17/17   Quintella Reicherturner, Traci R, MD  fenofibrate (TRICOR) 145 MG tablet Take 145 mg by mouth daily.    [provider]  lisinopril (PRINIVIL,ZESTRIL) 20 MG tablet Take 20 mg by mouth daily.    [provider]  metoprolol (LOPRESSOR) 100 MG tablet TAKE ONE TABLET BY MOUTH TWICE DAILY 03/27/15   Turner, Cornelious Bryantraci R, MD  MULTAQ 400 MG tablet TAKE 1 TABLET BY MOUTH TWICE A DAY 08/22/18   Quintella Reicherturner, Traci R, MD  omeprazole (PRILOSEC) 40 MG capsule Take 40 mg by mouth daily.     [provider]  simvastatin (ZOCOR) 10 MG tablet Take 10 mg by mouth daily.    [provider]  XARELTO 20 MG TABS tablet TAKE ONE TABLET BY MOUTH ONE TIME DAILY WITH SUPPER 11/25/18   Quintella Reicherturner, Traci R, MD    Allergies:   Plavix [clopidogrel bisulfate]   Social History   Socioeconomic History   Marital status: Married    Spouse name: Not on file   Number of children: Not on file   Years of education: Not on file   Highest education level: Not on file  Occupational History   Not on file  Tobacco Use  Smoking status: Never Smoker   Smokeless tobacco: Never Used  Substance and Sexual Activity   Alcohol use: Yes    Alcohol/week: 0.0 standard drinks    Comment: occassional beer and wine   Drug use: No   Sexual activity: Not on file  Other Topics Concern   Not on file  Social History Narrative   Not on file   Social Determinants of Health   Financial Resource Strain:    Difficulty of Paying Living Expenses: Not on file  Food Insecurity:    Worried About Running Out of Food in the Last Year: Not on file   Ran Out of Food in the Last Year: Not on file  Transportation Needs:    Lack of Transportation  (Medical): Not on file   Lack of Transportation (Non-Medical): Not on file  Physical Activity:    Days of Exercise per Week: Not on file   Minutes of Exercise per Session: Not on file  Stress:    Feeling of Stress : Not on file  Social Connections:    Frequency of Communication with Friends and Family: Not on file   Frequency of Social Gatherings with Friends and Family: Not on file   Attends Religious Services: Not on file   Active Member of Clubs or Organizations: Not on file   Attends Banker Meetings: Not on file   Marital Status: Not on file     Family History:  The patient's family history includes Heart disease in his father; Hypertension in his father and mother.  ROS:   Please see the history of present illness.    ROS All other systems reviewed and are negative.   PHYSICAL EXAM:   VS:  BP (!) 148/96    Pulse (!) 124    Ht 6\' 3"  (1.905 m)    Wt (!) 357 lb (161.9 kg)    SpO2 98%    BMI 44.62 kg/m    GEN: Well nourished, well developed, in no acute distress  HEENT: normal  Neck: no JVD, carotid bruits, or masses Cardiac: Regular tachycardic; no murmurs, rubs, or gallops, trace to 1+ bilateral lower extremity edema  Respiratory:  clear to auscultation bilaterally, normal work of breathing GI: soft, nontender, nondistended, + BS MS: no deformity or atrophy  Skin: warm and dry, no rash Neuro:  Alert and Oriented x 3, Strength and sensation are intact Psych: euthymic mood, full affect  Wt Readings from Last 3 Encounters:  06/02/19 (!) 357 lb (161.9 kg)  07/14/18 (!) 328 lb (148.8 kg)  08/23/17 (!) 314 lb 3.2 oz (142.5 kg)      Studies/Labs Reviewed:   EKG:  EKG is ordered today.  The ekg ordered today demonstrates atrial flutter at 2-1 conduction at rate of 124 bpm  Recent Labs: 07/14/2018: BUN 19; Creatinine, Ser 1.10; Hemoglobin 14.0; Platelets 221; Potassium 5.0; Sodium 139   Lipid Panel    Component Value Date/Time   CHOL  04/11/2009  0451    105        ATP III CLASSIFICATION:  <200     mg/dL   Desirable  04/13/2009  mg/dL   Borderline High  834-196    mg/dL   High          TRIG 98 04/11/2009 0451   HDL 23 (L) 04/11/2009 0451   CHOLHDL 4.6 04/11/2009 0451   VLDL 20 04/11/2009 0451   LDLCALC  04/11/2009 0451    62  Total Cholesterol/HDL:CHD Risk Coronary Heart Disease Risk Table                     Men   Women  1/2 Average Risk   3.4   3.3  Average Risk       5.0   4.4  2 X Average Risk   9.6   7.1  3 X Average Risk  23.4   11.0        Use the calculated Patient Ratio above and the CHD Risk Table to determine the patient's CHD Risk.        ATP III CLASSIFICATION (LDL):  <100     mg/dL   Optimal  400-867  mg/dL   Near or Above                    Optimal  130-159  mg/dL   Borderline  619-509  mg/dL   High  >326     mg/dL   Very High    Additional studies/ records that were reviewed today include:   As above  ASSESSMENT & PLAN:    1. Atrial flutter with RVR - Prior hx of unsuccessful cardioversion and ablation of afib x 2 and aflutter x 1.  Per review of note and EKG, seems he is maintaining sinus since 2014. He is minimally symptomatic. Compliant with Xarelto, Multaq, Cardizem, lopressor and CPAP. Not interested in repeat Cardioversion. Get Echo.  Discussed with DOD Dr. Ladona Ridgel who has seen patient previously.  Stop Multaq.  Start amiodarone 200 mg twice daily>> consider reducing dose at follow-up.  Increase diltiazem to 360 mg daily.   2. HTN - Minimally up.   3. OSA on CPAP - Compliant   4.  Lower extremity edema -Patient reports chronic mild edema.  He does have prior history of tachycardia mediated cardiomyopathy.  Update echocardiogram.  Suspect if rate not controlled, he will go in heart failure.  Will give as needed Lasix.  5.  Morbid obesity He has gained 30 pounds this year.  I have encouraged low-sodium heart healthy diet.  Regular exercise and weight loss.   Medication  Adjustments/Labs and Tests Ordered: Current medicines are reviewed at length with the patient today.  Concerns regarding medicines are outlined above.  Medication changes, Labs and Tests ordered today are listed in the Patient Instructions below. Patient Instructions  Medication Instructions:  1. STOP MULTAQ 2. START AMIODARONE 200 MG TWICE DAILY; RX HAS BEEN SENT IN 3. RX FOR LASIX 20 MG DAILY ONLY AS NEEDED FOR EXCESS SWELLING; TAKE IF WT IS UP 2-3 LB'S IN 1 DAY OR 5 LB'S IN 1 WEEK 4. INCREASE CARDIZEM TO 360 MG DAILY; NEW RX HAS BEEN SENT IN *If you need a refill on your cardiac medications before your next appointment, please call your pharmacy*  Lab Work: NONE ORDERED If you have labs (blood work) drawn today and your tests are completely normal, you will receive your results only by:  MyChart Message (if you have MyChart) OR  A paper copy in the mail If you have any lab test that is abnormal or we need to change your treatment, we will call you to review the results.  Testing/Procedures: ECHO TO BE DONE BEFORE WITH DR. Ladona Ridgel  Follow-Up: At [facility], you and your health needs are our priority.  As part of our continuing mission to provide you with exceptional heart care, we have created designated Provider Care Teams.  These Care  Teams include your primary Cardiologist (physician) and Advanced Practice Providers (APPs -  Physician Assistants and Nurse Practitioners) who all work together to provide you with the care you need, when you need it.  Your next appointment:   2 month(s)  The format for your next appointment:   In Person  Provider:   Cristopher Peru, MD  Other Instructions YOU ARE BEING REFERRED TO THE A-FIB CLINIC; PER VIN Trinten Boudoin, PAC YOU WILL NEED TO BE SEEN BEFORE CHRISTMAS     Jarrett Soho, PA  06/02/2019 9:03 AM    [facility] [address], [place], [place]  [address] Phone: [phone]; Fax: [phone]

## 2019-06-02 ENCOUNTER — Other Ambulatory Visit: Payer: Self-pay

## 2019-06-02 ENCOUNTER — Encounter: Payer: Self-pay | Admitting: Physician Assistant

## 2019-06-02 ENCOUNTER — Ambulatory Visit: Payer: BC Managed Care – PPO | Admitting: Physician Assistant

## 2019-06-02 VITALS — BP 148/96 | HR 124 | Ht 75.0 in | Wt 357.0 lb

## 2019-06-02 DIAGNOSIS — R600 Localized edema: Secondary | ICD-10-CM

## 2019-06-02 DIAGNOSIS — I10 Essential (primary) hypertension: Secondary | ICD-10-CM

## 2019-06-02 DIAGNOSIS — I4892 Unspecified atrial flutter: Secondary | ICD-10-CM | POA: Diagnosis not present

## 2019-06-02 DIAGNOSIS — I480 Paroxysmal atrial fibrillation: Secondary | ICD-10-CM

## 2019-06-02 DIAGNOSIS — G4733 Obstructive sleep apnea (adult) (pediatric): Secondary | ICD-10-CM | POA: Diagnosis not present

## 2019-06-02 MED ORDER — FUROSEMIDE 20 MG PO TABS: 20.0000 mg | ORAL_TABLET | Freq: Every day | ORAL | 0 refills | Status: DC | PRN

## 2019-06-02 MED ORDER — AMIODARONE HCL 200 MG PO TABS: 200.0000 mg | ORAL_TABLET | Freq: Two times a day (BID) | ORAL | 3 refills | Status: DC

## 2019-06-02 MED ORDER — DILTIAZEM HCL ER COATED BEADS 360 MG PO CP24: 360.0000 mg | ORAL_CAPSULE | Freq: Every day | ORAL | 30 refills | Status: DC

## 2019-06-02 NOTE — Patient Instructions (Addendum)
Medication Instructions:  1. STOP MULTAQ 2. START AMIODARONE 200 MG TWICE DAILY; RX HAS BEEN SENT IN 3. RX FOR LASIX 20 MG DAILY ONLY AS NEEDED FOR EXCESS SWELLING; TAKE IF WT IS UP 2-3 LB'S IN 1 DAY OR 5 LB'S IN 1 WEEK 4. INCREASE CARDIZEM TO 360 MG DAILY; NEW RX HAS BEEN SENT IN *If you need a refill on your cardiac medications before your next appointment, please call your pharmacy*  Lab Work: NONE ORDERED If you have labs (blood work) drawn today and your tests are completely normal, you will receive your results only by: [name] MyChart Message (if you have MyChart) OR . A paper copy in the mail If you have any lab test that is abnormal or we need to change your treatment, we will call you to review the results.  Testing/Procedures: ECHO TO BE DONE BEFORE WITH DR. Lovena Le  Follow-Up: At [facility], you and your health needs are our priority.  As part of our continuing mission to provide you with exceptional heart care, we have created designated Provider Care Teams.  These Care Teams include your primary Cardiologist (physician) and Advanced Practice Providers (APPs -  Physician Assistants and Nurse Practitioners) who all work together to provide you with the care you need, when you need it.  Your next appointment:   2 month(s)  The format for your next appointment:   In Person  Provider:   Cristopher Peru, MD  Other Instructions YOU ARE BEING REFERRED TO THE A-FIB CLINIC; PER VIN BHAGAT, PAC YOU WILL NEED TO BE SEEN BEFORE CHRISTMAS

## 2019-06-09 ENCOUNTER — Other Ambulatory Visit: Payer: Self-pay | Admitting: Cardiology

## 2019-06-09 NOTE — Telephone Encounter (Signed)
Prescription refill request for Xarelto received.   Last office visit: Vin , 06/02/2019 Weight: 161.9 kg, 06/09/2019 Age: 58 yo  Scr: 1.10 07/14/2018 CrCl: 174 ml/min   Prescription refill sent.

## 2019-06-10 ENCOUNTER — Other Ambulatory Visit: Payer: Self-pay | Admitting: Cardiology

## 2019-06-12 ENCOUNTER — Encounter (HOSPITAL_COMMUNITY): Payer: Self-pay | Admitting: Physician Assistant

## 2019-06-12 ENCOUNTER — Ambulatory Visit (HOSPITAL_COMMUNITY)
Admission: RE | Admit: 2019-06-12 | Discharge: 2019-06-12 | Disposition: A | Discharge status: Home / Self Care | Payer: BC Managed Care – PPO | Source: Ambulatory Visit | Attending: Physician Assistant | Admitting: Physician Assistant

## 2019-06-12 ENCOUNTER — Other Ambulatory Visit: Payer: Self-pay

## 2019-06-12 VITALS — BP 142/100 | HR 130 | Ht 75.0 in | Wt 352.2 lb

## 2019-06-12 DIAGNOSIS — I10 Essential (primary) hypertension: Secondary | ICD-10-CM | POA: Diagnosis not present

## 2019-06-12 DIAGNOSIS — Z79899 Other long term (current) drug therapy: Secondary | ICD-10-CM | POA: Diagnosis not present

## 2019-06-12 DIAGNOSIS — E785 Hyperlipidemia, unspecified: Secondary | ICD-10-CM | POA: Diagnosis not present

## 2019-06-12 DIAGNOSIS — R9431 Abnormal electrocardiogram [ECG] [EKG]: Secondary | ICD-10-CM | POA: Insufficient documentation

## 2019-06-12 DIAGNOSIS — E669 Obesity, unspecified: Secondary | ICD-10-CM | POA: Diagnosis not present

## 2019-06-12 DIAGNOSIS — G4733 Obstructive sleep apnea (adult) (pediatric): Secondary | ICD-10-CM | POA: Insufficient documentation

## 2019-06-12 DIAGNOSIS — Z888 Allergy status to other drugs, medicaments and biological substances status: Secondary | ICD-10-CM | POA: Diagnosis not present

## 2019-06-12 DIAGNOSIS — Z6841 Body Mass Index (BMI) 40.0 and over, adult: Secondary | ICD-10-CM | POA: Diagnosis not present

## 2019-06-12 DIAGNOSIS — I480 Paroxysmal atrial fibrillation: Secondary | ICD-10-CM | POA: Diagnosis present

## 2019-06-12 DIAGNOSIS — Z7901 Long term (current) use of anticoagulants: Secondary | ICD-10-CM | POA: Insufficient documentation

## 2019-06-12 DIAGNOSIS — Z8249 Family history of ischemic heart disease and other diseases of the circulatory system: Secondary | ICD-10-CM | POA: Diagnosis not present

## 2019-06-12 DIAGNOSIS — I428 Other cardiomyopathies: Secondary | ICD-10-CM | POA: Insufficient documentation

## 2019-06-12 DIAGNOSIS — R000 Tachycardia, unspecified: Secondary | ICD-10-CM | POA: Insufficient documentation

## 2019-06-12 DIAGNOSIS — I4510 Unspecified right bundle-branch block: Secondary | ICD-10-CM | POA: Diagnosis not present

## 2019-06-12 DIAGNOSIS — D6869 Other thrombophilia: Secondary | ICD-10-CM

## 2019-06-12 DIAGNOSIS — I4892 Unspecified atrial flutter: Secondary | ICD-10-CM | POA: Diagnosis not present

## 2019-06-12 LAB — COMPREHENSIVE METABOLIC PANEL
ALT: 39 U/L (ref 0–44)
AST: 31 U/L (ref 15–41)
Albumin: 3.8 g/dL (ref 3.5–5.0)
Alkaline Phosphatase: 70 U/L (ref 38–126)
Anion gap: 12 (ref 5–15)
BUN: 16 mg/dL (ref 6–20)
CO2: 22 mmol/L (ref 22–32)
Calcium: 9.1 mg/dL (ref 8.9–10.3)
Chloride: 108 mmol/L (ref 98–111)
Creatinine, Ser: 0.95 mg/dL (ref 0.61–1.24)
GFR calc Af Amer: >60 mL/min (ref >60)
GFR calc non Af Amer: >60 mL/min (ref >60)
Glucose, Bld: 192 mg/dL — ABNORMAL HIGH (ref 70–99)
Potassium: 4.8 mmol/L (ref 3.5–5.1)
Sodium: 142 mmol/L (ref 135–145)
Total Bilirubin: 0.9 mg/dL (ref 0.3–1.2)
Total Protein: 6.7 g/dL (ref 6.5–8.1)

## 2019-06-12 LAB — TSH: TSH: 1.563 u[IU]/mL (ref 0.350–4.500)

## 2019-06-12 MED ORDER — METOPROLOL TARTRATE 100 MG PO TABS: ORAL_TABLET | ORAL | 5 refills | Status: DC

## 2019-06-12 NOTE — Progress Notes (Signed)
Primary Care Physician: Christain Sacramento, MD Primary Cardiologist: Dr Radford Pax Primary Electrophysiologist: Dr Lovena Le Referring Physician: Dr Oneita Hurt is a 58 y.o. 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 Patient had aflutter ablation 2002 and afterwards developed atrial fibrillation and underwent ablation x 2 by Dr. Ola Spurr at [facility]. He did well on amiodarone after 2nd ablation but eventually discontinued given young age. He is on Xarelto for a CHADS2VASC score of 2. He reports that about 2-3 weeks ago he was sitting at the dinner table and "just felt odd." He noted on his FitBit that his heart rate was elevated. There were no specific triggers that the patient could identify. He denies significant alcohol use and is compliant with his CPAP. He does report that he feels he was in SR last night with symptomatic improvement and a measured HR in the 60s.  Today, he denies symptoms of palpitations, chest pain, shortness of breath, orthopnea, PND, lower extremity edema, dizziness, presyncope, syncope, snoring, daytime somnolence, bleeding, or neurologic sequela. The patient is tolerating medications without difficulties and is otherwise without complaint today.    Atrial Fibrillation Risk Factors:  he does have symptoms or diagnosis of sleep apnea. he is compliant with CPAP therapy. he does not have a history of rheumatic fever. he does not have a history of alcohol use. The patient does have a history of early familial atrial fibrillation or other arrhythmias. Father and sister have afib.  he has a BMI of Body mass index is 44.02 kg/m.[name] Filed Weights   06/12/19 0854  Weight: (!) 159.8 kg    Family History  Problem Relation Age of Onset  . Hypertension Mother   .  Hypertension Father   . Heart disease Father      Atrial Fibrillation Management history:  Previous antiarrhythmic drugs: sotalol, amiodarone, Multaq Previous cardioversions: remotely Previous ablations: 2002 flutter, fib x2 at [place] score: 2 Anticoagulation history: Xarelto   Past Medical History:  Diagnosis Date  . Bradycardia 10/21/2015  . Dyslipidemia   . HTN (hypertension)   . NICM (nonischemic cardiomyopathy) ([place])    a. [place] 2002: no CAD, EF 35-40%; b. Echo 2003: EF 60% - tachycardia mediated (AFlutter with RVR)   . Obesity   . Persistent atrial fibrillation (HCC)    CHADS2VASC score is 2  . Severe obstructive sleep apnea    , CPAP at 14cm H2O  . Varicose veins    Past Surgical History:  Procedure Laterality Date  . CARDIAC SURGERY    . COLONOSCOPY      Current Outpatient Medications  Medication Sig Dispense Refill  . amiodarone (PACERONE) 200 MG tablet Take 1 tablet (200 mg total) by mouth 2 (two) times daily. 180 tablet 3  . diltiazem (CARDIZEM CD) 360 MG 24 hr capsule Take 1 capsule (360 mg total) by mouth daily. 90 capsule 30  . fenofibrate (TRICOR) 145 MG tablet Take 145 mg by mouth daily.    . furosemide (LASIX) 20 MG tablet Take 1 tablet (20 mg total) by mouth daily as needed for edema (take as needed for excess swelling; wt gain 2-3 lb's x 1 day or 5 lb's x 1 week). 30 tablet 0  . lisinopril (PRINIVIL,ZESTRIL) 20 MG tablet Take 20 mg by mouth daily.    . metoprolol (LOPRESSOR) 100  MG tablet TAKE ONE TABLET BY MOUTH TWICE DAILY 60 tablet 5  . omeprazole (PRILOSEC) 40 MG capsule Take 40 mg by mouth daily.     . simvastatin (ZOCOR) 10 MG tablet Take 10 mg by mouth daily.    [name] 20 MG TABS tablet TAKE ONE TABLET BY MOUTH ONE TIME DAILY WITH SUPPER 90 tablet 1   No current facility-administered medications for this encounter.    Allergies  Allergen Reactions  . Plavix [Clopidogrel Bisulfate] Rash    Social History   Socioeconomic History   . Marital status: Married    Spouse name: Not on file  . Number of children: Not on file  . Years of education: Not on file  . Highest education level: Not on file  Occupational History  . Not on file  Tobacco Use  . Smoking status: Never Smoker  . Smokeless tobacco: Never Used  Substance and Sexual Activity  . Alcohol use: Yes    Alcohol/week: 3.0 standard drinks    Types: 1 Standard drinks or equivalent, 1 Cans of beer, 1 Glasses of wine per week    Comment: occassional beer and wine  . Drug use: No  . Sexual activity: Not on file  Other Topics Concern  . Not on file  Social History Narrative  . Not on file   Social Determinants of Health   Financial Resource Strain:   . Difficulty of Paying Living Expenses: Not on file  Food Insecurity:   . Worried About Programme researcher, broadcasting/film/video in the Last Year: Not on file  . Ran Out of Food in the Last Year: Not on file  Transportation Needs:   . Lack of Transportation (Medical): Not on file  . Lack of Transportation (Non-Medical): Not on file  Physical Activity:   . Days of Exercise per Week: Not on file  . Minutes of Exercise per Session: Not on file  Stress:   . Feeling of Stress : Not on file  Social Connections:   . Frequency of Communication with Friends and Family: Not on file  . Frequency of Social Gatherings with Friends and Family: Not on file  . Attends Religious Services: Not on file  . Active Member of Clubs or Organizations: Not on file  . Attends Banker Meetings: Not on file  . Marital Status: Not on file  Intimate Partner Violence:   . Fear of Current or Ex-Partner: Not on file  . Emotionally Abused: Not on file  . Physically Abused: Not on file  . Sexually Abused: Not on file     ROS- All systems are reviewed and negative except as per the HPI above.  Physical Exam: Vitals:   06/12/19 0854  BP: (!) 142/100  Pulse: (!) 130  Weight: (!) 159.8 kg  Height: 6\' 3"  (1.905 m)    GEN- The patient  is well appearing obese male, alert and oriented x 3 today.   Head- normocephalic, atraumatic Eyes-  Sclera clear, conjunctiva pink Ears- hearing intact Oropharynx- clear Neck- supple  Lungs- Clear to ausculation bilaterally, normal work of breathing Heart- Regular rhythm, tachycardia, no murmurs, rubs or gallops  GI- soft, NT, ND, + BS Extremities- no clubbing, cyanosis. Trace edema R>L MS- no significant deformity or atrophy Skin- no rash or lesion Psych- euthymic mood, full affect Neuro- strength and sensation are intact  Wt Readings from Last 3 Encounters:  06/12/19 (!) 159.8 kg  06/02/19 (!) 161.9 kg  07/14/18 (!) 148.8 kg  EKG today demonstrates atrial flutter HR 130, RBBB QRS 146, QTc 553  Epic records are reviewed at length today  Assessment and Plan:  1. Paroxsymal atrial fibrillation/atrial flutter Patient in rapid atrial flutter today.  Will increase metoprolol to 100 mg AM and 150 mg PM. Continue diltiazem 360 mg daily. Continue amiodarone 200 mg BID. Hopefully, he will convert on this medication. We did discuss the possibility that he may need DCCV given his rapid rate and h/o tachycardia mediated CM. Patient voices understanding.  Check TSH/Cmet today. Echocardiogram pending Continue Xarelto 20 mg daily  This patients CHA2DS2-VASc Score and unadjusted Ischemic Stroke Rate (% per year) is equal to 2.2 % stroke rate/year from a score of 2  Above score calculated as 1 point each if present [CHF, HTN, DM, Vascular=MI/PAD/Aortic Plaque, Age if 65-74, or Male] Above score calculated as 2 points each if present [Age > 75, or Stroke/TIA/TE]   2. Obesity Body mass index is 44.02 kg/m. Lifestyle modification was discussed at length including regular exercise and weight reduction. Patient has gained a significant about of weight this year.  3. Obstructive sleep apnea The importance of adequate treatment of sleep apnea was discussed today in order to improve our  ability to maintain sinus rhythm long term. Patient compliant with CPAP therapy.  4. HTN Elevated today, med changes as above.  5. NICM Likely tachycardia mediated. No signs or symptoms of fluid overload today.   Follow up with Dr Ladona Ridgel and Dr Mayford Knife as scheduled.    Jorja Loa PA-C [facility] [address] [place], [place] [address] [phone] 06/12/2019 9:25 AM

## 2019-06-12 NOTE — Patient Instructions (Signed)
Increase Metoprolol 1 Tablet AM and 1.5 Tablets PM  Continue Amiodarone

## 2019-06-20 ENCOUNTER — Telehealth: Payer: Self-pay | Admitting: *Deleted

## 2019-06-20 NOTE — Telephone Encounter (Signed)

## 2019-06-24 ENCOUNTER — Other Ambulatory Visit: Payer: Self-pay | Admitting: Physician Assistant

## 2019-07-04 ENCOUNTER — Other Ambulatory Visit: Payer: Self-pay

## 2019-07-04 ENCOUNTER — Ambulatory Visit (HOSPITAL_COMMUNITY): Payer: BC Managed Care – PPO | Attending: Cardiology

## 2019-07-04 DIAGNOSIS — I4892 Unspecified atrial flutter: Secondary | ICD-10-CM | POA: Insufficient documentation

## 2019-07-11 ENCOUNTER — Encounter: Payer: Self-pay | Admitting: Internal Medicine

## 2019-07-11 ENCOUNTER — Ambulatory Visit: Payer: BC Managed Care – PPO | Admitting: Internal Medicine

## 2019-07-11 ENCOUNTER — Other Ambulatory Visit: Payer: Self-pay

## 2019-07-11 VITALS — BP 138/88 | HR 108 | Ht 75.0 in | Wt 354.0 lb

## 2019-07-11 DIAGNOSIS — I4819 Other persistent atrial fibrillation: Secondary | ICD-10-CM

## 2019-07-11 DIAGNOSIS — I4892 Unspecified atrial flutter: Secondary | ICD-10-CM | POA: Diagnosis not present

## 2019-07-11 NOTE — Progress Notes (Signed)
HPI [name] returns today after an over 15 year absence from our EP clinic. He is a pleasant obese 59 yo man with a h/o HTN who developed atrial flutter and had an ablation followed by the development of atrial fib and an atrial fib ablation. He initially did well and then was placed on Multaq. He has had recurrent LA flutter and has been placed back on amiodarone in conjunction with metoprolol and cardizem. He had a 2D echo which demonstrated that his EF has gone down into the 35-40% range. He has class 2 symptoms.  Allergies  Allergen Reactions  . Plavix [Clopidogrel Bisulfate] Rash     Current Outpatient Medications  Medication Sig Dispense Refill  . amiodarone (PACERONE) 200 MG tablet Take 1 tablet (200 mg total) by mouth 2 (two) times daily. 180 tablet 3  . diltiazem (CARDIZEM CD) 360 MG 24 hr capsule Take 1 capsule (360 mg total) by mouth daily. 90 capsule 30  . fenofibrate (TRICOR) 145 MG tablet Take 145 mg by mouth daily.    . furosemide (LASIX) 20 MG tablet TAKE 1 TABLET BY MOUTH DAILY AS NEEDED FOR EDEMA (TAKE AS NEEDED FOR EXCESS SWELLING WT GAIN 2-3 LB'S X 1 DAY OR 5 LB'S X 1 WEEK). 30 tablet 3  . lisinopril (PRINIVIL,ZESTRIL) 20 MG tablet Take 20 mg by mouth daily.    . metoprolol tartrate (LOPRESSOR) 100 MG tablet Take 1 Tablet AM and 1.5 Tablet PM 75 tablet 5  . omeprazole (PRILOSEC) 40 MG capsule Take 40 mg by mouth daily.     . simvastatin (ZOCOR) 10 MG tablet Take 10 mg by mouth daily.    Alveda Reasons 20 MG TABS tablet TAKE ONE TABLET BY MOUTH ONE TIME DAILY WITH SUPPER 90 tablet 1   No current facility-administered medications for this visit.     Past Medical History:  Diagnosis Date  . Bradycardia 10/21/2015  . Dyslipidemia   . HTN (hypertension)   . NICM (nonischemic cardiomyopathy) ([place])    a. [place] 2002: no CAD, EF 35-40%; b. Echo 2003: EF 60% - tachycardia mediated (AFlutter with RVR)   . Obesity   . Persistent atrial fibrillation (HCC)    CHADS2VASC  score is 2  . Severe obstructive sleep apnea    , CPAP at 14cm H2O  . Varicose veins     ROS:   All systems reviewed and negative except as noted in the HPI.   Past Surgical History:  Procedure Laterality Date  . CARDIAC SURGERY    . COLONOSCOPY       Family History  Problem Relation Age of Onset  . Hypertension Mother   . Hypertension Father   . Heart disease Father      Social History   Socioeconomic History  . Marital status: Married    Spouse name: Not on file  . Number of children: Not on file  . Years of education: Not on file  . Highest education level: Not on file  Occupational History  . Not on file  Tobacco Use  . Smoking status: Never Smoker  . Smokeless tobacco: Never Used  Substance and Sexual Activity  . Alcohol use: Yes    Alcohol/week: 3.0 standard drinks    Types: 1 Standard drinks or equivalent, 1 Cans of beer, 1 Glasses of wine per week    Comment: occassional beer and wine  . Drug use: No  . Sexual activity: Not on file  Other Topics Concern  .  Not on file  Social History Narrative  . Not on file   Social Determinants of Health   Financial Resource Strain:   . Difficulty of Paying Living Expenses: Not on file  Food Insecurity:   . Worried About Programme researcher, broadcasting/film/video in the Last Year: Not on file  . Ran Out of Food in the Last Year: Not on file  Transportation Needs:   . Lack of Transportation (Medical): Not on file  . Lack of Transportation (Non-Medical): Not on file  Physical Activity:   . Days of Exercise per Week: Not on file  . Minutes of Exercise per Session: Not on file  Stress:   . Feeling of Stress : Not on file  Social Connections:   . Frequency of Communication with Friends and Family: Not on file  . Frequency of Social Gatherings with Friends and Family: Not on file  . Attends Religious Services: Not on file  . Active Member of Clubs or Organizations: Not on file  . Attends Banker Meetings: Not on file   . Marital Status: Not on file  Intimate Partner Violence:   . Fear of Current or Ex-Partner: Not on file  . Emotionally Abused: Not on file  . Physically Abused: Not on file  . Sexually Abused: Not on file     BP 138/88   Pulse (!) 108   Ht 6\' 3"  (1.905 m)   Wt (!) 354 lb (160.6 kg)   SpO2 97%   BMI 44.25 kg/m   Physical Exam:  Well appearing morbidly obese man, NAD HEENT: Unremarkable Neck:  7 cm JVD, no thyromegally Lymphatics:  No adenopathy Back:  No CVA tenderness Lungs:  Clear with no wheezes HEART:  Regular rate rhythm, no murmurs, no rubs, no clicks Abd:  soft, positive bowel sounds, no organomegally, no rebound, no guarding Ext:  2 plus pulses, no edema, no cyanosis, no clubbing Skin:  No rashes no nodules Neuro:  CN II through XII intact, motor grossly intact  EKG - atypical atrial flutter with a RVR/RBBB  Assess/Plan: 1. Left atrial flutter - he has had persistent flutter and I have recommended he continue his amiodarone and undergo DCCV in a month. 2. Tachy induced CM - He has had this before. I have recommended he continue his current meds and expect his EF will improve as his rate improves after DCCV If he is unable to maintain NSR, then we might consider AV node ablation and PPM. 3. Obesity - this is playing a role. We will work on weight loss. 4. HTN - his bp is mildly elevated. If he will maintain NSR, might consider switchin his metoprolol to coreg.  .D.

## 2019-07-11 NOTE — Patient Instructions (Addendum)
Medication Instructions:  Your physician recommends that you continue on your current medications as directed. Please refer to the Current Medication list given to you today.  Labwork: None ordered.  Testing/Procedures: None ordered.  Follow-Up: Your physician wants you to follow-up in: March 2021 with Dr. Ladona Ridgel.   September 11, 2019 at 3:15 pm     Any Other Special Instructions Will Be Listed Below (If Applicable).  If you need a refill on your cardiac medications before your next appointment, please call your pharmacy.       CARDIOVERSION INSTRUCTIONS:  You are scheduled for a Cardioversion on August 17, 2019 with Dr. Ladona Ridgel.    1. Pre procedure testing  A. LAB WORK-First go to [facility] for your lab work August 15, 2019 -- you will need to present the lab orders I mailed to you. Enter [facility] at the [facility] entrance and proceed to the lab.  B.  COVID TEST -AFTER your blood work is done you will get your Covid test. August 15, 2019 at 9:00 am-- The  testing site is located at the Short Stay entrance of [facility]. Please use the [address] entrance to access the testing site. The traffic line for testing will form in the parking lot at the [employer] entrance. As you enter the parking lot, security will ask you why you are there. You should respond you have an appointment for COVID testing for a procedure/surgery. After you are tested please go home and self quarantine until the day of your procedure.  CARDIOVERSION 08/17/2019  You will go to [facility] at 6:30 am and proceed to Admitting.  DIET: Nothing to eat or drink after midnight except a sip of water with medications (see medication instructions below)  Medication Instructions: Do NOT take any medications the morning of your procedure  You must have a responsible person to drive you home and stay in the  waiting area during your procedure. Failure to do so could result in cancellation.  Bring your insurance cards.  *Special Note: Every effort is made to have your procedure done on time. Occasionally there are emergencies that occur at the hospital that may cause delays. Please be patient if a delay does occur.

## 2019-07-13 NOTE — Progress Notes (Signed)
Virtual Visit via Telephone Note   This visit type was conducted due to national recommendations for restrictions regarding the COVID-19 Pandemic (e.g. social distancing) in an effort to limit this patient's exposure and mitigate transmission in our community.  Due to his co-morbid illnesses, this patient is at least at moderate risk for complications without adequate follow up.  This format is felt to be most appropriate for this patient at this time.  All issues noted in this document were discussed and addressed.  A limited physical exam was performed with this format.  Please refer to the patient's chart for his consent to telehealth for [facility].   Evaluation Performed:  Follow-up visit  This visit type was conducted due to national recommendations for restrictions regarding the COVID-19 Pandemic (e.g. social distancing).  This format is felt to be most appropriate for this patient at this time.  All issues noted in this document were discussed and addressed.  No physical exam was performed (except for noted visual exam findings with Video Visits).  Please refer to the patient's chart (MyChart message for video visits and phone note for telephone visits) for the patient's consent to telehealth for [facility].  Date:  07/14/2019   ID:  Geoffrey Robinson, DOB 1961/06/10, MRN [id number]  Patient Location:  Home  Provider location:   [place]  PCP:  [name], MD  Cardiologist:  [name], MD Electrophysiologist:  None   Chief Complaint:  OSA, HTN, Afib  History of Present Illness:    Geoffrey Robinson is a 59 y.o. male who presents via audio/video conferencing for a telehealth visit today.    Geoffrey Robinson is a 59 y.o. male with a hx of severe OSA on CPAP, obesity, HTNand persistent atrial fibrillationon chronic anticoagulation. He is here today for followup and is doing well.  He denies any chest pain or pressure, SOB, DOE, PND, orthopnea,  Dizziness or  syncope. He has chronic LE edema that is controlled on diuretics.  He has not noticed any palpitations in aflutter.  He is compliant with his meds and is tolerating meds with no SE.    He is doing well with his CPAP device and thinks that he has gotten used to it.  He tolerates the mask and feels the pressure is adequate.  Since going on CPAP he feels rested in the am and has no significant daytime sleepiness.  He denies any significant mouth or nasal dryness or nasal congestion.  He does not think that he snores.    The patient does not have symptoms concerning for COVID-19 infection (fever, chills, cough, or new shortness of breath).    Prior CV studies:   The following studies were reviewed today:  PAP compliance download, labs, office notes from Dr. Ladona Ridgel and extender  Past Medical History:  Diagnosis Date  . Bradycardia 10/21/2015  . Dyslipidemia   . HTN (hypertension)   . NICM (nonischemic cardiomyopathy) (HCC)    a. LHC 2002: no CAD, EF 35-40%; b. Echo 2003: EF 60% - tachycardia mediated (AFlutter with RVR)   . Obesity   . Persistent atrial fibrillation (HCC)    CHADS2VASC score is 2.  s/p remote afib and aflutter ablations  . Severe obstructive sleep apnea    , CPAP at 14cm H2O  . Varicose veins    Past Surgical History:  Procedure Laterality Date  . CARDIAC SURGERY    . COLONOSCOPY       Current Meds  Medication Sig  . amiodarone (PACERONE) 200 MG tablet Take 1 tablet (200 mg total) by mouth 2 (two) times daily.  [name] aspirin 325 MG tablet Take 325 mg by mouth. Every other day to daily as needed  . diltiazem (CARDIZEM CD) 360 MG 24 hr capsule Take 1 capsule (360 mg total) by mouth daily.  . fenofibrate (TRICOR) 145 MG tablet Take 145 mg by mouth daily.  . furosemide (LASIX) 20 MG tablet TAKE 1 TABLET BY MOUTH DAILY AS NEEDED FOR EDEMA (TAKE AS NEEDED FOR EXCESS SWELLING WT GAIN 2-3 LB'S X 1 DAY OR 5 LB'S X 1 WEEK).  [name] lisinopril (PRINIVIL,ZESTRIL) 20 MG tablet Take 20 mg  by mouth daily.  . metoprolol tartrate (LOPRESSOR) 100 MG tablet Take 1 Tablet AM and 1.5 Tablet PM  . omeprazole (PRILOSEC) 40 MG capsule Take 40 mg by mouth daily.   Alveda Reasons 20 MG TABS tablet TAKE ONE TABLET BY MOUTH ONE TIME DAILY WITH SUPPER     Allergies:   Plavix [clopidogrel bisulfate]   Social History   Tobacco Use  . Smoking status: Never Smoker  . Smokeless tobacco: Never Used  Substance Use Topics  . Alcohol use: Yes    Alcohol/week: 3.0 standard drinks    Types: 1 Standard drinks or equivalent, 1 Cans of beer, 1 Glasses of wine per week    Comment: occassional beer and wine  . Drug use: No     Family Hx: The patient's family history includes Heart disease in his father; Hypertension in his father and mother.  ROS:   Please see the history of present illness.     All other systems reviewed and are negative.   Labs/Other Tests and Data Reviewed:    Recent Labs: 07/14/2018: Hemoglobin 14.0; Platelets 221 06/12/2019: ALT 39; BUN 16; Creatinine, Ser 0.95; Potassium 4.8; Sodium 142; TSH 1.563   Recent Lipid Panel Lab Results  Component Value Date/Time   CHOL  04/11/2009 04:51 AM    105        ATP III CLASSIFICATION:  <200     mg/dL   Desirable  200-239  mg/dL   Borderline High  >=240    mg/dL   High          TRIG 98 04/11/2009 04:51 AM   HDL 23 (L) 04/11/2009 04:51 AM   CHOLHDL 4.6 04/11/2009 04:51 AM   LDLCALC  04/11/2009 04:51 AM    62        Total Cholesterol/HDL:CHD Risk Coronary Heart Disease Risk Table                     Men   Women  1/2 Average Risk   3.4   3.3  Average Risk       5.0   4.4  2 X Average Risk   9.6   7.1  3 X Average Risk  23.4   11.0        Use the calculated Patient Ratio above and the CHD Risk Table to determine the patient's CHD Risk.        ATP III CLASSIFICATION (LDL):  <100     mg/dL   Optimal  100-129  mg/dL   Near or Above                    Optimal  130-159  mg/dL   Borderline  160-189  mg/dL   High  >190      mg/dL  Very High    Wt Readings from Last 3 Encounters:  07/14/19 (!) 349 lb (158.3 kg)  07/11/19 (!) 354 lb (160.6 kg)  06/12/19 (!) 352 lb 3.2 oz (159.8 kg)     Objective:    Vital Signs:  Ht 6\' 3"  (1.905 m)   Wt (!) 349 lb (158.3 kg)   BMI 43.62 kg/m     ASSESSMENT & PLAN:    1.  OSA - The patient is tolerating PAP therapy well without any problems. The PAP download was reviewed today and showed an AHI of 3.9/hr on 15 cm H2O with 80% compliance in using more than 4 hours nightly.  The patient has been using and benefiting from PAP use and will continue to benefit from therapy.   2.  Persistent atrial fibrillation/atrial flutter -s/p remote aflutter and afib ablations -he went back into atrial flutter back in Dec 2020 and was seen in office and Multaq stopped and started on Amio load and saw Dr. Jan 2021 a few days ago -recommended continuing Amio load and if he continues in aflutter plan DCCV in 4 weeks -continue Cardizem CD 240mg  daily, Lopressor 100mg  BID, Amio 200mg  BID  and Xarelto 20mg  daily -creatinine 0.95 in Dec 2020 -I encouraged him to stop the ASA to reduced risk of bleeding  3.  HTN -continue Cardizem CD 240mg  daily and Lopressor 100mg  BID  4.  Morbid Obesity -I have encouraged him to get into a routine exercise program and cut back on carbs and portions.    COVID-19 Education: The signs and symptoms of COVID-19 were discussed with the patient and how to seek care for testing (follow up with PCP or arrange E-visit).  The importance of social distancing was discussed today.  Patient Risk:   After full review of this patient's clinical status, I feel that they are at least moderate risk at this time.  Time:   Today, I have spent 15 minutes directly with the patient on telemedicine discussing medical problems including PAF, OSA, HTN, obesity.  We also reviewed the symptoms of COVID 19 and the ways to protect against contracting the virus with telehealth  technology.  I spent an additional 10 minutes reviewing patient's chart including PAP compliance download, office notes from Dr. and extenders and labs.  Medication Adjustments/Labs and Tests Ordered: Current medicines are reviewed at length with the patient today.  Concerns regarding medicines are outlined above.  Tests Ordered: No orders of the defined types were placed in this encounter.  Medication Changes: No orders of the defined types were placed in this encounter.   Disposition:  Follow up in 1 year(s)  Signed, , MD  07/14/2019 8:08 AM    [facility]

## 2019-07-14 ENCOUNTER — Encounter: Payer: Self-pay | Admitting: Cardiology

## 2019-07-14 ENCOUNTER — Other Ambulatory Visit: Payer: Self-pay

## 2019-07-14 ENCOUNTER — Ambulatory Visit: Payer: BC Managed Care – PPO | Admitting: Cardiology

## 2019-07-14 ENCOUNTER — Telehealth (INDEPENDENT_AMBULATORY_CARE_PROVIDER_SITE_OTHER): Payer: BC Managed Care – PPO | Admitting: Cardiology

## 2019-07-14 VITALS — Ht 75.0 in | Wt 349.0 lb

## 2019-07-14 DIAGNOSIS — G4733 Obstructive sleep apnea (adult) (pediatric): Secondary | ICD-10-CM | POA: Diagnosis not present

## 2019-07-14 DIAGNOSIS — I10 Essential (primary) hypertension: Secondary | ICD-10-CM

## 2019-07-14 DIAGNOSIS — I4819 Other persistent atrial fibrillation: Secondary | ICD-10-CM | POA: Diagnosis not present

## 2019-07-14 DIAGNOSIS — E66812 Obesity, class 2: Secondary | ICD-10-CM

## 2019-07-14 NOTE — Patient Instructions (Signed)
Medication Instructions:  Your physician recommends that you continue on your current medications as directed. Please refer to the Current Medication list given to you today.  *If you need a refill on your cardiac medications before your next appointment, please call your pharmacy*  Follow-Up: At [facility], you and your health needs are our priority.  As part of our continuing mission to provide you with exceptional heart care, we have created designated Provider Care Teams.  These Care Teams include your primary Cardiologist (physician) and Advanced Practice Providers (APPs -  Physician Assistants and Nurse Practitioners) who all work together to provide you with the care you need, when you need it.  Your next appointment:   6 month(s)  The format for your next appointment:   Either In Person or Virtual  Provider:   You may see Traci Turner, MD or one of the following Advanced Practice Providers on your designated Care Team:    Dayna Dunn, PA-C  Michele Lenze, PA-C  

## 2019-08-15 ENCOUNTER — Other Ambulatory Visit (HOSPITAL_COMMUNITY)
Admission: RE | Admit: 2019-08-15 | Discharge: 2019-08-15 | Disposition: A | Discharge status: Home / Self Care | Payer: BC Managed Care – PPO | Source: Ambulatory Visit | Attending: Internal Medicine | Admitting: Internal Medicine

## 2019-08-15 ENCOUNTER — Other Ambulatory Visit: Payer: Self-pay

## 2019-08-15 DIAGNOSIS — U071 COVID-19: Secondary | ICD-10-CM | POA: Insufficient documentation

## 2019-08-15 DIAGNOSIS — Z01812 Encounter for preprocedural laboratory examination: Secondary | ICD-10-CM | POA: Diagnosis not present

## 2019-08-15 LAB — BASIC METABOLIC PANEL
Anion gap: 9 (ref 5–15)
BUN: 23 mg/dL — ABNORMAL HIGH (ref 6–20)
CO2: 25 mmol/L (ref 22–32)
Calcium: 9 mg/dL (ref 8.9–10.3)
Chloride: 106 mmol/L (ref 98–111)
Creatinine, Ser: 0.96 mg/dL (ref 0.61–1.24)
GFR calc Af Amer: >60 mL/min (ref >60)
GFR calc non Af Amer: >60 mL/min (ref >60)
Glucose, Bld: 194 mg/dL — ABNORMAL HIGH (ref 70–99)
Potassium: 4.9 mmol/L (ref 3.5–5.1)
Sodium: 140 mmol/L (ref 135–145)

## 2019-08-16 ENCOUNTER — Telehealth: Payer: Self-pay

## 2019-08-16 LAB — SARS CORONAVIRUS 2 (TAT 6-24 HRS): SARS Coronavirus 2: POSITIVE — AB

## 2019-08-16 NOTE — Progress Notes (Signed)
Pre-procedure covid test positive.  Pt notified and advised to quarantine at home.  Pt asymptomatic.  Dr. Ladona Ridgel notified.  Cardioversion canceled for tomorrow.    Roselie Awkward, RN

## 2019-08-16 NOTE — Telephone Encounter (Signed)
-----   Message from Bryson Dames, RN sent at 08/16/2019 11:47 AM EST ----- Regarding: positive covid test Hey, Just wanted to let you know that [name] was covid positive, so his cardioversion tomorrow will be canceled.  I called and let him know the results and that I would notify the cardiology office about cancellation and to get him rescheduled.  Thanks!  Carollee Herter RN

## 2019-08-17 ENCOUNTER — Ambulatory Visit (HOSPITAL_COMMUNITY)
Admission: RE | Admit: 2019-08-17 | Payer: BC Managed Care – PPO | Source: Home / Self Care | Admitting: Internal Medicine

## 2019-08-17 ENCOUNTER — Encounter (HOSPITAL_COMMUNITY): Admission: RE | Payer: BC Managed Care – PPO | Source: Home / Self Care

## 2019-08-17 ENCOUNTER — Telehealth: Payer: Self-pay | Admitting: Internal Medicine

## 2019-08-17 SURGERY — CARDIOVERSION
Anesthesia: General

## 2019-08-17 NOTE — Telephone Encounter (Signed)
Called to discuss with patient about Covid symptoms and the use of bamlanivimab, a monoclonal antibody infusion for those with mild to moderate Covid symptoms and at high risk of hospitalization.   Pt appears to qualify for this infusion at [facility] infusion center due to co-morbid conditions and/or member of at risk group. Would need to determine if pt symptomatic and, if so, when symptoms started.   Unable to reach pt and no voicemail set up to leave message. Myself or team member will attempt to reach out again at later time.   Cyndee Brightly, NP-C [facility]

## 2019-08-18 NOTE — Telephone Encounter (Signed)
Attempted to call Pt to reschedule DCCV.  Call placed to home and cell phone.  Unable to leave a message/no answer.

## 2019-08-28 NOTE — Telephone Encounter (Signed)
Pt rescheduled for DCCV on 09/07/2019.  Pt tested positive for covid on 08/15/2019.  Pt states he never had any symptoms with virus.  Pt will NOT require another covid test.      You are scheduled for Cardioversion on September 07, 2019 with Dr. Jacques Navy.  Please arrive at the [facility] (Main Entrance A) at [facility]: [address] [place], [place] [address] at 6:00 AM.  DIET: Nothing to eat or drink after midnight except a sip of water with medications (see medication instructions below)  Medication Instructions: Will hold AM meds  You will need to continue your anticoagulant after your procedure until you  are told by your  Provider that it is safe to stop   Labs: If patient is on Coumadin, patient needs pt/INR, CBC, BMET within 3 days (No pt/INR needed for patients taking Xarelto, Eliquis, Pradaxa) For patients receiving anesthesia for TEE and all Cardioversion patients: BMET, CBC within 1 week  Come to: your lab work will be done at the hospital prior to your procedure - you will need to arrive 1  hours ahead of your procedure  You must have a responsible person to drive you home and stay in the waiting area during your procedure. Failure to do so could result in cancellation.  Bring your insurance cards.  *Special Note: Every effort is made to have your procedure done on time. Occasionally there are emergencies that occur at the hospital that may cause delays. Please be patient if a delay does occur.

## 2019-09-07 ENCOUNTER — Ambulatory Visit (HOSPITAL_COMMUNITY): Payer: BC Managed Care – PPO | Admitting: Certified Registered"

## 2019-09-07 ENCOUNTER — Encounter (HOSPITAL_COMMUNITY)
Admission: RE | Disposition: A | Discharge status: Home / Self Care | Payer: Self-pay | Source: Home / Self Care | Attending: Internal Medicine

## 2019-09-07 ENCOUNTER — Other Ambulatory Visit: Payer: Self-pay

## 2019-09-07 ENCOUNTER — Ambulatory Visit (HOSPITAL_COMMUNITY)
Admission: RE | Admit: 2019-09-07 | Discharge: 2019-09-07 | Disposition: A | Discharge status: Home / Self Care | Payer: BC Managed Care – PPO | Attending: Internal Medicine | Admitting: Internal Medicine

## 2019-09-07 ENCOUNTER — Encounter (HOSPITAL_COMMUNITY): Payer: Self-pay | Admitting: Internal Medicine

## 2019-09-07 DIAGNOSIS — I10 Essential (primary) hypertension: Secondary | ICD-10-CM | POA: Diagnosis not present

## 2019-09-07 DIAGNOSIS — I484 Atypical atrial flutter: Secondary | ICD-10-CM | POA: Diagnosis not present

## 2019-09-07 HISTORY — PX: CARDIOVERSION: SHX1299

## 2019-09-07 LAB — POCT I-STAT, CHEM 8
BUN: 33 mg/dL — ABNORMAL HIGH (ref 6–20)
Calcium, Ion: 1.13 mmol/L — ABNORMAL LOW (ref 1.15–1.40)
Chloride: 105 mmol/L (ref 98–111)
Creatinine, Ser: 1 mg/dL (ref 0.61–1.24)
Glucose, Bld: 215 mg/dL — ABNORMAL HIGH (ref 70–99)
HCT: 47 % (ref 39.0–52.0)
Hemoglobin: 16 g/dL (ref 13.0–17.0)
Potassium: 4.9 mmol/L (ref 3.5–5.1)
Sodium: 140 mmol/L (ref 135–145)
TCO2: 27 mmol/L (ref 22–32)

## 2019-09-07 SURGERY — CARDIOVERSION
Anesthesia: General

## 2019-09-07 MED ORDER — PROPOFOL 10 MG/ML IV BOLUS
INTRAVENOUS | Status: DC | PRN
  Administered 2019-09-07: 30 mg via INTRAVENOUS
  Administered 2019-09-07: 50 mg via INTRAVENOUS
  Administered 2019-09-07: 70 mg via INTRAVENOUS

## 2019-09-07 MED ORDER — SODIUM CHLORIDE 0.9 % IV SOLN
INTRAVENOUS | Status: AC | PRN
  Administered 2019-09-07: 500 mL via INTRAVENOUS

## 2019-09-07 MED ORDER — SODIUM CHLORIDE 0.9 % IV SOLN: INTRAVENOUS | Status: DC | PRN

## 2019-09-07 MED ORDER — METOPROLOL TARTRATE 100 MG PO TABS: ORAL_TABLET | ORAL | 5 refills | Status: DC

## 2019-09-07 MED ORDER — LIDOCAINE 2% (20 MG/ML) 5 ML SYRINGE
INTRAMUSCULAR | Status: DC | PRN
  Administered 2019-09-07: 60 mg via INTRAVENOUS

## 2019-09-07 NOTE — H&P (Addendum)
Patient last seen in office 07/11/2019, brief H&P pre-procedure.  Mr. Albers feels well overall and has a history of hypertension and atrial flutter. Has a history of both flutter and fib ablation.  Now with recurrence of left atrial flutter noted on electrophysiology note 07/11/2019 Dr. Ladona Ridgel.  EKG obtained this morning demonstrates atrial flutter with a rate of approximately 107-108 bpm, appears similar to EKG from 07/10/2021 1 at electrophysiology office visit reviewed and noted to have atrial flutter per office note.  EF is 35 to 40% on last echocardiogram 07/13/2019.  No prior echo is available.  Patient and I had a thorough discussion about the risks and benefits of cardioversion.  We discussed that anticoagulation is mandatory for the preceding 4 weeks.  He takes Xarelto and has no missed doses per his report.  I confirmed this multiple times.  GEN: NAD, CV: Regular rhythm, tachycardic, no murmurs, lungs: Clear bilaterally, extreme: No edema, warm.  A/P: Patient remains in atypical atrial flutter with a rate approximately 107 similar in appearance to the EKG obtained during his electrophysiology visit.  This suggests indications for cardioversion persist, and we will proceed today.  INFORMED CONSENT: Risks, benefits and alternatives of direct current cardioversion reviewed including potential for post-cardioversion rhythms, especially life-threatening arrhythmias (ventricular tachycardia and fibrillation, profound bradycardia). Major complications may include serious or fatal arrhythmias, myocardial damage, and acute pulmonary edema; minor complications include skin burns and transient hypotension. Benefits include restoration of sinus rhythm. Alternatives to treatment were discussed, questions were answered. Patient is willing to proceed.

## 2019-09-07 NOTE — Anesthesia Postprocedure Evaluation (Signed)
Anesthesia Post Note  Patient: Geoffrey Robinson  Procedure(s) Performed: CARDIOVERSION (N/A )     Patient location during evaluation: Endoscopy Anesthesia Type: General Level of consciousness: awake and alert Pain management: pain level controlled Vital Signs Assessment: post-procedure vital signs reviewed and stable Respiratory status: spontaneous breathing, nonlabored ventilation, respiratory function stable and patient connected to nasal cannula oxygen Cardiovascular status: blood pressure returned to baseline and stable Postop Assessment: no apparent nausea or vomiting Anesthetic complications: no    Last Vitals:  Vitals:   09/07/19 0840 09/07/19 0855  BP: 113/66 121/74  Pulse: 62 62  Resp: 13 17  Temp:    SpO2: 98% 99%    Last Pain:  Vitals:   09/07/19 0855  TempSrc:   PainSc: 0-No pain                 Tateanna Bach L Vedha Tercero

## 2019-09-07 NOTE — Discharge Instructions (Signed)
Electrical Cardioversion   What can I expect after the procedure?  Your blood pressure, heart rate, breathing rate, and blood oxygen level will be monitored until you leave the hospital or clinic.  Your heart rhythm will be watched to make sure it does not change.  You may have some redness on the skin where the shocks were given. Follow these instructions at home:  Do not drive for 24 hours if you were given a sedative during your procedure.  Take over-the-counter and prescription medicines only as told by your health care provider.  Ask your health care provider how to check your pulse. Check it often.  Rest for 48 hours after the procedure or as told by your health care provider.  Avoid or limit your caffeine use as told by your health care provider.  Keep all follow-up visits as told by your health care provider. This is important. Contact a health care provider if:  You feel like your heart is beating too quickly or your pulse is not regular.  You have a serious muscle cramp that does not go away. Get help right away if:  You have discomfort in your chest.  You are dizzy or you feel faint.  You have trouble breathing or you are short of breath.  Your speech is slurred.  You have trouble moving an arm or leg on one side of your body.  Your fingers or toes turn cold or blue. Summary  Electrical cardioversion is the delivery of a jolt of electricity to restore a normal rhythm to the heart.  This procedure may be done right away in an emergency or may be a scheduled procedure if the condition is not an emergency.  Generally, this is a safe procedure.  After the procedure, check your pulse often as told by your health care provider. This information is not intended to replace advice given to you by your health care provider. Make sure you discuss any questions you have with your health care provider. Document Revised: 01/09/2019 Document Reviewed:  01/09/2019 Elsevier Patient Education  2020 [employer] Inc.  

## 2019-09-07 NOTE — Transfer of Care (Signed)
Immediate Anesthesia Transfer of Care Note  Patient: Geoffrey Robinson  Procedure(s) Performed: CARDIOVERSION (N/A )  Patient Location: Endoscopy Unit  Anesthesia Type:General  Level of Consciousness: awake, alert  and oriented  Airway & Oxygen Therapy: Patient Spontanous Breathing  Post-op Assessment: Report given to RN  Post vital signs: Reviewed and stable  Last Vitals:  Vitals Value Taken Time  BP    Temp    Pulse    Resp    SpO2      Last Pain:  Vitals:   09/07/19 0651  TempSrc: Oral  PainSc: 0-No pain         Complications: No apparent anesthesia complications

## 2019-09-07 NOTE — Addendum Note (Signed)
Addendum  created 09/07/19 1154 by Elmer Picker, MD   Clinical Note Signed

## 2019-09-07 NOTE — CV Procedure (Signed)
Procedure: Electrical Cardioversion Indications:  Atrial Flutter, atypical  Procedure Details:  Consent: Risks of procedure as well as the alternatives and risks of each were explained to the (patient/caregiver).  Consent for procedure obtained.  Time Out: Verified patient identification, verified procedure, site/side was marked, verified correct patient position, special equipment/implants available, medications/allergies/relevent history reviewed, required imaging and test results available. PERFORMED.  Patient placed on cardiac monitor, pulse oximetry, supplemental oxygen as necessary.  Sedation given: propofol per anesthesia Pacer pads placed anterior and posterior chest.  Cardioverted 1 time(s).  Cardioversion with synchronized biphasic 120J shock.  Evaluation: Findings: Post procedure EKG shows: NSR Complications: None Patient did tolerate procedure well.  Time Spent Directly with the Patient:  45 minutes   Parke Poisson 09/07/2019, 8:32 AM

## 2019-09-07 NOTE — Anesthesia Preprocedure Evaluation (Addendum)
Anesthesia Evaluation  Patient identified by MRN, date of birth, ID band Patient awake    Reviewed: Allergy & Precautions, NPO status , Patient's Chart, lab work & pertinent test results, reviewed documented beta blocker date and time   Airway Mallampati: III  TM Distance: >3 FB Neck ROM: Full  Mouth opening: Limited Mouth Opening  Dental no notable dental hx. (+) Teeth Intact, Dental Advisory Given   Pulmonary sleep apnea and Continuous Positive Airway Pressure Ventilation ,    Pulmonary exam normal breath sounds clear to auscultation       Cardiovascular hypertension, Pt. on home beta blockers and Pt. on medications Normal cardiovascular exam+ dysrhythmias (on xarelto) Atrial Fibrillation  Rhythm:Irregular Rate:Normal  TTE 06/2019 1. Left ventricular ejection fraction, by visual estimation, is 35 to  40%. The left ventricle has moderately decreased function. There is mildly  increased left ventricular hypertrophy.  2. Left ventricular diastolic function could not be evaluated.  3. Moderately dilated left ventricular internal cavity size.  4. The left ventricle demonstrates global hypokinesis.  5. Global right ventricle has normal systolic function.The right  ventricular size is mildly enlarged.  6. Left atrial size was mildly dilated.  7. Right atrial size was mildly dilated.  8. The mitral valve is normal in structure. Trivial mitral valve  regurgitation. No evidence of mitral stenosis.  9. The tricuspid valve is normal in structure.  10. The aortic valve is tricuspid. Aortic valve regurgitation is trivial.  No evidence of aortic valve sclerosis or stenosis.  11. The pulmonic valve was normal in structure. Pulmonic valve  regurgitation is not visualized.  12. Aortic dilatation noted.  13. There is mild dilatation of the aortic root and of the ascending aorta  measuring 42 mm.  14. Moderate global reduction in LV  systolic function; mild LVH; moderate  LVE; mildly dilated ascending aorta; trace AI; mild biatrial enlargement;  mild RVE.   Neuro/Psych negative neurological ROS  negative psych ROS   GI/Hepatic Neg liver ROS, GERD  Medicated,  Endo/Other  Morbid obesity  Renal/GU negative Renal ROS  negative genitourinary   Musculoskeletal negative musculoskeletal ROS (+)   Abdominal   Peds  Hematology negative hematology ROS (+)   Anesthesia Other Findings   Reproductive/Obstetrics                          Anesthesia Physical Anesthesia Plan  ASA: III  Anesthesia Plan: General   Post-op Pain Management:    Induction: Intravenous  PONV Risk Score and Plan: 2 and Treatment may vary due to age or medical condition and Propofol infusion  Airway Management Planned: Natural Airway  Additional Equipment:   Intra-op Plan:   Post-operative Plan:   Informed Consent: I have reviewed the patients History and Physical, chart, labs and discussed the procedure including the risks, benefits and alternatives for the proposed anesthesia with the patient or authorized representative who has indicated his/her understanding and acceptance.     Dental advisory given  Plan Discussed with: CRNA  Anesthesia Plan Comments:         Anesthesia Quick Evaluation

## 2019-09-11 ENCOUNTER — Ambulatory Visit: Payer: BC Managed Care – PPO | Admitting: Internal Medicine

## 2019-09-24 ENCOUNTER — Other Ambulatory Visit: Payer: Self-pay | Admitting: Physician Assistant

## 2019-10-13 ENCOUNTER — Other Ambulatory Visit: Payer: Self-pay

## 2019-10-13 ENCOUNTER — Encounter: Payer: Self-pay | Admitting: Internal Medicine

## 2019-10-13 ENCOUNTER — Ambulatory Visit: Payer: BC Managed Care – PPO | Admitting: Internal Medicine

## 2019-10-13 VITALS — BP 146/76 | HR 57 | Ht 75.0 in | Wt 359.0 lb

## 2019-10-13 DIAGNOSIS — I10 Essential (primary) hypertension: Secondary | ICD-10-CM | POA: Diagnosis not present

## 2019-10-13 DIAGNOSIS — I428 Other cardiomyopathies: Secondary | ICD-10-CM | POA: Diagnosis not present

## 2019-10-13 DIAGNOSIS — I4892 Unspecified atrial flutter: Secondary | ICD-10-CM

## 2019-10-13 MED ORDER — AMIODARONE HCL 200 MG PO TABS: ORAL_TABLET | ORAL | 3 refills | Status: DC

## 2019-10-13 NOTE — Patient Instructions (Addendum)
Medication Instructions:  Your physician has recommended you make the following change in your medication:   1.  Reduce your amiodarone 200 mg---Take one tablet by mouth TWICE a DAY Monday through Friday.  ON Saturday and Sunday you will only take ONE tablet by mouth.  Labwork: None ordered.  Testing/Procedures: None ordered.  Follow-Up: Your physician wants you to follow-up in: 3 months with Dr. Ladona Ridgel.     January 16, 2020 at 9:30 am   Any Other Special Instructions Will Be Listed Below (If Applicable).  If you need a refill on your cardiac medications before your next appointment, please call your pharmacy.

## 2019-10-13 NOTE — Progress Notes (Signed)
HPI Geoffrey Robinson returns today for followup. He is a pleasant morbidly obese man with persistent atrial fib who has been placed on amiodarone and undergone DCCV over a month ago. He has been on 200 bid for 3 months. He feels better. He has not lost weight. He denies chest pain or sob.   Allergies  Allergen Reactions  . Plavix [Clopidogrel Bisulfate] Rash     Current Outpatient Medications  Medication Sig Dispense Refill  . acetaminophen (TYLENOL) 500 MG tablet Take 1,000 mg by mouth every 6 (six) hours as needed for moderate pain.    [name] diltiazem (CARDIZEM CD) 360 MG 24 hr capsule Take 1 capsule (360 mg total) by mouth daily. 90 capsule 30  . fenofibrate (TRICOR) 145 MG tablet Take 145 mg by mouth daily.    . furosemide (LASIX) 20 MG tablet TAKE 1 TABLET DAILY AS NEEDED FOR EDEMA (TAKE AS NEEDED FOR EXCESS SWELLING WT GAIN 2-3 LB'S X 1 DAY OR 5 LB'S X 1 WEEK). 90 tablet 2  . lisinopril (PRINIVIL,ZESTRIL) 20 MG tablet Take 20 mg by mouth daily.    . metoprolol tartrate (LOPRESSOR) 100 MG tablet Take 1 Tablet AM and 1 Tablet PM 75 tablet 5  . omeprazole (PRILOSEC) 40 MG capsule Take 40 mg by mouth daily.     Alveda Reasons 20 MG TABS tablet TAKE ONE TABLET BY MOUTH ONE TIME DAILY WITH SUPPER 90 tablet 1  . amiodarone (PACERONE) 200 MG tablet Take one tablet by mouth twice a day Monday through Friday.  Take one tablet by mouth on Saturday and Sunday. 180 tablet 3   No current facility-administered medications for this visit.     Past Medical History:  Diagnosis Date  . Bradycardia 10/21/2015  . Dyslipidemia   . HTN (hypertension)   . NICM (nonischemic cardiomyopathy) ([place])    a. [place] 2002: no CAD, EF 35-40%; b. Echo 2003: EF 60% - tachycardia mediated (AFlutter with RVR)   . Obesity   . Persistent atrial fibrillation (HCC)    CHADS2VASC score is 2.  s/p remote afib and aflutter ablations  . Severe obstructive sleep apnea    , CPAP at 14cm H2O  . Varicose veins     ROS:   All  systems reviewed and negative except as noted in the HPI.   Past Surgical History:  Procedure Laterality Date  . CARDIAC SURGERY    . CARDIOVERSION N/A 09/07/2019   Procedure: CARDIOVERSION;  Surgeon: Elouise Munroe, MD;  Location: [facility] ENDOSCOPY;  Service: Cardiovascular;  Laterality: N/A;  . COLONOSCOPY       Family History  Problem Relation Age of Onset  . Hypertension Mother   . Hypertension Father   . Heart disease Father      Social History   Socioeconomic History  . Marital status: Married    Spouse name: Not on file  . Number of children: Not on file  . Years of education: Not on file  . Highest education level: Not on file  Occupational History  . Not on file  Tobacco Use  . Smoking status: Never Smoker  . Smokeless tobacco: Never Used  Substance and Sexual Activity  . Alcohol use: Yes    Alcohol/week: 3.0 standard drinks    Types: 1 Standard drinks or equivalent, 1 Cans of beer, 1 Glasses of wine per week    Comment: occassional beer and wine  . Drug use: No  . Sexual activity: Not on file  Other Topics Concern  . Not on file  Social History Narrative  . Not on file   Social Determinants of Health   Financial Resource Strain:   . Difficulty of Paying Living Expenses:   Food Insecurity:   . Worried About Programme researcher, broadcasting/film/video in the Last Year:   . Barista in the Last Year:   Transportation Needs:   . Freight forwarder (Medical):   [name] Lack of Transportation (Non-Medical):   Physical Activity:   . Days of Exercise per Week:   . Minutes of Exercise per Session:   Stress:   . Feeling of Stress :   Social Connections:   . Frequency of Communication with Friends and Family:   . Frequency of Social Gatherings with Friends and Family:   . Attends Religious Services:   . Active Member of Clubs or Organizations:   . Attends Banker Meetings:   [name] Marital Status:   Intimate Partner Violence:   . Fear of Current or Ex-Partner:     . Emotionally Abused:   [name] Physically Abused:   . Sexually Abused:      BP (!) 146/76   Pulse (!) 57   Ht 6\' 3"  (1.905 m)   Wt (!) 359 lb (162.8 kg)   SpO2 96%   BMI 44.87 kg/m   Physical Exam:  Well appearing NAD HEENT: Unremarkable Neck:  No JVD, no thyromegally Lymphatics:  No adenopathy Back:  No CVA tenderness Lungs:  Clear with no wheezes HEART:  Regular rate rhythm, no murmurs, no rubs, no clicks Abd:  soft, positive bowel sounds, no organomegally, no rebound, no guarding Ext:  2 plus pulses, no edema, no cyanosis, no clubbing Skin:  No rashes no nodules Neuro:  CN II through XII intact, motor grossly intact  EKG - nsr with RBBB  Assess/Plan: 1. Persistent atrial fib - he is maintaining NSR and I have asked him to reduce his dose of amio to 200 daily on Sat/Sun and 200 bid Mon-Fri. I plan additional decreases as time goes by. 2. Obesity - he had lost over 100 lbs with the weight watchers/Atkins diet. I encouraged him to start back and increase his activity now that he is back in rhythm. 3. HTN - his sbp is up. I asked him to reduce his salt and lose weight. 4. LV dysfunction - once he has maintained NSR, we will repeat his echo to see if his EF has improved back in NSR.  .D.

## 2019-10-30 DIAGNOSIS — M170 Bilateral primary osteoarthritis of knee: Secondary | ICD-10-CM | POA: Insufficient documentation

## 2019-12-01 ENCOUNTER — Other Ambulatory Visit: Payer: Self-pay | Admitting: Cardiology

## 2019-12-01 NOTE — Telephone Encounter (Signed)
Prescription refill request for Xarelto received.   Last office visit: Geoffrey Robinson 10/13/2019 Weight: 162.8 kg Age: 59 y.o. Scr: 0.96, 08/15/2019 CrCl: 197 ml/min   Prescription refill sent.

## 2020-01-16 ENCOUNTER — Encounter: Payer: Self-pay | Admitting: Internal Medicine

## 2020-01-16 ENCOUNTER — Ambulatory Visit: Payer: BC Managed Care – PPO | Admitting: Internal Medicine

## 2020-01-16 ENCOUNTER — Other Ambulatory Visit: Payer: Self-pay

## 2020-01-16 VITALS — BP 148/80 | HR 56 | Ht 75.0 in | Wt 351.0 lb

## 2020-01-16 DIAGNOSIS — I4819 Other persistent atrial fibrillation: Secondary | ICD-10-CM | POA: Diagnosis not present

## 2020-01-16 DIAGNOSIS — I10 Essential (primary) hypertension: Secondary | ICD-10-CM

## 2020-01-16 DIAGNOSIS — I519 Heart disease, unspecified: Secondary | ICD-10-CM

## 2020-01-16 MED ORDER — AMIODARONE HCL 200 MG PO TABS: ORAL_TABLET | ORAL | 3 refills | Status: DC

## 2020-01-16 NOTE — Patient Instructions (Addendum)
Medication Instructions:  Your physician has recommended you make the following change in your medication:  1 Start taking amiodarone (200mg ) one tablet by mouth twice a day Monday - Thursday. Take one tablet by mouth once a day Friday- Sunday.  *If you need a refill on your cardiac medications before your next appointment, please call your pharmacy*  Lab Work: None ordered.  If you have labs (blood work) drawn today and your tests are completely normal, you will receive your results only by: 08-19-1975 MyChart Message (if you have MyChart) OR . A paper copy in the mail If you have any lab test that is abnormal or we need to change your treatment, we will call you to review the results.  Testing/Procedures: None ordered.  Follow-Up: At [facility], you and your health needs are our priority.  As part of our continuing mission to provide you with exceptional heart care, we have created designated Provider Care Teams.  These Care Teams include your primary Cardiologist (physician) and Advanced Practice Providers (APPs -  Physician Assistants and Nurse Practitioners) who all work together to provide you with the care you need, when you need it.  We recommend signing up for the patient portal called "MyChart".  Sign up information is provided on this After Visit Summary.  MyChart is used to connect with patients for Virtual Visits (Telemedicine).  Patients are able to view lab/test results, encounter notes, upcoming appointments, etc.  Non-urgent messages can be sent to your provider as well.   To learn more about what you can do with MyChart, go to [facility].    Your next appointment:   Your physician wants you to follow-up in: 6 months with Dr. [email or web address]. You will receive a reminder letter in the mail two months in advance. If you don't receive a letter, please call our office to schedule the follow-up appointment.    Other Instructions:

## 2020-01-16 NOTE — Progress Notes (Signed)
HPI [name] returns today for followup of his atrial fib. He is a pleasant morbidly obese 59 yo man with HTN, mixed congestive heart failure who was seen by me 3 months ago. In the interim, he has had minimal palpitations. He has lost a few more pounds.  Allergies  Allergen Reactions  . Plavix [Clopidogrel Bisulfate] Rash     Current Outpatient Medications  Medication Sig Dispense Refill  . acetaminophen (TYLENOL) 500 MG tablet Take 1,000 mg by mouth every 6 (six) hours as needed for moderate pain.    [name] amiodarone (PACERONE) 200 MG tablet Take one tablet by mouth twice a day Monday through Friday.  Take one tablet by mouth on Saturday and Sunday. 180 tablet 3  . diltiazem (CARDIZEM CD) 360 MG 24 hr capsule Take 1 capsule (360 mg total) by mouth daily. 90 capsule 30  . fenofibrate (TRICOR) 145 MG tablet Take 145 mg by mouth daily.    . furosemide (LASIX) 20 MG tablet TAKE 1 TABLET DAILY AS NEEDED FOR EDEMA (TAKE AS NEEDED FOR EXCESS SWELLING WT GAIN 2-3 LB'S X 1 DAY OR 5 LB'S X 1 WEEK). 90 tablet 2  . lisinopril (PRINIVIL,ZESTRIL) 20 MG tablet Take 20 mg by mouth daily.    . metoprolol tartrate (LOPRESSOR) 100 MG tablet Take 1 Tablet AM and 1 Tablet PM 75 tablet 5  . omeprazole (PRILOSEC) 40 MG capsule Take 40 mg by mouth daily.     Geoffrey Robinson 20 MG TABS tablet TAKE ONE TABLET BY MOUTH ONE TIME DAILY WITH SUPPER 90 tablet 1  . tamsulosin (FLOMAX) 0.4 MG CAPS capsule Take 0.4 mg by mouth at bedtime.     No current facility-administered medications for this visit.     Past Medical History:  Diagnosis Date  . Bradycardia 10/21/2015  . Dyslipidemia   . HTN (hypertension)   . NICM (nonischemic cardiomyopathy) (HCC)    a. [place] 2002: no CAD, EF 35-40%; b. Echo 2003: EF 60% - tachycardia mediated (AFlutter with RVR)   . Obesity   . Persistent atrial fibrillation (HCC)    CHADS2VASC score is 2.  s/p remote afib and aflutter ablations  . Severe obstructive sleep apnea    , CPAP at  14cm H2O  . Varicose veins     ROS:   All systems reviewed and negative except as noted in the HPI.   Past Surgical History:  Procedure Laterality Date  . CARDIAC SURGERY    . CARDIOVERSION N/A 09/07/2019   Procedure: CARDIOVERSION;  Surgeon: Parke Poisson, MD;  Location: [facility] ENDOSCOPY;  Service: Cardiovascular;  Laterality: N/A;  . COLONOSCOPY       Family History  Problem Relation Age of Onset  . Hypertension Mother   . Hypertension Father   . Heart disease Father      Social History   Socioeconomic History  . Marital status: Married    Spouse name: Not on file  . Number of children: Not on file  . Years of education: Not on file  . Highest education level: Not on file  Occupational History  . Not on file  Tobacco Use  . Smoking status: Never Smoker  . Smokeless tobacco: Never Used  Vaping Use  . Vaping Use: Never used  Substance and Sexual Activity  . Alcohol use: Yes    Alcohol/week: 3.0 standard drinks    Types: 1 Standard drinks or equivalent, 1 Cans of beer, 1 Glasses of wine per week  Comment: occassional beer and wine  . Drug use: No  . Sexual activity: Not on file  Other Topics Concern  . Not on file  Social History Narrative  . Not on file   Social Determinants of Health   Financial Resource Strain:   . Difficulty of Paying Living Expenses:   Food Insecurity:   . Worried About Programme researcher, broadcasting/film/video in the Last Year:   . Barista in the Last Year:   Transportation Needs:   . Freight forwarder (Medical):   [name] Lack of Transportation (Non-Medical):   Physical Activity:   . Days of Exercise per Week:   . Minutes of Exercise per Session:   Stress:   . Feeling of Stress :   Social Connections:   . Frequency of Communication with Friends and Family:   . Frequency of Social Gatherings with Friends and Family:   . Attends Religious Services:   . Active Member of Clubs or Organizations:   . Attends Banker Meetings:     [name] Marital Status:   Intimate Partner Violence:   . Fear of Current or Ex-Partner:   . Emotionally Abused:   [name] Physically Abused:   . Sexually Abused:      BP (!) 148/80   Pulse 56   Ht 6\' 3"  (1.905 m)   Wt (!) 351 lb (159.2 kg)   SpO2 93%   BMI 43.87 kg/m   Physical Exam:  obese appearing 59 yo man, NAD HEENT: Unremarkable Neck:  No JVD, no thyromegally Lymphatics:  No adenopathy Back:  No CVA tenderness Lungs:  Clear with no wheezes HEART:  Regular rate rhythm, no murmurs, no rubs, no clicks Abd:  soft, positive bowel sounds, no organomegally, no rebound, no guarding Ext:  2 plus pulses, no edema, no cyanosis, no clubbing Skin:  No rashes no nodules Neuro:  CN II through XII intact, motor grossly intact  EKG - sinus bradycardia  Assess/Plan: 1. Persistent atrial fib - he is maintaining NSR. He will reduce his dose of amiodarone to 200 bid for 4 days a week and 200 daily Friday/Sat/Sun. 2. Obesity - I encouraged him to lose weight.  3. HTN - his bp is up a bit. I also asked him to reduce his salt intake. 4. Chronic systolic heart failure - his symptoms are class 2. He is on good medical therapy.   61.D.

## 2020-01-17 NOTE — Addendum Note (Signed)
Addended by: Solon Augusta on: 01/17/2020 05:32 PM   Modules accepted: Orders

## 2020-01-22 ENCOUNTER — Other Ambulatory Visit (HOSPITAL_COMMUNITY): Payer: Self-pay | Admitting: Physician Assistant

## 2020-05-27 ENCOUNTER — Other Ambulatory Visit: Payer: Self-pay | Admitting: Cardiology

## 2020-05-27 NOTE — Telephone Encounter (Signed)
Prescription refill request for Xarelto received.  Indication:Afib Last office visit: Ladona Ridgel 01/16/2020 Weight: 159.2 kg Age: 59 yo Scr: 0.96 08/15/2019 CrCl: 193 ml/min   Prescription refill sent

## 2020-05-30 ENCOUNTER — Ambulatory Visit: Payer: BC Managed Care – PPO | Attending: Internal Medicine

## 2020-05-30 DIAGNOSIS — Z23 Encounter for immunization: Secondary | ICD-10-CM

## 2020-05-30 NOTE — Progress Notes (Signed)
   Covid-19 Vaccination Clinic  Name:  ANDREZ LIEURANCE    MRN: [id number] DOB: 08/23/1960  05/30/2020  Mr. Brisbane was observed post Covid-19 immunization for 15 minutes without incident. He was provided with Vaccine Information Sheet and instruction to access the V-Safe system.   Mr. Broyhill was instructed to call 911 with any severe reactions post vaccine: [name] Difficulty breathing  . Swelling of face and throat  . A fast heartbeat  . A bad rash all over body  . Dizziness and weakness   Immunizations Administered    Name Date Dose VIS Date Route   JANSSEN COVID-19 VACCINE 05/30/2020  3:33 PM 0.5 mL 04/10/2020 Intramuscular   Manufacturer: [employer]   Lot: [id number]   NDC: [phone]

## 2020-06-14 ENCOUNTER — Other Ambulatory Visit: Payer: Self-pay

## 2020-06-14 ENCOUNTER — Emergency Department (HOSPITAL_BASED_OUTPATIENT_CLINIC_OR_DEPARTMENT_OTHER)
Admission: EM | Admit: 2020-06-14 | Discharge: 2020-06-14 | Disposition: A | Discharge status: Home / Self Care | Payer: BC Managed Care – PPO | Attending: Emergency Medicine | Admitting: Emergency Medicine

## 2020-06-14 ENCOUNTER — Encounter (HOSPITAL_BASED_OUTPATIENT_CLINIC_OR_DEPARTMENT_OTHER): Payer: Self-pay

## 2020-06-14 ENCOUNTER — Emergency Department (HOSPITAL_BASED_OUTPATIENT_CLINIC_OR_DEPARTMENT_OTHER): Payer: BC Managed Care – PPO

## 2020-06-14 DIAGNOSIS — I4892 Unspecified atrial flutter: Secondary | ICD-10-CM | POA: Insufficient documentation

## 2020-06-14 DIAGNOSIS — I10 Essential (primary) hypertension: Secondary | ICD-10-CM | POA: Insufficient documentation

## 2020-06-14 DIAGNOSIS — N452 Orchitis: Secondary | ICD-10-CM | POA: Diagnosis not present

## 2020-06-14 DIAGNOSIS — I4891 Unspecified atrial fibrillation: Secondary | ICD-10-CM | POA: Insufficient documentation

## 2020-06-14 DIAGNOSIS — Z79899 Other long term (current) drug therapy: Secondary | ICD-10-CM | POA: Insufficient documentation

## 2020-06-14 DIAGNOSIS — Z7901 Long term (current) use of anticoagulants: Secondary | ICD-10-CM | POA: Insufficient documentation

## 2020-06-14 DIAGNOSIS — R1909 Other intra-abdominal and pelvic swelling, mass and lump: Secondary | ICD-10-CM | POA: Diagnosis present

## 2020-06-14 LAB — URINALYSIS, ROUTINE W REFLEX MICROSCOPIC
Bilirubin Urine: NEGATIVE
Glucose, UA: NEGATIVE mg/dL
Ketones, ur: NEGATIVE mg/dL
Nitrite: POSITIVE — AB
Protein, ur: NEGATIVE mg/dL
Specific Gravity, Urine: 1.02 (ref 1.005–1.030)
pH: 6.5 (ref 5.0–8.0)

## 2020-06-14 LAB — URINALYSIS, MICROSCOPIC (REFLEX)
RBC / HPF: >50 RBC/hpf (ref 0–5)
WBC, UA: >50 WBC/hpf (ref 0–5)

## 2020-06-14 MED ORDER — LEVOFLOXACIN 500 MG PO TABS: 500.0000 mg | ORAL_TABLET | Freq: Every day | ORAL | 0 refills | Status: DC

## 2020-06-14 NOTE — ED Triage Notes (Signed)
Pt arrives with c/o waking up with swelling to his left testicle was sent to ED from [facility] for [place].

## 2020-06-14 NOTE — Discharge Instructions (Addendum)
DO NOT take your AMIODARONE over the next 10 days while you are on the Levaquin.  This can cause a significant heart reaction that could be fatal.

## 2020-06-14 NOTE — ED Provider Notes (Signed)
[facility] EMERGENCY DEPARTMENT Provider Note   CSN: [id number] Arrival date & time: 06/14/20  1246     History Chief Complaint  Patient presents with  . Groin Swelling    Geoffrey Robinson is a 59 y.o. male.  HPI 59 year old male presents with left testicular pain and swelling.  Started in the middle the night. Pain is moderate. Bothersome at times. No fevers, vomiting, or abdominal pain. Has noticed a little blood in his urine since yesterday. No dysuria. Went to urgent care where they told him he had an infection but also needed an ultrasound to rule out torsion. No anal intercourse. Has sex with one partner, no concern for STI.   Past Medical History:  Diagnosis Date  . Bradycardia 10/21/2015  . Dyslipidemia   . HTN (hypertension)   . NICM (nonischemic cardiomyopathy) (HCC)    a. [place] 2002: no CAD, EF 35-40%; b. Echo 2003: EF 60% - tachycardia mediated (AFlutter with RVR)   . Obesity   . Persistent atrial fibrillation (HCC)    CHADS2VASC score is 2.  s/p remote afib and aflutter ablations  . Severe obstructive sleep apnea    , CPAP at 14cm H2O  . Varicose veins     Patient Active Problem List   Diagnosis Date Noted  . Nonischemic cardiomyopathy (HCC) 10/13/2019  . Atrial flutter (HCC) 06/12/2019  . Acquired thrombophilia (HCC) 06/12/2019  . Bradycardia 10/21/2015  . Varicose veins of leg with complications 03/11/2015  . Varicose veins of lower extremities with complications 11/20/2014  . Persistent atrial fibrillation (HCC)   . HTN (hypertension)   . Warfarin anticoagulation   . Dyslipidemia   . Severe obstructive sleep apnea   . Obesity     Past Surgical History:  Procedure Laterality Date  . CARDIAC SURGERY    . CARDIOVERSION N/A 09/07/2019   Procedure: CARDIOVERSION;  Surgeon: Parke Poisson, MD;  Location: [facility] ENDOSCOPY;  Service: Cardiovascular;  Laterality: N/A;  . COLONOSCOPY         Family History  Problem Relation Age of Onset  .  Hypertension Mother   . Hypertension Father   . Heart disease Father     Social History   Tobacco Use  . Smoking status: Never Smoker  . Smokeless tobacco: Never Used  Vaping Use  . Vaping Use: Never used  Substance Use Topics  . Alcohol use: Yes    Alcohol/week: 3.0 standard drinks    Types: 1 Standard drinks or equivalent, 1 Cans of beer, 1 Glasses of wine per week    Comment: occassional beer and wine  . Drug use: No    Home Medications Prior to Admission medications   Medication Sig Start Date End Date Taking? Authorizing Provider  acetaminophen (TYLENOL) 500 MG tablet Take 1,000 mg by mouth every 6 (six) hours as needed for moderate pain.    [provider]  amiodarone (PACERONE) 200 MG tablet Take one tablet by mouth twice a day Monday - Thursday. Take one tablet by mouth once a day Friday- Sunday 01/16/20   Marinus Maw, MD  diltiazem (CARDIZEM CD) 360 MG 24 hr capsule Take 1 capsule (360 mg total) by mouth daily. 06/02/19   Bhagat, Sharrell Ku, PA  fenofibrate (TRICOR) 145 MG tablet Take 145 mg by mouth daily.    [provider]  furosemide (LASIX) 20 MG tablet TAKE 1 TABLET DAILY AS NEEDED FOR EDEMA (TAKE AS NEEDED FOR EXCESS SWELLING WT GAIN 2-3 LB'S X 1 DAY  OR 5 LB'S X 1 WEEK). 09/25/19   Bhagat, Sharrell Ku, PA  lisinopril (PRINIVIL,ZESTRIL) 20 MG tablet Take 20 mg by mouth daily.    [provider]  metoprolol tartrate (LOPRESSOR) 100 MG tablet TAKE 1 TABLET BY MOUTH EVEYR MORNING AND 1 AND 1/2 TABLETS EVERY EVENING. Pt needs to make appt with provider for further refills. Thanks you 01/23/20   [name], MD  omeprazole (PRILOSEC) 40 MG capsule Take 40 mg by mouth daily.     [provider]  tamsulosin (FLOMAX) 0.4 MG CAPS capsule Take 0.4 mg by mouth at bedtime. 12/14/19   [provider]  XARELTO 20 MG TABS tablet TAKE ONE TABLET BY MOUTH ONE TIME DAILY WITH SUPPER 05/27/20   [name], MD    Allergies    Plavix  [clopidogrel bisulfate]  Review of Systems   Review of Systems  Constitutional: Negative for fever.  Gastrointestinal: Negative for abdominal pain and vomiting.  Genitourinary: Positive for scrotal swelling and testicular pain. Negative for dysuria and penile discharge.  All other systems reviewed and are negative.   Physical Exam Updated Vital Signs BP (!) 158/78 (BP Location: Left Arm)   Pulse 64   Temp 98.5 F (36.9 C) (Oral)   Resp 20   Ht 6\' 3"  (1.905 m)   Wt (!) 158.8 kg   SpO2 98%   BMI 43.75 kg/m   Physical Exam Vitals and nursing note reviewed.  Constitutional:      Appearance: He is well-developed and well-nourished.  HENT:     Head: Normocephalic and atraumatic.     Right Ear: External ear normal.     Left Ear: External ear normal.     Nose: Nose normal.  Eyes:     General:        Right eye: No discharge.        Left eye: No discharge.  Pulmonary:     Effort: Pulmonary effort is normal.  Abdominal:     General: There is no distension.     Palpations: Abdomen is soft.     Tenderness: There is no abdominal tenderness.  Genitourinary:    Comments: Left scrotum is firm and mildly warm. No significant tenderness No penile discharge. Musculoskeletal:        General: No edema.     Cervical back: Neck supple.  Skin:    General: Skin is warm and dry.  Neurological:     Mental Status: He is alert.  Psychiatric:        Mood and Affect: Mood is not anxious.     ED Results / Procedures / Treatments   Labs (all labs ordered are listed, but only abnormal results are displayed) Labs Reviewed  URINE CULTURE  URINALYSIS, ROUTINE W REFLEX MICROSCOPIC  GC/CHLAMYDIA PROBE AMP ([place]) NOT AT [facility]    EKG None  Radiology [facility] SCROTUM W/DOPPLER  Result Date: 06/14/2020 CLINICAL DATA:  Left testicular swelling EXAM: SCROTAL ULTRASOUND DOPPLER ULTRASOUND OF THE TESTICLES TECHNIQUE: Complete ultrasound examination of the testicles, epididymis, and other  scrotal structures was performed. Color and spectral Doppler ultrasound were also utilized to evaluate blood flow to the testicles. COMPARISON:  None. FINDINGS: Right testicle Measurements: 3.4 x 1.8 x 2.4 cm. No mass or microlithiasis visualized. Left testicle Measurements: 3.0 x 2.1 x 2.7 cm. No mass or microlithiasis visualized. Relative hypervascularity within the left testis compared to the right. Right epididymis:  Normal in size and appearance. Left epididymis: Enlarged, heterogeneous and hypervascular  in appearance. Hydrocele:  None visualized. Varicocele:  Small bilateral. Pulsed Doppler interrogation of both testes demonstrates normal low resistance arterial and venous waveforms bilaterally. IMPRESSION: 1. Negative for testicular torsion or intratesticular mass. 2. Findings of acute left epididymo-orchitis. 3. Small bilateral hydroceles, likely reactive. Electronically Signed   By: Duanne Guess D.O.   On: 06/14/2020 13:54    Procedures Procedures (including critical care time)  Medications Ordered in ED Medications - No data to display  ED Course  I have reviewed the triage vital signs and the nursing notes.  Pertinent labs & imaging results that were available during my care of the patient were reviewed by me and considered in my medical decision making (see chart for details).    MDM Rules/Calculators/A&P                          Discussed with pharmacy, who recommends ECG because he is on amiodarone. Unfortunately levaquin 500 mg x 10 days is best option in orchitis for him.  Since QTC is over 450 (though near baseline when compared to other ECGs), will have him stop his amiodarone during the duration of Levaquin at a lower dose of 500 mg x 10 days.  Otherwise, there is no torsion on the ultrasound.  We will send urine for urine culture as well as for GC/chlamydia though this seems less likely.  Discussed return precautions and stressed the importance of stopping the amiodarone.  Other typical option would be bactrim, but this can interact with amiodarone in addition to his lisinopril.  Final Clinical Impression(s) / ED Diagnoses Final diagnoses:  Orchitis, left    Rx / DC Orders ED Discharge Orders    None       Pricilla Loveless, MD 06/14/20 [phone]

## 2020-06-16 LAB — URINE CULTURE: Culture: >=100000 — AB

## 2020-06-17 ENCOUNTER — Telehealth: Payer: Self-pay | Admitting: Internal Medicine

## 2020-06-17 LAB — GC/CHLAMYDIA PROBE AMP (~~LOC~~) NOT AT ARMC
Chlamydia: NEGATIVE
Comment: NEGATIVE
Comment: NORMAL
Neisseria Gonorrhea: NEGATIVE

## 2020-06-17 NOTE — Telephone Encounter (Signed)
Ok to hold amiodarone until his course of anti-biotics is complete. GT

## 2020-06-17 NOTE — Telephone Encounter (Signed)
Need to address by cardiologist. See note from ED physician.

## 2020-06-17 NOTE — Telephone Encounter (Signed)
     Pt c/o medication issue:  1. Name of Medication: Levofloxacin 500mg    2. How are you currently taking this medication (dosage and times per day)? 1 tablet a day  3. Are you having a reaction (difficulty breathing--STAT)?   4. What is your medication issue? Pt said he was is the ED this weekend and prescribed this medication and was advised to stop taking his amiodarone. He wanted to know if its safe for him to do that

## 2020-06-18 NOTE — Telephone Encounter (Signed)
Call placed to Pt.  Spoke with Pt's mother.  Advised ok to hold amio until he completes his antibiotics.

## 2020-07-30 ENCOUNTER — Other Ambulatory Visit (HOSPITAL_COMMUNITY): Payer: Self-pay | Admitting: Cardiology

## 2020-08-08 ENCOUNTER — Ambulatory Visit: Payer: BC Managed Care – PPO | Admitting: Internal Medicine

## 2020-08-18 ENCOUNTER — Other Ambulatory Visit: Payer: Self-pay | Admitting: Physician Assistant

## 2020-09-07 ENCOUNTER — Other Ambulatory Visit: Payer: Self-pay | Admitting: Internal Medicine

## 2020-09-17 ENCOUNTER — Other Ambulatory Visit: Payer: Self-pay | Admitting: Cardiology

## 2020-10-03 ENCOUNTER — Ambulatory Visit: Payer: BC Managed Care – PPO | Admitting: Internal Medicine

## 2020-10-03 ENCOUNTER — Encounter: Payer: Self-pay | Admitting: Internal Medicine

## 2020-10-03 ENCOUNTER — Other Ambulatory Visit: Payer: Self-pay

## 2020-10-03 VITALS — BP 164/82 | HR 57 | Ht 75.0 in | Wt 357.0 lb

## 2020-10-03 DIAGNOSIS — I10 Essential (primary) hypertension: Secondary | ICD-10-CM

## 2020-10-03 DIAGNOSIS — I4819 Other persistent atrial fibrillation: Secondary | ICD-10-CM

## 2020-10-03 MED ORDER — AMIODARONE HCL 200 MG PO TABS: ORAL_TABLET | ORAL | 3 refills | Status: DC

## 2020-10-03 NOTE — Progress Notes (Signed)
HPI Geoffrey Robinson returns today for followup of his atrial fib. He is a pleasant morbidly obese 60 yo man with HTN, mixed congestive heart failure who was seen by me 9 months ago. In the interim, he has done well but has continued to gain weight. He denies chest pain or sob. He notes that his bp is still elevated though better at home.  Allergies  Allergen Reactions  . Plavix [Clopidogrel Bisulfate] Rash     Current Outpatient Medications  Medication Sig Dispense Refill  . acetaminophen (TYLENOL) 500 MG tablet Take 1,000 mg by mouth every 6 (six) hours as needed for moderate pain.    [name] amiodarone (PACERONE) 200 MG tablet TAKE ONE TABLET TWICE A DAY MONDAY - THURSDAY. TAKE ONE TABLET ONCE A DAY FRIDAY- SUNDAY 90 tablet 3  . diltiazem (CARDIZEM CD) 360 MG 24 hr capsule Take 1 capsule (360 mg total) by mouth daily. PLEASE KEEP UPCOMING APPT FOR FUTURE REFILLS 30 capsule 0  . fenofibrate (TRICOR) 145 MG tablet Take 145 mg by mouth daily.    . furosemide (LASIX) 20 MG tablet TAKE 1 TABLET DAILY AS NEEDED FOR EDEMA (TAKE AS NEEDED FOR EXCESS SWELLING WT GAIN 2-3 LB'S X 1 DAY OR 5 LB'S X 1 WEEK). 90 tablet 2  . levofloxacin (LEVAQUIN) 500 MG tablet Take 1 tablet (500 mg total) by mouth daily. 10 tablet 0  . lisinopril (PRINIVIL,ZESTRIL) 20 MG tablet Take 20 mg by mouth daily.    . metoprolol tartrate (LOPRESSOR) 100 MG tablet TAKE 1 TABLET EVERY MORNING AND 1 AND 1/2 TABLETS EVERY EVENING. PT NEEDS TO MAKE APPT FOR REFILLS 225 tablet 1  . omeprazole (PRILOSEC) 40 MG capsule Take 40 mg by mouth daily.     . tamsulosin (FLOMAX) 0.4 MG CAPS capsule Take 0.4 mg by mouth at bedtime.    [name] 20 MG TABS tablet TAKE ONE TABLET BY MOUTH ONE TIME DAILY WITH SUPPER 90 tablet 1   No current facility-administered medications for this visit.     Past Medical History:  Diagnosis Date  . Bradycardia 10/21/2015  . Dyslipidemia   . HTN (hypertension)   . NICM (nonischemic cardiomyopathy) (HCC)    a.  LHC 2002: no CAD, EF 35-40%; b. Echo 2003: EF 60% - tachycardia mediated (AFlutter with RVR)   . Obesity   . Persistent atrial fibrillation (HCC)    CHADS2VASC score is 2.  s/p remote afib and aflutter ablations  . Severe obstructive sleep apnea    , CPAP at 14cm H2O  . Varicose veins     ROS:   All systems reviewed and negative except as noted in the HPI.   Past Surgical History:  Procedure Laterality Date  . CARDIAC SURGERY    . CARDIOVERSION N/A 09/07/2019   Procedure: CARDIOVERSION;  Surgeon: Parke Poisson, MD;  Location: [facility] ENDOSCOPY;  Service: Cardiovascular;  Laterality: N/A;  . COLONOSCOPY       Family History  Problem Relation Age of Onset  . Hypertension Mother   . Hypertension Father   . Heart disease Father      Social History   Socioeconomic History  . Marital status: Married    Spouse name: Not on file  . Number of children: Not on file  . Years of education: Not on file  . Highest education level: Not on file  Occupational History  . Not on file  Tobacco Use  . Smoking status: Never Smoker  .  Smokeless tobacco: Never Used  Vaping Use  . Vaping Use: Never used  Substance and Sexual Activity  . Alcohol use: Yes    Alcohol/week: 3.0 standard drinks    Types: 1 Standard drinks or equivalent, 1 Cans of beer, 1 Glasses of wine per week    Comment: occassional beer and wine  . Drug use: No  . Sexual activity: Not on file  Other Topics Concern  . Not on file  Social History Narrative  . Not on file   Social Determinants of Health   Financial Resource Strain: Not on file  Food Insecurity: Not on file  Transportation Needs: Not on file  Physical Activity: Not on file  Stress: Not on file  Social Connections: Not on file  Intimate Partner Violence: Not on file     BP (!) 164/82   Pulse (!) 57   Ht 6\' 3"  (1.905 m)   Wt (!) 357 lb (161.9 kg)   SpO2 96%   BMI 44.62 kg/m   Physical Exam:  Morbidly obese appearing NAD HEENT:  Unremarkable Neck:  No JVD, no thyromegally Lymphatics:  No adenopathy Back:  No CVA tenderness Lungs:  Clear with no wheezes HEART:  Regular rate rhythm, no murmurs, no rubs, no clicks Abd:  soft, positive bowel sounds, no organomegally, no rebound, no guarding Ext:  2 plus pulses, no edema, no cyanosis, no clubbing Skin:  No rashes no nodules Neuro:  CN II through XII intact, motor grossly intact  EKG - sinus brady with rbbb  Assess/Plan: 1. Persistent atrial fib - he is maintaining NSR. He will reduce his dose of amiodarone to 200 bid for 2 days a week and 200 daily Mon-Fri. 2. Obesity - I encouraged him to lose weight. We discussed intermittent fasting 3. HTN - his bp is up a bit. I also asked him to reduce his salt intake and to lose weight 4. Chronic systolic heart failure - his symptoms are class 2. He is on good medical therapy.   Shanterria Franta,MD

## 2020-10-03 NOTE — Patient Instructions (Addendum)
Medication Instructions:  Your physician has recommended you make the following change in your medication:   1.  CHANGE your amiodarone 200 mg-  Take one tablet by mouth daily Monday through Friday.  Take 2 tablets by mouth Saturday and Sunday.  Labwork: None ordered.  Testing/Procedures: None ordered.  Follow-Up: Your physician wants you to follow-up in: one year with Lewayne Bunting, MD or one of the following Advanced Practice Providers on your designated Care Team:    Gypsy Balsam, NP  Francis Dowse, PA-C  Casimiro Needle "Mardelle Matte" [place], [place]   Any Other Special Instructions Will Be Listed Below (If Applicable).  If you need a refill on your cardiac medications before your next appointment, please call your pharmacy.

## 2020-10-13 ENCOUNTER — Other Ambulatory Visit: Payer: Self-pay | Admitting: Physician Assistant

## 2020-10-16 ENCOUNTER — Other Ambulatory Visit: Payer: Self-pay | Admitting: Cardiology

## 2020-10-30 ENCOUNTER — Telehealth: Payer: Self-pay | Admitting: Cardiology

## 2020-10-30 NOTE — Telephone Encounter (Signed)
Called pt back and there was no answer and no voice mail setup to inform pt that his medication was sent to his pharmacy on 10/16/20 asking pt to make an overdue appt with Dr. Mayford Knife, 3rd and Final Attempt, and pt has not done so.

## 2020-10-30 NOTE — Telephone Encounter (Signed)
*  STAT* If patient is at the pharmacy, call can be transferred to refill team.   1. Which medications need to be refilled? (please list name of each medication and dose if known)? diltiazem (CARDIZEM CD) 360 MG 24 hr capsule  2. Which pharmacy/location (including street and city if local pharmacy) is medication to be sent to? [id number] - [place], [place] - [address]  3. Do they need a 30 day or 90 day supply? 90

## 2020-10-31 ENCOUNTER — Telehealth: Payer: Self-pay | Admitting: Internal Medicine

## 2020-10-31 MED ORDER — FUROSEMIDE 20 MG PO TABS: ORAL_TABLET | ORAL | 1 refills | Status: DC

## 2020-10-31 MED ORDER — DILTIAZEM HCL ER COATED BEADS 360 MG PO CP24: 360.0000 mg | ORAL_CAPSULE | Freq: Every day | ORAL | 1 refills | Status: DC

## 2020-10-31 NOTE — Telephone Encounter (Signed)
Pt's medications were sent to pt's pharmacy as requested. Confirmation received.  

## 2020-10-31 NOTE — Telephone Encounter (Signed)
*  STAT* If patient is at the pharmacy, call can be transferred to refill team.   1. Which medications need to be refilled? (please list name of each medication and dose if known) diltiazem, furosemide,   2. Which pharmacy/location (including street and city if local pharmacy) [facility]   3. Do they need a 30 day or 90 day supply? 90 patient last seen Ladona Ridgel in 04/22

## 2020-11-05 ENCOUNTER — Other Ambulatory Visit: Payer: Self-pay | Admitting: Physician Assistant

## 2020-11-22 ENCOUNTER — Other Ambulatory Visit: Payer: Self-pay

## 2020-11-22 ENCOUNTER — Ambulatory Visit: Payer: BC Managed Care – PPO | Admitting: Cardiology

## 2020-11-22 ENCOUNTER — Encounter: Payer: Self-pay | Admitting: Cardiology

## 2020-11-22 VITALS — BP 160/88 | HR 53 | Ht 75.0 in | Wt 353.0 lb

## 2020-11-22 DIAGNOSIS — I4819 Other persistent atrial fibrillation: Secondary | ICD-10-CM | POA: Diagnosis not present

## 2020-11-22 DIAGNOSIS — G4733 Obstructive sleep apnea (adult) (pediatric): Secondary | ICD-10-CM | POA: Diagnosis not present

## 2020-11-22 DIAGNOSIS — I10 Essential (primary) hypertension: Secondary | ICD-10-CM | POA: Diagnosis not present

## 2020-11-22 MED ORDER — LISINOPRIL 40 MG PO TABS: 40.0000 mg | ORAL_TABLET | Freq: Every day | ORAL | 3 refills | Status: DC

## 2020-11-22 NOTE — Addendum Note (Signed)
Addended by: Theresia Majors on: 11/22/2020 08:54 AM   Modules accepted: Orders

## 2020-11-22 NOTE — Patient Instructions (Signed)
PLEASE CHECK YOUR BLOOD PRESSURE DAILY FOR ONE WEEK AND CALL us WITH A LIST OF YOUR READINGS  Medication Instructions:  Your physician has recommended you make the following change in your medication:  1) INCREASE lisinopril to 40 mg daily  *If you need a refill on your cardiac medications before your next appointment, please call your pharmacy*   Lab Work: BMET and CBC in one week  If you have labs (blood work) drawn today and your tests are completely normal, you will receive your results only by: [name] MyChart Message (if you have MyChart) OR . A paper copy in the mail If you have any lab test that is abnormal or we need to change your treatment, we will call you to review the results.   Testing/Procedures: Your physician has requested that you have an echocardiogram. Echocardiography is a painless test that uses sound waves to create images of your heart. It provides your doctor with information about the size and shape of your heart and how well your heart's chambers and valves are working. This procedure takes approximately one hour. There are no restrictions for this procedure.   Follow-Up: At [facility], you and your health needs are our priority.  As part of our continuing mission to provide you with exceptional heart care, we have created designated Provider Care Teams.  These Care Teams include your primary Cardiologist (physician) and Advanced Practice Providers (APPs -  Physician Assistants and Nurse Practitioners) who all work together to provide you with the care you need, when you need it.  Your next appointment:   6 month(s)  The format for your next appointment:   In Person  Provider:   You may see [name], MD or one of the following Advanced Practice Providers on your designated Care Team:    Ronie Spies, PA-C  Jacolyn Reedy, PA-C

## 2020-11-22 NOTE — Progress Notes (Signed)
Date:  11/22/2020   ID:  Geoffrey Robinson, DOB 07/07/1960, MRN [id number]   PCP:  [name], MD  Cardiologist:  [name], MD Electrophysiologist:  None   Chief Complaint:  OSA, HTN, Afib  History of Present Illness:    Geoffrey Robinson is a 60 y.o. male with a hx of severe OSA on CPAP, obesity, HTNand persistent atrial fibrillationon chronic anticoagulation. He is here today for followup and is doing well.  He denies any chest pain or pressure, SOB, DOE, PND, orthopnea, LE edema, dizziness, palpitations or syncope. He is compliant with his meds and is tolerating meds with no SE.    He is doing well with his CPAP device and thinks that he has gotten used to it.  He tolerates the mask and feels the pressure is adequate.  Since going on CPAP He feels rested in the am and has no significant daytime sleepiness.  He denies any significant mouth or nasal dryness or nasal congestion.  He does not think that he snores.     Prior CV studies:   The following studies were reviewed today:  PAP compliance download, labs from PCP  Past Medical History:  Diagnosis Date  . Bradycardia 10/21/2015  . Dyslipidemia   . HTN (hypertension)   . NICM (nonischemic cardiomyopathy) (HCC)    a. LHC 2002: no CAD, EF 35-40%; b. Echo 2003: EF 60% - tachycardia mediated (AFlutter with RVR)   . Obesity   . Persistent atrial fibrillation (HCC)    CHADS2VASC score is 2.  s/p remote afib and aflutter ablations  . Severe obstructive sleep apnea    , CPAP at 14cm H2O  . Varicose veins    Past Surgical History:  Procedure Laterality Date  . CARDIAC SURGERY    . CARDIOVERSION N/A 09/07/2019   Procedure: CARDIOVERSION;  Surgeon: Parke Poisson, MD;  Location: [facility] ENDOSCOPY;  Service: Cardiovascular;  Laterality: N/A;  . COLONOSCOPY       Current Meds  Medication Sig  . acetaminophen (TYLENOL) 500 MG tablet Take 1,000 mg by mouth every 6 (six) hours as needed for moderate pain.  [name] amiodarone  (PACERONE) 200 MG tablet Take one tablet by mouth daily Monday through Friday.  Take 2 tablets by mouth Saturday and Sunday.  . diltiazem (CARDIZEM CD) 360 MG 24 hr capsule Take 1 capsule (360 mg total) by mouth daily. Please keep upcoming appt with Dr. Mayford Knife in June 2022 before anymore refills. Thank you  . fenofibrate (TRICOR) 145 MG tablet Take 145 mg by mouth daily.  . furosemide (LASIX) 20 MG tablet TAKE 1 TABLET DAILY AS NEEDED FOR EXCESS SWELLING WT GAIN 2-3 LB'S X 1 DAY OR 5 LB'S X 1 WEEK. Please keep upcoming appt in June 2022 with Dr. Mayford Knife before anymore refills. Thank you  . ketoconazole (NIZORAL) 2 % cream Apply 1 application topically daily.  [name] lisinopril (PRINIVIL,ZESTRIL) 20 MG tablet Take 20 mg by mouth daily.  . meloxicam (MOBIC) 15 MG tablet Take 15 mg by mouth daily.  . metoprolol tartrate (LOPRESSOR) 100 MG tablet Take 100 mg by mouth 2 (two) times daily.  [name] omeprazole (PRILOSEC) 40 MG capsule Take 40 mg by mouth daily.   . tamsulosin (FLOMAX) 0.4 MG CAPS capsule Take 0.4 mg by mouth at bedtime.  [name] 20 MG TABS tablet TAKE ONE TABLET BY MOUTH ONE TIME DAILY WITH SUPPER     Allergies:   Plavix [clopidogrel bisulfate]   Social History  Tobacco Use  . Smoking status: Never Smoker  . Smokeless tobacco: Never Used  Vaping Use  . Vaping Use: Never used  Substance Use Topics  . Alcohol use: Yes    Alcohol/week: 3.0 standard drinks    Types: 1 Standard drinks or equivalent, 1 Cans of beer, 1 Glasses of wine per week    Comment: occassional beer and wine  . Drug use: No     Family Hx: The patient's family history includes Heart disease in his father; Hypertension in his father and mother.  ROS:   Please see the history of present illness.     All other systems reviewed and are negative.   Labs/Other Tests and Data Reviewed:    Recent Labs: No results found for requested labs within last 8760 hours.   Recent Lipid Panel Lab Results  Component Value  Date/Time   CHOL  04/11/2009 04:51 AM    105        ATP III CLASSIFICATION:  <200     mg/dL   Desirable  656-812  mg/dL   Borderline High  >=751    mg/dL   High          TRIG 98 04/11/2009 04:51 AM   HDL 23 (L) 04/11/2009 04:51 AM   CHOLHDL 4.6 04/11/2009 04:51 AM   LDLCALC  04/11/2009 04:51 AM    62        Total Cholesterol/HDL:CHD Risk Coronary Heart Disease Risk Table                     Men   Women  1/2 Average Risk   3.4   3.3  Average Risk       5.0   4.4  2 X Average Risk   9.6   7.1  3 X Average Risk  23.4   11.0        Use the calculated Patient Ratio above and the CHD Risk Table to determine the patient's CHD Risk.        ATP III CLASSIFICATION (LDL):  <100     mg/dL   Optimal  700-174  mg/dL   Near or Above                    Optimal  130-159  mg/dL   Borderline  944-967  mg/dL   High  >591     mg/dL   Very High    Wt Readings from Last 3 Encounters:  11/22/20 (!) 353 lb (160.1 kg)  10/03/20 (!) 357 lb (161.9 kg)  06/14/20 (!) 350 lb (158.8 kg)     Objective:    Vital Signs:  BP (!) 160/88   Pulse (!) 53   Ht 6\' 3"  (1.905 m)   Wt (!) 353 lb (160.1 kg)   SpO2 95%   BMI 44.12 kg/m   GEN: Well nourished, well developed in no acute distress HEENT: Normal NECK: No JVD; No carotid bruits LYMPHATICS: No lymphadenopathy CARDIAC:RRR, no murmurs, rubs, gallops RESPIRATORY:  Clear to auscultation without rales, wheezing or rhonchi  ABDOMEN: Soft, non-tender, non-distended MUSCULOSKELETAL:  No edema; No deformity  SKIN: Warm and dry NEUROLOGIC:  Alert and oriented x 3 PSYCHIATRIC:  Normal affect    ASSESSMENT & PLAN:    1.  OSA - The patient is tolerating PAP therapy well without any problems. The PAP download performed by his DME was personally reviewed and interpreted by me today and showed an AHI of 3.9/hr  on 15 cm H2O with 87% compliance in using more than 4 hours nightly.  The patient has been using and benefiting from PAP use and will continue to  benefit from therapy.   2.  Persistent atrial fibrillation/atrial flutter -s/p remote aflutter and afib ablations -he went back into atrial flutter back in Dec 2020 and was seen in office and Multaq stopped and started on Amio load and then DCCV >>followed in EP clinic with Dr. Ladona Ridgel and he is trying to wean him down on the Amio and get him off of Amio -He is maintaining NSR on exam today -Continue prescription drug management with cardizem CD 360mg  daily, Lopressor 100mg  BID and Amio 200mg  BID 2 days weekly and 200mg  daily Mon-Fri and Xarelto 20mg  daily -he denies any bleeding problems -check BMET and CBC   3.  HTN -BP is not controlled on exam today -Continue prescription drug management with Cardizem CD 360mg  daily and Lopressor 100mg  BID -increase Lisinopril to 40mg  daily -check BMET in 1 week -check BP daily for a week and call with result  4.  Morbid Obesity -I have encouraged him to get into a routine exercise program and cut back on carbs and portions.   5.  DCM/Chronic systolic CHF -EF on echo 06/2019 was 35-40% with global HK  -he does not appear volume overloaded on exam today -DCM felt tachy mediated and possibly a component of poorly controlled BP -I will repeat echo now that he has been maintaining NSR to see if LVF has improved.  If still down then will need Coronary CTA and then change ACE I to Entresto and add -continue BB and ACE I  6.  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  Concerns regarding medicines are outlined above.  Tests Ordered: No orders of the defined types were placed in this encounter.  Medication Changes: No orders of the defined types were placed in this encounter.   Disposition:  Follow up 6 months  Signed, , MD  11/22/2020 8:45 AM     [facility]

## 2020-11-29 ENCOUNTER — Other Ambulatory Visit: Payer: BC Managed Care – PPO | Admitting: *Deleted

## 2020-11-29 ENCOUNTER — Other Ambulatory Visit: Payer: Self-pay

## 2020-11-29 ENCOUNTER — Other Ambulatory Visit: Payer: Self-pay | Admitting: Cardiology

## 2020-11-29 DIAGNOSIS — I10 Essential (primary) hypertension: Secondary | ICD-10-CM

## 2020-11-29 DIAGNOSIS — I4819 Other persistent atrial fibrillation: Secondary | ICD-10-CM

## 2020-11-29 LAB — BASIC METABOLIC PANEL
BUN/Creatinine Ratio: 21 — ABNORMAL HIGH (ref 9–20)
BUN: 20 mg/dL (ref 6–24)
CO2: 21 mmol/L (ref 20–29)
Calcium: 9.1 mg/dL (ref 8.7–10.2)
Chloride: 104 mmol/L (ref 96–106)
Creatinine, Ser: 0.94 mg/dL (ref 0.76–1.27)
Glucose: 155 mg/dL — ABNORMAL HIGH (ref 65–99)
Potassium: 4.4 mmol/L (ref 3.5–5.2)
Sodium: 140 mmol/L (ref 134–144)
eGFR: 95 mL/min/{1.73_m2} (ref >59)

## 2020-11-29 LAB — CBC
Hematocrit: 41 % (ref 37.5–51.0)
Hemoglobin: 13.5 g/dL (ref 13.0–17.7)
MCH: 28.5 pg (ref 26.6–33.0)
MCHC: 32.9 g/dL (ref 31.5–35.7)
MCV: 87 fL (ref 79–97)
Platelets: 169 10*3/uL (ref 150–450)
RBC: 4.73 x10E6/uL (ref 4.14–5.80)
RDW: 13.3 % (ref 11.6–15.4)
WBC: 6.6 10*3/uL (ref 3.4–10.8)

## 2020-12-02 ENCOUNTER — Other Ambulatory Visit: Payer: Self-pay | Admitting: Cardiology

## 2020-12-02 NOTE — Telephone Encounter (Signed)
Pt's age 60, wt 160.1 kg, SCr 0.94, CrCl 196.34, last ov w/ TT 11/22/20.

## 2020-12-10 ENCOUNTER — Other Ambulatory Visit: Payer: Self-pay | Admitting: Internal Medicine

## 2020-12-19 ENCOUNTER — Encounter: Payer: Self-pay | Admitting: Cardiology

## 2020-12-19 ENCOUNTER — Ambulatory Visit (HOSPITAL_COMMUNITY): Payer: BC Managed Care – PPO | Attending: Internal Medicine

## 2020-12-19 ENCOUNTER — Other Ambulatory Visit: Payer: Self-pay

## 2020-12-19 DIAGNOSIS — I77810 Thoracic aortic ectasia: Secondary | ICD-10-CM | POA: Insufficient documentation

## 2020-12-19 DIAGNOSIS — I4819 Other persistent atrial fibrillation: Secondary | ICD-10-CM | POA: Insufficient documentation

## 2020-12-19 DIAGNOSIS — G4733 Obstructive sleep apnea (adult) (pediatric): Secondary | ICD-10-CM | POA: Insufficient documentation

## 2020-12-19 DIAGNOSIS — I10 Essential (primary) hypertension: Secondary | ICD-10-CM | POA: Insufficient documentation

## 2020-12-19 LAB — ECHOCARDIOGRAM COMPLETE
Area-P 1/2: 2.19 cm2
P 1/2 time: 670 msec
S' Lateral: 4.4 cm

## 2020-12-30 ENCOUNTER — Other Ambulatory Visit: Payer: Self-pay | Admitting: Cardiology

## 2021-02-01 ENCOUNTER — Other Ambulatory Visit (HOSPITAL_COMMUNITY): Payer: Self-pay | Admitting: Cardiology

## 2021-06-04 ENCOUNTER — Other Ambulatory Visit: Payer: Self-pay | Admitting: Cardiology

## 2021-06-04 DIAGNOSIS — I4819 Other persistent atrial fibrillation: Secondary | ICD-10-CM

## 2021-06-04 NOTE — Telephone Encounter (Signed)
Xarelto 20mg  refill request received. Pt is 60 years old, weight-160.1kg, Crea-0.94 on 11/29/2020, last seen by Dr. 01/29/2021 on 11/22/2020, Diagnosis-Afib, CrCl-196.37ml/min; Dose is appropriate based on dosing criteria. Will send in refill to requested pharmacy.

## 2021-08-06 ENCOUNTER — Other Ambulatory Visit (HOSPITAL_COMMUNITY): Payer: Self-pay | Admitting: Cardiology

## 2021-11-04 ENCOUNTER — Other Ambulatory Visit: Payer: Self-pay | Admitting: Cardiology

## 2021-11-10 ENCOUNTER — Encounter: Payer: Self-pay | Admitting: Internal Medicine

## 2021-11-10 ENCOUNTER — Ambulatory Visit: Payer: BC Managed Care – PPO | Admitting: Internal Medicine

## 2021-11-10 VITALS — BP 150/76 | HR 52 | Ht 75.0 in | Wt 340.0 lb

## 2021-11-10 DIAGNOSIS — I4819 Other persistent atrial fibrillation: Secondary | ICD-10-CM

## 2021-11-10 DIAGNOSIS — I10 Essential (primary) hypertension: Secondary | ICD-10-CM

## 2021-11-10 DIAGNOSIS — I519 Heart disease, unspecified: Secondary | ICD-10-CM

## 2021-11-10 MED ORDER — AMIODARONE HCL 200 MG PO TABS: ORAL_TABLET | ORAL | 3 refills | Status: DC

## 2021-11-10 NOTE — Patient Instructions (Addendum)
Medication Instructions:  Reduce Amiodarone. Take one tablet by mouth daily Monday through Saturday.  Take 2 tablets by mouth on Sunday. Your physician recommends that you continue on your current medications as directed. Please refer to the Current Medication list given to you today. *If you need a refill on your cardiac medications before your next appointment, please call your pharmacy*  Lab Work: None. If you have labs (blood work) drawn today and your tests are completely normal, you will receive your results only by: MyChart Message (if you have MyChart) OR A paper copy in the mail If you have any lab test that is abnormal or we need to change your treatment, we will call you to review the results.  Testing/Procedures: None.  Follow-Up: At [facility], you and your health needs are our priority.  As part of our continuing mission to provide you with exceptional heart care, we have created designated Provider Care Teams.  These Care Teams include your primary Cardiologist (physician) and Advanced Practice Providers (APPs -  Physician Assistants and Nurse Practitioners) who all work together to provide you with the care you need, when you need it.  Your physician wants you to follow-up in: 12 months with Lewayne Bunting, MD or one of the following Advanced Practice Providers on your designated Care Team:    Francis Dowse, [place] Casimiro Needle "Mardelle Matte" [place], [place]   You will receive a reminder letter in the mail two months in advance. If you don't receive a letter, please call our office to schedule the follow-up appointment.  We recommend signing up for the patient portal called "MyChart".  Sign up information is provided on this After Visit Summary.  MyChart is used to connect with patients for Virtual Visits (Telemedicine).  Patients are able to view lab/test results, encounter notes, upcoming appointments, etc.  Non-urgent messages can be sent to your provider as well.   To learn more about  what you can do with MyChart, go to [email or web address].    Any Other Special Instructions Will Be Listed Below (If Applicable).

## 2021-11-10 NOTE — Progress Notes (Signed)
HPI [name] returns today for followup of his atrial fib. He is a pleasant morbidly obese 61 yo man with HTN, mixed congestive heart failure who was seen by me 12 months ago. In the interim, he has done well and has lost 12 lbs. He denies chest pain or sob. He notes that his bp is still elevated though better at home. Allergies  Allergen Reactions   Plavix [Clopidogrel Bisulfate] Rash     Current Outpatient Medications  Medication Sig Dispense Refill   acetaminophen (TYLENOL) 500 MG tablet Take 1,000 mg by mouth every 6 (six) hours as needed for moderate pain.     amiodarone (PACERONE) 200 MG tablet Take one tablet by mouth daily Monday through Friday.  Take 2 tablets by mouth Saturday and Sunday. 120 tablet 3   diltiazem (CARDIZEM CD) 360 MG 24 hr capsule TAKE 1 CAPSULE DAILY - KEEP JUNE APPT WITH DR TURNER FOR MORE REFILLS 90 capsule 0   fenofibrate (TRICOR) 145 MG tablet Take 145 mg by mouth daily.     furosemide (LASIX) 20 MG tablet TAKE 1 TABLET AS NEEDED FOR EXCESS SWELLING/WEIGHT GAIN 2-3LBS PER DAY OR 5LBS PER WEEK 30 tablet 11   ketoconazole (NIZORAL) 2 % cream Apply 1 application topically daily.     lisinopril (ZESTRIL) 40 MG tablet Take 1 tablet (40 mg total) by mouth daily. 90 tablet 3   meloxicam (MOBIC) 15 MG tablet Take 15 mg by mouth daily.     metoprolol tartrate (LOPRESSOR) 100 MG tablet Take 1 tablet by mouth every morning and 1 1/2 tablets every evening. 225 tablet 1   omeprazole (PRILOSEC) 40 MG capsule Take 40 mg by mouth daily.      tamsulosin (FLOMAX) 0.4 MG CAPS capsule Take 0.4 mg by mouth at bedtime.     XARELTO 20 MG TABS tablet TAKE ONE TABLET BY MOUTH ONE TIME DAILY WITH SUPPER 90 tablet 1   No current facility-administered medications for this visit.     Past Medical History:  Diagnosis Date   Ascending aorta dilatation ([place])    61mm by echo 11/2020   Bradycardia 10/21/2015   Dyslipidemia    HTN (hypertension)    NICM (nonischemic  cardiomyopathy) ([place])    a. [place] 2002: no CAD, EF 35-40%; b. Echo 2003: EF 60% - tachycardia mediated (AFlutter with RVR).  EF 50% on echo 11/2020   Obesity    Persistent atrial fibrillation (HCC)    CHADS2VASC score is 2.  s/p remote afib and aflutter ablations   Severe obstructive sleep apnea    , CPAP at 14cm H2O   Varicose veins     ROS:   All systems reviewed and negative except as noted in the HPI.   Past Surgical History:  Procedure Laterality Date   CARDIAC SURGERY     CARDIOVERSION N/A 09/07/2019   Procedure: CARDIOVERSION;  Surgeon: Elouise Munroe, MD;  Location: [facility] ENDOSCOPY;  Service: Cardiovascular;  Laterality: N/A;   COLONOSCOPY       Family History  Problem Relation Age of Onset   Hypertension Mother    Hypertension Father    Heart disease Father      Social History   Socioeconomic History   Marital status: Married    Spouse name: Not on file   Number of children: Not on file   Years of education: Not on file   Highest education level: Not on file  Occupational History   Not on  file  Tobacco Use   Smoking status: Never   Smokeless tobacco: Never  Vaping Use   Vaping Use: Never used  Substance and Sexual Activity   Alcohol use: Yes    Alcohol/week: 3.0 standard drinks    Types: 1 Standard drinks or equivalent, 1 Cans of beer, 1 Glasses of wine per week    Comment: occassional beer and wine   Drug use: No   Sexual activity: Not on file  Other Topics Concern   Not on file  Social History Narrative   Not on file   Social Determinants of Health   Financial Resource Strain: Not on file  Food Insecurity: Not on file  Transportation Needs: Not on file  Physical Activity: Not on file  Stress: Not on file  Social Connections: Not on file  Intimate Partner Violence: Not on file     BP (!) 150/76   Pulse (!) 52   Ht 6\' 3"  (1.905 m)   Wt (!) 340 lb (154.2 kg)   SpO2 96%   BMI 42.50 kg/m   Physical Exam:  Well appearing NAD HEENT:  Unremarkable Neck:  No JVD, no thyromegally Lymphatics:  No adenopathy Back:  No CVA tenderness Lungs:  Clear HEART:  Regular rate rhythm, no murmurs, no rubs, no clicks Abd:  soft, positive bowel sounds, no organomegally, no rebound, no guarding Ext:  2 plus pulses, no edema, no cyanosis, no clubbing Skin:  No rashes no nodules Neuro:  CN II through XII intact, motor grossly intact  EKG - sinus bradycardia   Assess/Plan:  1. Persistent atrial fib - he is maintaining NSR. He will reduce his dose of amiodarone to 200 bid for 1 day a week and 200 daily Mon-Sat. 2. Obesity - I encouraged him to lose weight. We discussed intermittent fasting 3. HTN - his bp is up a bit. I also asked him to reduce his salt intake and to lose weight 4. Chronic systolic heart failure - his symptoms are class 2. He is on good medical therapy.    Carleene Overlie Shade [place],MD

## 2021-12-02 ENCOUNTER — Other Ambulatory Visit: Payer: Self-pay | Admitting: Cardiology

## 2021-12-02 DIAGNOSIS — I4819 Other persistent atrial fibrillation: Secondary | ICD-10-CM

## 2021-12-02 NOTE — Telephone Encounter (Signed)
Xarelto 20mg  refill request received. Pt is 61 years old, weight-154.2kg, Crea-1.01 on 09/11/21 via [facility] from [facility], last seen by Dr. [facility] on 11/10/2021, Diagnosis-Afib, CrCl-186.42ml/min; Dose is appropriate based on dosing criteria. Will send in refill to requested pharmacy.

## 2021-12-05 ENCOUNTER — Other Ambulatory Visit: Payer: Self-pay | Admitting: Cardiology

## 2021-12-15 IMAGING — US US SCROTUM W/ DOPPLER COMPLETE
1 series · 14 of 25 positions shown · non-contrast
Comparison: None.

CLINICAL DATA: Left testicular swelling

EXAM:
SCROTAL ULTRASOUND
DOPPLER ULTRASOUND OF THE TESTICLES
TECHNIQUE: Complete ultrasound examination of the testicles, epididymis, and
other scrotal structures was performed. Color and spectral Doppler
ultrasound were also utilized to evaluate blood flow to the
testicles.

[Series 1: us scrotum w/ doppler complete · 48 acquisitions, 14 frames shown]
[im 1/48]
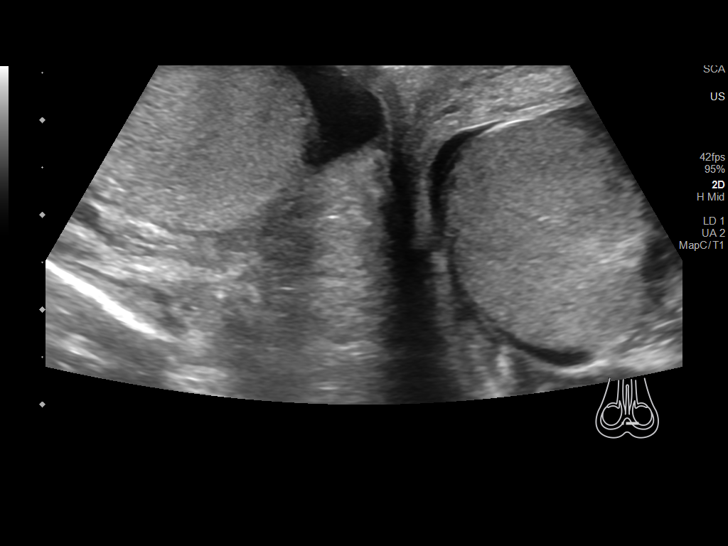
[im 4/48]
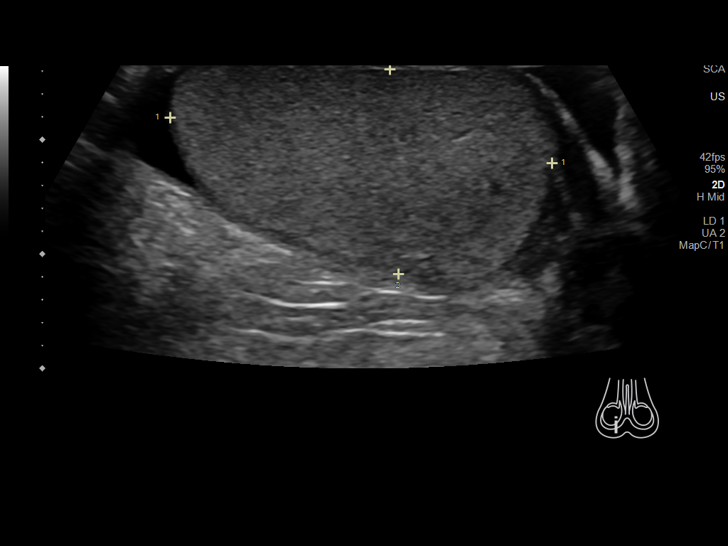
[im 8/48]
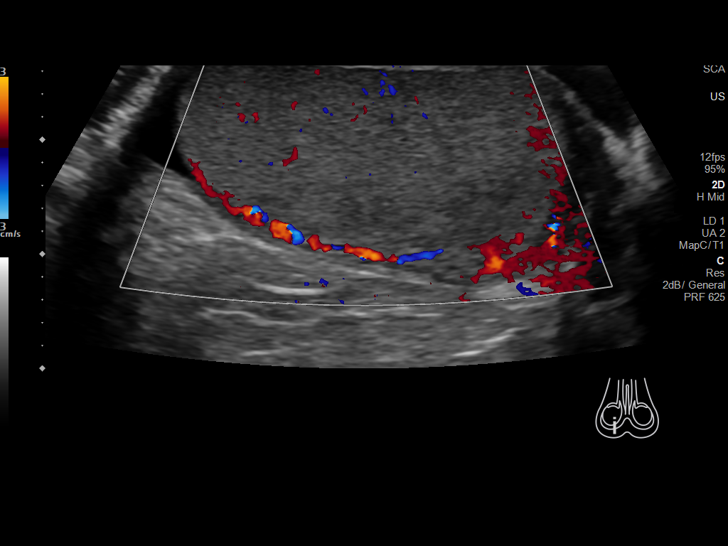
[im 12/48]
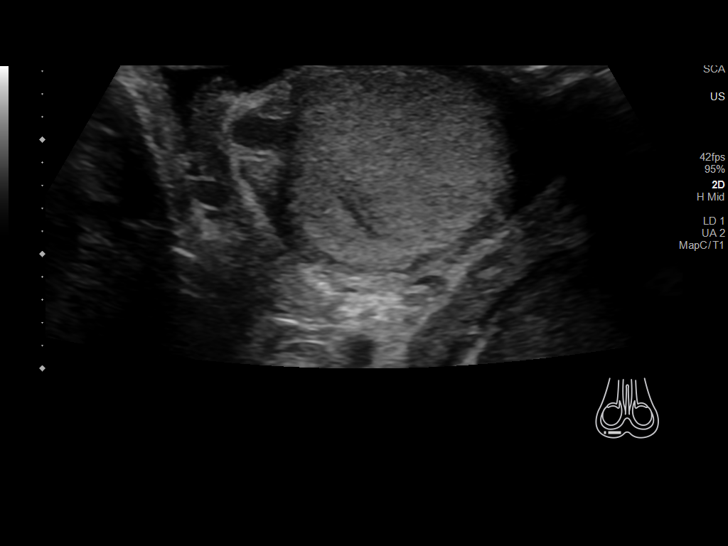
[im 16/48]
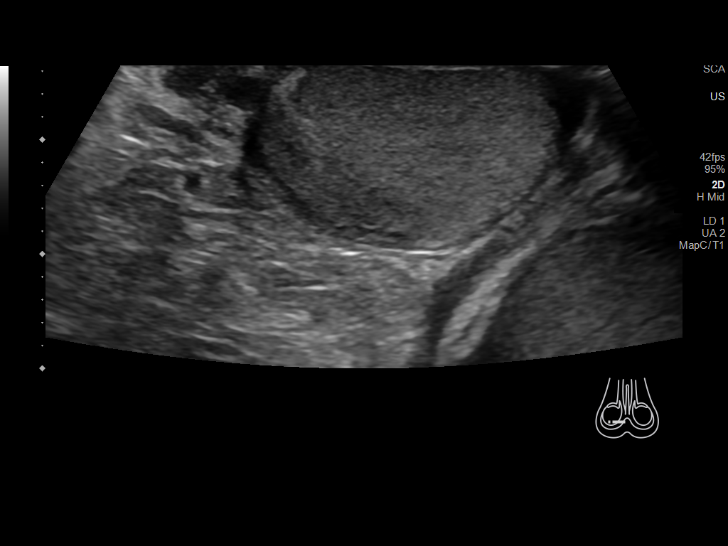
[im 18/48]
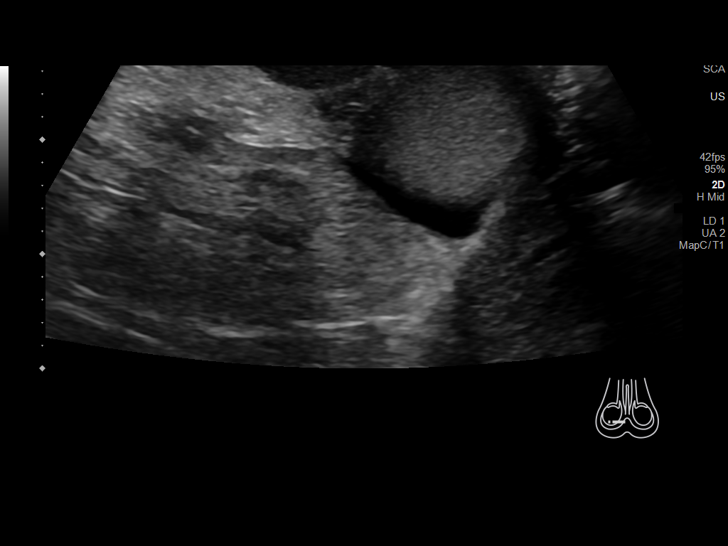
[im 22/48]
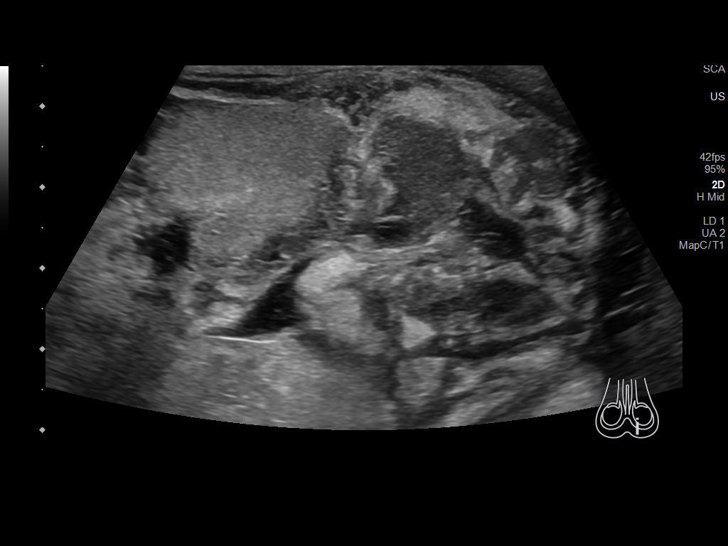
[im 26/48]
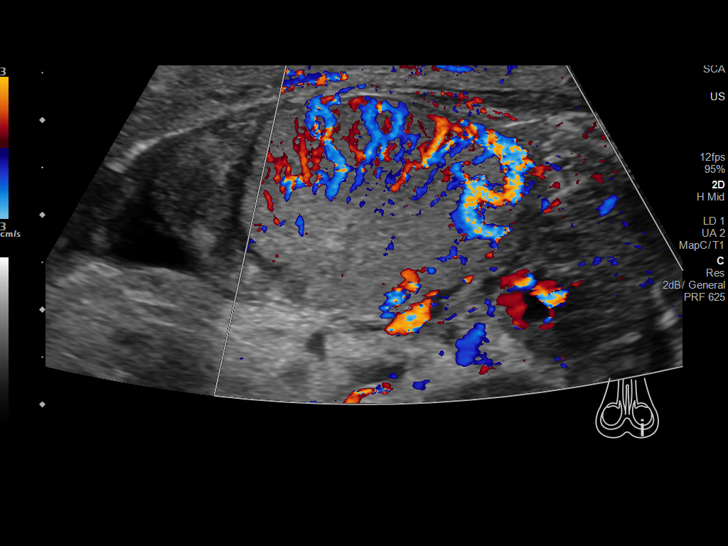
[im 30/48]
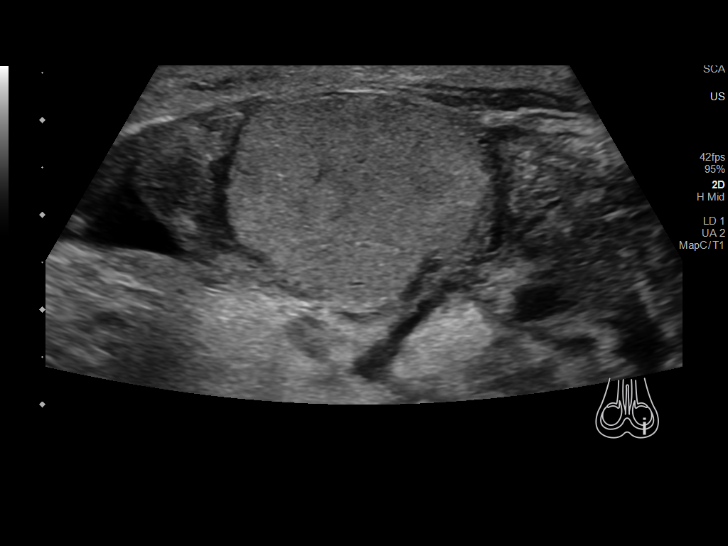
[im 32/48]
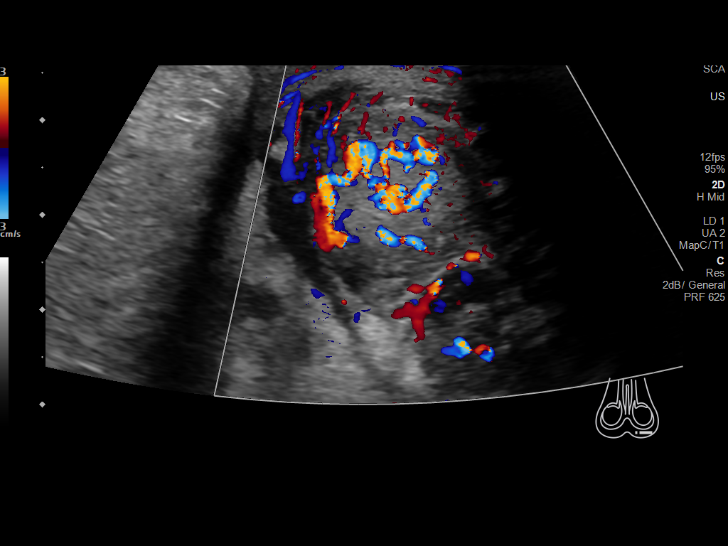
[im 36/48]
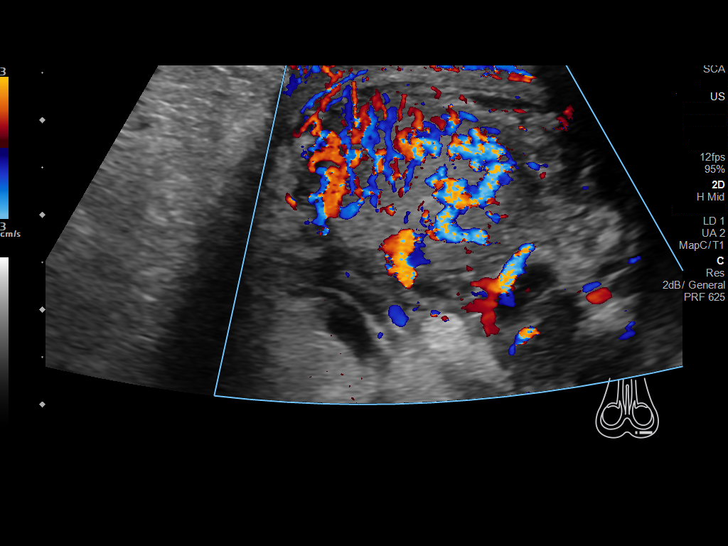
[im 40/48]
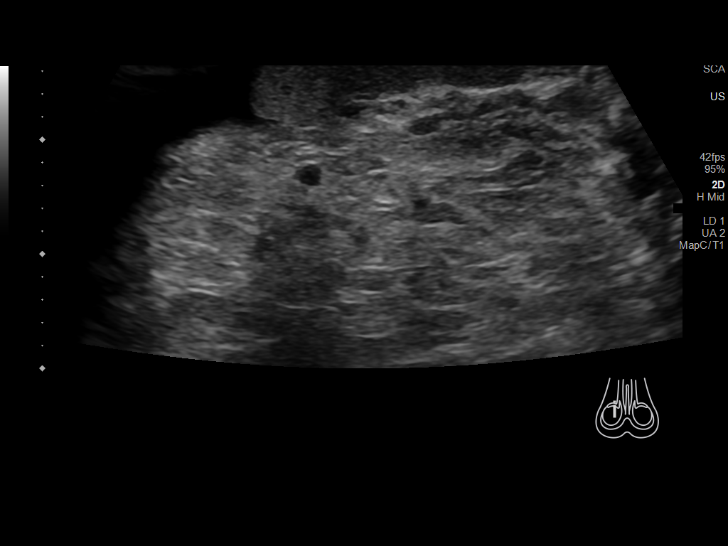
[im 44/48]
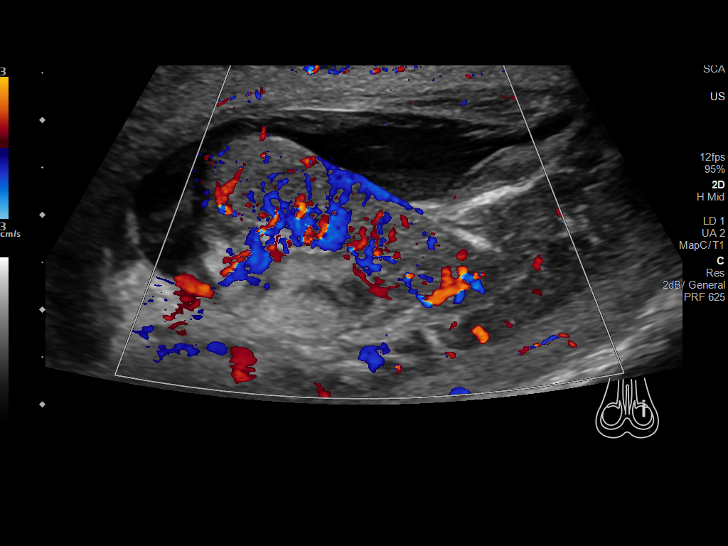
[im 48/48]
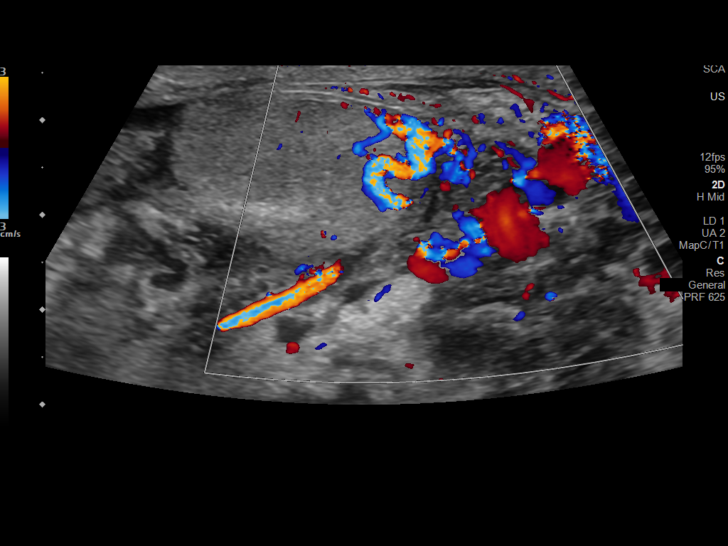

[14 of 25 positions shown; findings below may reference images not displayed]

FINDINGS: Right testicle

Measurements: 3.4 x 1.8 x 2.4 cm. No mass or microlithiasis
visualized.

Left testicle

Measurements: 3.0 x 2.1 x 2.7 cm. No mass or microlithiasis
visualized. Relative hypervascularity within the left testis
compared to the right.

Right epididymis:  Normal in size and appearance.

Left epididymis: Enlarged, heterogeneous and hypervascular in
appearance.

Hydrocele:  None visualized.

Varicocele:  Small bilateral.

Pulsed Doppler interrogation of both testes demonstrates normal low
resistance arterial and venous waveforms bilaterally.
IMPRESSION: 1. Negative for testicular torsion or intratesticular mass.
2. Findings of acute left epididymo-orchitis.
3. Small bilateral hydroceles, likely reactive.

## 2021-12-17 ENCOUNTER — Ambulatory Visit: Payer: BC Managed Care – PPO | Admitting: Cardiology

## 2021-12-17 ENCOUNTER — Encounter: Payer: Self-pay | Admitting: Cardiology

## 2021-12-17 ENCOUNTER — Other Ambulatory Visit: Payer: Self-pay | Admitting: Cardiology

## 2021-12-17 VITALS — BP 152/80 | HR 58 | Ht 75.0 in | Wt 342.4 lb

## 2021-12-17 DIAGNOSIS — G4733 Obstructive sleep apnea (adult) (pediatric): Secondary | ICD-10-CM

## 2021-12-17 DIAGNOSIS — I428 Other cardiomyopathies: Secondary | ICD-10-CM

## 2021-12-17 DIAGNOSIS — I480 Paroxysmal atrial fibrillation: Secondary | ICD-10-CM

## 2021-12-17 DIAGNOSIS — I77810 Thoracic aortic ectasia: Secondary | ICD-10-CM

## 2021-12-17 DIAGNOSIS — R030 Elevated blood-pressure reading, without diagnosis of hypertension: Secondary | ICD-10-CM

## 2021-12-17 DIAGNOSIS — I10 Essential (primary) hypertension: Secondary | ICD-10-CM

## 2021-12-17 NOTE — Patient Instructions (Signed)
Medication Instructions:  Your physician recommends that you continue on your current medications as directed. Please refer to the Current Medication list given to you today.  *If you need a refill on your cardiac medications before your next appointment, please call your pharmacy*   Lab Work: Lab work to be done today-CBC and BMP If you have labs (blood work) drawn today and your tests are completely normal, you will receive your results only by: MyChart Message (if you have MyChart) OR A paper copy in the mail If you have any lab test that is abnormal or we need to change your treatment, we will call you to review the results.   Testing/Procedures: Your physician has requested that you have a limited  echocardiogram. Echocardiography is a painless test that uses sound waves to create images of your heart. It provides your doctor with information about the size and shape of your heart and how well your heart's chambers and valves are working. This procedure takes approximately one hour. There are no restrictions for this procedure.  Dr Mayford Knife recommends you wear a blood pressure monitor for 48 hours  Follow-Up: At [facility], you and your health needs are our priority.  As part of our continuing mission to provide you with exceptional heart care, we have created designated Provider Care Teams.  These Care Teams include your primary Cardiologist (physician) and Advanced Practice Providers (APPs -  Physician Assistants and Nurse Practitioners) who all work together to provide you with the care you need, when you need it.  We recommend signing up for the patient portal called "MyChart".  Sign up information is provided on this After Visit Summary.  MyChart is used to connect with patients for Virtual Visits (Telemedicine).  Patients are able to view lab/test results, encounter notes, upcoming appointments, etc.  Non-urgent messages can be sent to your provider as well.   To learn more about  what you can do with MyChart, go to [email or web address].    Your next appointment:   6 month(s)  The format for your next appointment:   In Person  Provider:   Dr Mayford Knife  If primary card or EP is not listed click here to update    :1}    Other Instructions    Important Information About Sugar

## 2021-12-17 NOTE — Progress Notes (Signed)
Date:  12/17/2021   ID:  [name], DOB [birth year], MRN [id number]   PCP:  [name], MD  Cardiologist:  [name], MD Electrophysiologist:  None   Chief Complaint:  OSA, HTN, Afib  History of Present Illness:    JSON KOELZER is a 61 y.o. male with a hx of severe OSA on CPAP, obesity, HTN and persistent atrial fibrillation on chronic anticoagulation. He is here today for followup and is doing well.  He denies any chest pain or pressure, SOB, DOE, PND, orthopnea, LE edema, dizziness, palpitations or syncope. He is compliant with his meds and is tolerating meds with no SE.     He is doing well with his CPAP device and thinks that He has gotten used to it.  He tolerates the mask and feels the pressure is adequate.  Since going on CPAP he feels rested in the am and has no significant daytime sleepiness.  He denies any significant mouth or nasal dryness or nasal congestion.  He does not think that he snores.     Prior CV studies:   The following studies were reviewed today:  PAP compliance download, labs from PCP  Past Medical History:  Diagnosis Date   Ascending aorta dilatation (HCC)    6mm by echo 11/2020   Bradycardia 10/21/2015   Dyslipidemia    HTN (hypertension)    NICM (nonischemic cardiomyopathy) (HCC)    a. LHC 2002: no CAD, EF 35-40%; b. Echo 2003: EF 60% - tachycardia mediated (AFlutter with RVR).  EF 50% on echo 11/2020   Obesity    Persistent atrial fibrillation (HCC)    CHADS2VASC score is 2.  s/p remote afib and aflutter ablations   Severe obstructive sleep apnea    , CPAP at 14cm H2O   Varicose veins    Past Surgical History:  Procedure Laterality Date   CARDIAC SURGERY     CARDIOVERSION N/A 09/07/2019   Procedure: CARDIOVERSION;  Surgeon: Parke Poisson, MD;  Location: [facility] ENDOSCOPY;  Service: Cardiovascular;  Laterality: N/A;   COLONOSCOPY       Current Meds  Medication Sig   acetaminophen (TYLENOL) 500 MG tablet Take 1,000 mg by  mouth every 6 (six) hours as needed for moderate pain.   amiodarone (PACERONE) 200 MG tablet Take one tablet by mouth daily Monday through Saturday.  Take 2 tablets by mouth on Sunday.   diltiazem (CARDIZEM CD) 360 MG 24 hr capsule TAKE 1 CAPSULE DAILY - KEEP JUNE APPT WITH DR Phillippa Straub FOR MORE REFILLS   fenofibrate (TRICOR) 145 MG tablet Take 145 mg by mouth daily.   furosemide (LASIX) 20 MG tablet TAKE 1 TABLET AS NEEDED FOR EXCESS SWELLING/WEIGHT GAIN 2-3LBS PER DAY OR 5LBS PER WEEK   ketoconazole (NIZORAL) 2 % cream Apply 1 application topically daily.   lisinopril (ZESTRIL) 40 MG tablet Take 1 tablet (40 mg total) by mouth daily.   meloxicam (MOBIC) 15 MG tablet Take 15 mg by mouth daily.   metoprolol tartrate (LOPRESSOR) 100 MG tablet Take 1 tablet by mouth every morning and 1 1/2 tablets every evening.   omeprazole (PRILOSEC) 40 MG capsule Take 40 mg by mouth daily.    rivaroxaban (XARELTO) 20 MG TABS tablet TAKE ONE TABLET BY MOUTH ONE TIME DAILY WITH SUPPER   tamsulosin (FLOMAX) 0.4 MG CAPS capsule Take 0.4 mg by mouth at bedtime.     Allergies:   Plavix [clopidogrel bisulfate]   Social History   Tobacco Use  Smoking status: Never   Smokeless tobacco: Never  Vaping Use   Vaping Use: Never used  Substance Use Topics   Alcohol use: Yes    Alcohol/week: 3.0 standard drinks of alcohol    Types: 1 Standard drinks or equivalent, 1 Cans of beer, 1 Glasses of wine per week    Comment: occassional beer and wine   Drug use: No     Family Hx: The patient's family history includes Heart disease in his father; Hypertension in his father and mother.  ROS:   Please see the history of present illness.     All other systems reviewed and are negative.   Labs/Other Tests and Data Reviewed:    Recent Labs: No results found for requested labs within last 365 days.   Recent Lipid Panel Lab Results  Component Value Date/Time   CHOL  04/11/2009 04:51 AM    105        ATP III  CLASSIFICATION:  <200     mg/dL   Desirable  200-239  mg/dL   Borderline High  >=240    mg/dL   High          TRIG 98 04/11/2009 04:51 AM   HDL 23 (L) 04/11/2009 04:51 AM   CHOLHDL 4.6 04/11/2009 04:51 AM   LDLCALC  04/11/2009 04:51 AM    62        Total Cholesterol/HDL:CHD Risk Coronary Heart Disease Risk Table                     Men   Women  1/2 Average Risk   3.4   3.3  Average Risk       5.0   4.4  2 X Average Risk   9.6   7.1  3 X Average Risk  23.4   11.0        Use the calculated Patient Ratio above and the CHD Risk Table to determine the patient's CHD Risk.        ATP III CLASSIFICATION (LDL):  <100     mg/dL   Optimal  100-129  mg/dL   Near or Above                    Optimal  130-159  mg/dL   Borderline  160-189  mg/dL   High  >190     mg/dL   Very High    Wt Readings from Last 3 Encounters:  12/17/21 (!) 342 lb 6.4 oz (155.3 kg)  11/10/21 (!) 340 lb (154.2 kg)  11/22/20 (!) 353 lb (160.1 kg)     Objective:    Vital Signs:  BP (!) 152/80   Pulse (!) 58   Ht 6\' 3"  (1.905 m)   Wt (!) 342 lb 6.4 oz (155.3 kg)   SpO2 97%   BMI 42.80 kg/m   GEN: Well nourished, well developed in no acute distress HEENT: Normal NECK: No JVD; No carotid bruits LYMPHATICS: No lymphadenopathy CARDIAC:RRR, no murmurs, rubs, gallops RESPIRATORY:  Clear to auscultation without rales, wheezing or rhonchi  ABDOMEN: Soft, non-tender, non-distended MUSCULOSKELETAL:  No edema; No deformity  SKIN: Warm and dry NEUROLOGIC:  Alert and oriented x 3 PSYCHIATRIC:  Normal affect    ASSESSMENT & PLAN:    1.  OSA - The patient is tolerating PAP therapy well without any problems. The PAP download performed by his DME was personally reviewed and interpreted by me today and showed an AHI of 4.1/hr  on 15 cm H2O with 97% compliance in using more than 4 hours nightly.  The patient has been using and benefiting from PAP use and will continue to benefit from therapy.    2.  Persistent atrial  fibrillation/atrial flutter -s/p remote aflutter and afib ablations -he went back into atrial flutter back in Dec 2020 and was seen in office and Multaq stopped and started on Amio load and then DCCV >>followed in EP clinic with Dr. Ladona Ridgel and he is trying to wean him down on the Amio and get him off of Amio -He is maintaining normal sinus rhythm on exam today denies any palpitations -Continue prescription drug management with Cardizem CD 360 mg daily, Pacerone 200 mg daily except for 400mg  on Sunday, I will Lopressor 100 mg every morning and 150 mg every afternoon and Xarelto 20 mg daily with as needed refills -he denies any bleeding problems -Check CBC and BMET today  3.  HTN -BP is borderline controlled on exam today -Continue prescription drug management Cardizem CD 360 mg daily, Lopressor 100 mg every morning and 150 mg every afternoon and lisinopril 40 mg daily with as needed refills -check a 48 hour BP monitor  4.  DCM/Chronic systolic CHF -EF on echo 06/2019 was 35-40% with global HK  -DCM felt tachy mediated and possibly a component of poorly controlled BP -Repeat 2D echo done 12/09/2020 showed low normal LV function with EF 50% with mild LVH mild left atrial enlargement -he appears euvolemic on exam today -Continue prescription drug management with Lopressor and ACE inhibitor  5.  Dilated ascending aorta -62mm by echo 06/2019 and 2022 -Repeat limited echo to reassess    Medication Adjustments/Labs and Tests Ordered: Current medicines are reviewed at length with the patient today.  Concerns regarding medicines are outlined above.  Tests Ordered: No orders of the defined types were placed in this encounter.  Medication Changes: No orders of the defined types were placed in this encounter.   Disposition:  Follow up 6 months  Signed, 2023, MD  12/17/2021 8:19 AM    [facility]

## 2021-12-17 NOTE — Addendum Note (Signed)
Addended by: Dossie Arbour on: 12/17/2021 08:33 AM   Modules accepted: Orders

## 2021-12-18 LAB — BASIC METABOLIC PANEL
BUN/Creatinine Ratio: 21 — ABNORMAL HIGH (ref 9–20)
BUN: 17 mg/dL (ref 6–24)
CO2: 22 mmol/L (ref 20–29)
Calcium: 9 mg/dL (ref 8.7–10.2)
Chloride: 101 mmol/L (ref 96–106)
Creatinine, Ser: 0.81 mg/dL (ref 0.76–1.27)
Glucose: 168 mg/dL — ABNORMAL HIGH (ref 70–99)
Potassium: 4.1 mmol/L (ref 3.5–5.2)
Sodium: 140 mmol/L (ref 134–144)
eGFR: 102 mL/min/{1.73_m2} (ref >59)

## 2021-12-18 LAB — CBC
Hematocrit: 40.4 % (ref 37.5–51.0)
Hemoglobin: 13.4 g/dL (ref 13.0–17.7)
MCH: 27.9 pg (ref 26.6–33.0)
MCHC: 33.2 g/dL (ref 31.5–35.7)
MCV: 84 fL (ref 79–97)
Platelets: 219 10*3/uL (ref 150–450)
RBC: 4.81 x10E6/uL (ref 4.14–5.80)
RDW: 12.9 % (ref 11.6–15.4)
WBC: 5.5 10*3/uL (ref 3.4–10.8)

## 2022-01-02 ENCOUNTER — Ambulatory Visit (HOSPITAL_COMMUNITY): Payer: BC Managed Care – PPO | Attending: Cardiology

## 2022-01-02 DIAGNOSIS — Z0289 Encounter for other administrative examinations: Secondary | ICD-10-CM

## 2022-01-02 DIAGNOSIS — I5022 Chronic systolic (congestive) heart failure: Secondary | ICD-10-CM | POA: Diagnosis not present

## 2022-01-02 DIAGNOSIS — I77810 Thoracic aortic ectasia: Secondary | ICD-10-CM | POA: Diagnosis not present

## 2022-01-02 LAB — ECHOCARDIOGRAM LIMITED
Area-P 1/2: 2.89 cm2
P 1/2 time: 1078 msec
S' Lateral: 3.2 cm

## 2022-01-14 ENCOUNTER — Encounter (INDEPENDENT_AMBULATORY_CARE_PROVIDER_SITE_OTHER): Payer: Self-pay | Admitting: Bariatrics

## 2022-01-14 ENCOUNTER — Ambulatory Visit (INDEPENDENT_AMBULATORY_CARE_PROVIDER_SITE_OTHER): Payer: BC Managed Care – PPO | Admitting: Bariatrics

## 2022-01-14 VITALS — BP 165/74 | HR 59 | Temp 97.6°F | Ht 72.0 in | Wt 339.0 lb

## 2022-01-14 DIAGNOSIS — E559 Vitamin D deficiency, unspecified: Secondary | ICD-10-CM

## 2022-01-14 DIAGNOSIS — E785 Hyperlipidemia, unspecified: Secondary | ICD-10-CM

## 2022-01-14 DIAGNOSIS — I10 Essential (primary) hypertension: Secondary | ICD-10-CM | POA: Diagnosis not present

## 2022-01-14 DIAGNOSIS — Z1331 Encounter for screening for depression: Secondary | ICD-10-CM | POA: Diagnosis not present

## 2022-01-14 DIAGNOSIS — I4819 Other persistent atrial fibrillation: Secondary | ICD-10-CM

## 2022-01-14 DIAGNOSIS — R0602 Shortness of breath: Secondary | ICD-10-CM | POA: Diagnosis not present

## 2022-01-14 DIAGNOSIS — Z6841 Body Mass Index (BMI) 40.0 and over, adult: Secondary | ICD-10-CM

## 2022-01-14 DIAGNOSIS — R7303 Prediabetes: Secondary | ICD-10-CM

## 2022-01-14 DIAGNOSIS — I77819 Aortic ectasia, unspecified site: Secondary | ICD-10-CM

## 2022-01-14 DIAGNOSIS — G4733 Obstructive sleep apnea (adult) (pediatric): Secondary | ICD-10-CM

## 2022-01-14 DIAGNOSIS — I77810 Thoracic aortic ectasia: Secondary | ICD-10-CM

## 2022-01-14 DIAGNOSIS — R5383 Other fatigue: Secondary | ICD-10-CM | POA: Diagnosis not present

## 2022-01-15 ENCOUNTER — Telehealth: Payer: Self-pay | Admitting: *Deleted

## 2022-01-15 ENCOUNTER — Encounter (INDEPENDENT_AMBULATORY_CARE_PROVIDER_SITE_OTHER): Payer: Self-pay | Admitting: Bariatrics

## 2022-01-15 DIAGNOSIS — E786 Lipoprotein deficiency: Secondary | ICD-10-CM | POA: Insufficient documentation

## 2022-01-15 LAB — LIPID PANEL WITH LDL/HDL RATIO
Cholesterol, Total: 165 mg/dL (ref 100–199)
HDL: 32 mg/dL — ABNORMAL LOW (ref >39)
LDL Chol Calc (NIH): 109 mg/dL — ABNORMAL HIGH (ref 0–99)
LDL/HDL Ratio: 3.4 ratio (ref 0.0–3.6)
Triglycerides: 134 mg/dL (ref 0–149)
VLDL Cholesterol Cal: 24 mg/dL (ref 5–40)

## 2022-01-15 LAB — TSH+T4F+T3FREE
Free T4: 1.59 ng/dL (ref 0.82–1.77)
T3, Free: 3.1 pg/mL (ref 2.0–4.4)
TSH: 0.488 u[IU]/mL (ref 0.450–4.500)

## 2022-01-15 LAB — HEMOGLOBIN A1C
Est. average glucose Bld gHb Est-mCnc: 163 mg/dL
Hgb A1c MFr Bld: 7.3 % — ABNORMAL HIGH (ref 4.8–5.6)

## 2022-01-15 LAB — INSULIN, RANDOM: INSULIN: 27.1 u[IU]/mL — ABNORMAL HIGH (ref 2.6–24.9)

## 2022-01-15 LAB — VITAMIN D 25 HYDROXY (VIT D DEFICIENCY, FRACTURES): Vit D, 25-Hydroxy: 26.4 ng/mL — ABNORMAL LOW (ref 30.0–100.0)

## 2022-01-15 NOTE — Telephone Encounter (Signed)
Attempted to contact patient to schedule 24 hour ambulatory blood pressure monitor.  There was no answer and no voicemail.

## 2022-01-21 ENCOUNTER — Telehealth: Payer: Self-pay | Admitting: *Deleted

## 2022-01-21 NOTE — Telephone Encounter (Signed)
Second attempt to contact patient to schedule 24 hour ambulatory blood pressure monitor. No answer, No Voicemail.

## 2022-01-26 ENCOUNTER — Encounter (INDEPENDENT_AMBULATORY_CARE_PROVIDER_SITE_OTHER): Payer: Self-pay | Admitting: Bariatrics

## 2022-01-26 NOTE — Progress Notes (Signed)
Chief Complaint:   OBESITY Geoffrey Robinson (MR# [id number]) is a 61 y.o. male who presents for evaluation and treatment of obesity and related comorbidities. Current BMI is Body mass index is 45.98 kg/m. Geoffrey Robinson has been struggling with his weight for many years and has been unsuccessful in either losing weight, maintaining weight loss, or reaching his healthy weight goal.  Geoffrey Robinson needs to lose weight for knee replacement, with BMI below 40 per the orthopedist.  Geoffrey Robinson is currently in the action stage of change and ready to dedicate time achieving and maintaining a healthier weight. Geoffrey Robinson is interested in becoming our patient and working on intensive lifestyle modifications including (but not limited to) diet and exercise for weight loss.  Geoffrey Robinson's habits were reviewed today and are as follows: His family eats meals together, he thinks his family will eat healthier with him, his desired weight loss is 89 lbs, he has been heavy most of his life, he started gaining weight over time, his heaviest weight ever was 392 pounds, he snacks frequently in the evenings, he is frequently drinking liquids with calories, he frequently makes poor food choices, he frequently eats larger portions than normal, and he struggles with emotional eating.  Depression Screen Geoffrey Robinson's Food and Mood (modified PHQ-9) score was 11.     01/14/2022    7:50 AM  Depression screen PHQ 2/9  Decreased Interest 2  Down, Depressed, Hopeless 0  PHQ - 2 Score 2  Altered sleeping 1  Tired, decreased energy 2  Change in appetite 3  Feeling bad or failure about yourself  0  Trouble concentrating 0  Moving slowly or fidgety/restless 3  Suicidal thoughts 0  PHQ-9 Score 11  Difficult doing work/chores Not difficult at all   Subjective:   1. Other fatigue Geoffrey Robinson admits to daytime somnolence and admits to waking up still tired. Patient has a history of symptoms of daytime fatigue and morning fatigue. Geoffrey Robinson  generally gets 5 hours of sleep per night, and states that he has nightime awakenings. Snoring is present. Apneic episodes are present. Epworth Sleepiness Score is 17.   2. SOB (shortness of breath) on exertion Geoffrey Robinson notes increasing shortness of breath with exercising and seems to be worsening over time with weight gain. He notes getting out of breath sooner with activity than he used to. This has not gotten worse recently. Geoffrey Robinson denies shortness of breath at rest or orthopnea.  3. Primary hypertension Geoffrey Robinson is taking amlodipine, diltiazem, and lisinopril.  He took his medications this morning.  His blood pressure is slightly elevated.  4. Persistent atrial fibrillation (HCC) Geoffrey Robinson is on Xarelto and he had a cardiac ablation.  5. Severe obstructive sleep apnea Geoffrey Robinson wears his CPAP nightly.  6. Dyslipidemia Geoffrey Robinson is currently taking fenofibrate.  7. Ascending aorta dilatation (HCC) Geoffrey Robinson is following up with his cardiologist for his ascending aorta dilatation.  8. Vitamin D deficiency Geoffrey Robinson is not currently on vitamin D supplementation.  9. Prediabetes Geoffrey Robinson is not on medications currently.  Assessment/Plan:   1. Other fatigue Geoffrey Robinson does feel that his weight is causing his energy to be lower than it should be. Fatigue may be related to obesity, depression or many other causes. Labs will be ordered, and in the meanwhile, Geoffrey Robinson will focus on self care including making healthy food choices, increasing physical activity and focusing on stress reduction.  - TSH+T4F+T3Free  2. SOB (shortness of breath) on exertion Geoffrey Robinson does feel that he gets out of breath  more easily that he used to when he exercises. Geoffrey Robinson's shortness of breath appears to be obesity related and exercise induced. He has agreed to work on weight loss and gradually increase exercise to treat his exercise induced shortness of breath. Will continue to monitor closely.  3. Primary  hypertension Geoffrey Robinson will continue his medications and he will check his blood pressure at home.  4. Persistent atrial fibrillation (HCC) Geoffrey Robinson will continue Xarelto, and he will follow-up with his cardiologist.  5. Severe obstructive sleep apnea Geoffrey Robinson will continue to wear his CPAP nightly.  6. Dyslipidemia We will check labs today, and Geoffrey Robinson will continue his medications as directed.  - Lipid Panel With LDL/HDL Ratio  7. Ascending aorta dilatation Geoffrey Robinson) Geoffrey Robinson will continue to follow-up with his cardiologist.  8. Vitamin D deficiency We will check labs today, we will follow-up at Geoffrey Robinson next visit.  - VITAMIN D 25 Hydroxy (Vit-D Deficiency, Fractures)  9. Prediabetes We will check labs today, and we will follow-up at Geoffrey Robinson next visit.  - Insulin, random - Hemoglobin A1c  10. Depression screening Geoffrey Robinson had a positive depression screening. Depression is commonly associated with obesity and often results in emotional eating behaviors. We will monitor this closely and work on CBT to help improve the non-hunger eating patterns. Referral to Psychology may be required if no improvement is seen as he continues in our clinic.  11. Class 3 severe obesity with serious comorbidity and body mass index (BMI) of 45.0 to 49.9 in adult, unspecified obesity type Geoffrey Robinson) Geoffrey Robinson is currently in the action stage of change and his goal is to continue with weight loss efforts. I recommend [name] begin the structured treatment plan as follows:  He has agreed to the Category 4 Plan.  Meal planning was discussed.  Review labs with the patient from 12/17/2021, BMP, CBC, and glucose.  Exercise goals: No exercise has been prescribed at this time. (Due to knee pain).  Behavioral modification strategies: increasing lean protein intake, decreasing simple carbohydrates, increasing vegetables, increasing water intake, decreasing eating out, no skipping meals, meal planning and cooking  strategies, keeping healthy foods in the home, and planning for success.  He was informed of the importance of frequent follow-up visits to maximize his success with intensive lifestyle modifications for his multiple health conditions. He was informed we would discuss his lab results at his next visit unless there is a critical issue that needs to be addressed sooner. Aikeem agreed to keep his next visit at the agreed upon time to discuss these results.  Objective:   Blood pressure (!) 165/74, pulse (!) 59, temperature 97.6 F (36.4 C), height 6' (1.829 m), weight (!) 339 lb (153.8 kg), SpO2 96 %. Body mass index is 45.98 kg/m.  EKG: Normal sinus rhythm, rate 52 BPM.  Indirect Calorimeter completed today shows a VO2 of 460 and a REE of 3182.  His calculated basal metabolic rate is 5621 thus his basal metabolic rate is better than expected.  General: Cooperative, alert, well developed, in no acute distress. HEENT: Conjunctivae and lids unremarkable. Cardiovascular: Regular rhythm.  Lungs: Normal work of breathing. Neurologic: No focal deficits.   Lab Results  Component Value Date   CREATININE 0.81 12/17/2021   BUN 17 12/17/2021   NA 140 12/17/2021   K 4.1 12/17/2021   CL 101 12/17/2021   CO2 22 12/17/2021   Lab Results  Component Value Date   ALT 39 06/12/2019   AST 31 06/12/2019   ALKPHOS 70 06/12/2019  BILITOT 0.9 06/12/2019   Lab Results  Component Value Date   HGBA1C 7.3 (H) 01/14/2022   HGBA1C  04/10/2009    5.9 (NOTE) The ADA recommends the following therapeutic goal for glycemic control related to Hgb A1c measurement: Goal of therapy: <6.5 Hgb A1c  Reference: American Diabetes Association: Clinical Practice Recommendations 2010, Diabetes Care, 2010, 33: (Suppl  1).   Lab Results  Component Value Date   INSULIN 27.1 (H) 01/14/2022   Lab Results  Component Value Date   TSH 0.488 01/14/2022   Lab Results  Component Value Date   CHOL 165 01/14/2022   HDL 32  (L) 01/14/2022   LDLCALC 109 (H) 01/14/2022   TRIG 134 01/14/2022   CHOLHDL 4.6 04/11/2009   Lab Results  Component Value Date   WBC 5.5 12/17/2021   HGB 13.4 12/17/2021   HCT 40.4 12/17/2021   MCV 84 12/17/2021   PLT 219 12/17/2021   No results found for: "IRON", "TIBC", "FERRITIN"  Attestation Statements:   Reviewed by clinician on day of visit: allergies, medications, problem list, medical history, surgical history, family history, social history, and previous encounter notes.   Trude Mcburney, am acting as Energy manager for [employer], DO.  I have reviewed the above documentation for accuracy and completeness, and I agree with the above. Corinna Capra, DO

## 2022-01-27 DIAGNOSIS — E1169 Type 2 diabetes mellitus with other specified complication: Secondary | ICD-10-CM | POA: Insufficient documentation

## 2022-01-28 ENCOUNTER — Encounter (INDEPENDENT_AMBULATORY_CARE_PROVIDER_SITE_OTHER): Payer: Self-pay

## 2022-01-28 ENCOUNTER — Ambulatory Visit (INDEPENDENT_AMBULATORY_CARE_PROVIDER_SITE_OTHER): Payer: BC Managed Care – PPO | Admitting: Bariatrics

## 2022-01-28 ENCOUNTER — Encounter (INDEPENDENT_AMBULATORY_CARE_PROVIDER_SITE_OTHER): Payer: Self-pay | Admitting: Bariatrics

## 2022-01-28 VITALS — BP 126/70 | HR 51 | Temp 97.5°F | Ht 72.0 in | Wt 325.0 lb

## 2022-01-28 DIAGNOSIS — Z7985 Long-term (current) use of injectable non-insulin antidiabetic drugs: Secondary | ICD-10-CM

## 2022-01-28 DIAGNOSIS — E669 Obesity, unspecified: Secondary | ICD-10-CM | POA: Diagnosis not present

## 2022-01-28 DIAGNOSIS — E1169 Type 2 diabetes mellitus with other specified complication: Secondary | ICD-10-CM

## 2022-01-28 DIAGNOSIS — E66813 Obesity, class 3: Secondary | ICD-10-CM

## 2022-01-28 DIAGNOSIS — Z6841 Body Mass Index (BMI) 40.0 and over, adult: Secondary | ICD-10-CM

## 2022-01-28 DIAGNOSIS — R748 Abnormal levels of other serum enzymes: Secondary | ICD-10-CM

## 2022-01-28 DIAGNOSIS — E559 Vitamin D deficiency, unspecified: Secondary | ICD-10-CM

## 2022-01-28 MED ORDER — OZEMPIC (0.25 OR 0.5 MG/DOSE) 2 MG/3ML ~~LOC~~ SOPN: PEN_INJECTOR | SUBCUTANEOUS | 0 refills | Status: DC

## 2022-01-28 MED ORDER — VITAMIN D (ERGOCALCIFEROL) 1.25 MG (50000 UNIT) PO CAPS: 50000.0000 [IU] | ORAL_CAPSULE | ORAL | 0 refills | Status: DC

## 2022-02-02 ENCOUNTER — Other Ambulatory Visit (HOSPITAL_COMMUNITY): Payer: Self-pay | Admitting: Cardiology

## 2022-02-02 ENCOUNTER — Encounter (INDEPENDENT_AMBULATORY_CARE_PROVIDER_SITE_OTHER): Payer: Self-pay

## 2022-02-02 ENCOUNTER — Telehealth (INDEPENDENT_AMBULATORY_CARE_PROVIDER_SITE_OTHER): Payer: Self-pay | Admitting: Bariatrics

## 2022-02-02 ENCOUNTER — Other Ambulatory Visit: Payer: Self-pay

## 2022-02-02 NOTE — Telephone Encounter (Signed)
Dr. Manson Passey - Prior authorization approved for Ozempic. Effective: 01/29/2022 - 01/29/2025. Patient sent approval message via mychart.

## 2022-02-03 ENCOUNTER — Other Ambulatory Visit: Payer: Self-pay | Admitting: Cardiology

## 2022-02-03 DIAGNOSIS — R748 Abnormal levels of other serum enzymes: Secondary | ICD-10-CM | POA: Insufficient documentation

## 2022-02-03 NOTE — Progress Notes (Signed)
Chief Complaint:   OBESITY Geoffrey Robinson is here to discuss his progress with his obesity treatment plan along with follow-up of his obesity related diagnoses. Geoffrey Robinson is on the Category 4 Plan and states he is following his eating plan approximately 100% of the time. Geoffrey Robinson states he is not currently exercising.  Today's visit was #: 2 Starting weight: 339 lbs Starting date: 01/14/22 Today's weight: 325 lbs Today's date: 01/28/22 Total lbs lost to date: 14 Total lbs lost since last in-office visit: 14  Interim History: He is down 14 pounds since his last visit.  He had some changes to make. He was hungry occasionally.  Subjective:   1. Diabetes mellitus type 2 in obese (HCC) A1c is 7.3. Insulin is 27.1. No contraindications to GLP-1.  2. Vitamin D insufficiency Has sunlight exposure.  3. Low serum HDL Taking Tricor. Assessment/Plan:   1. Diabetes mellitus type 2 in obese (HCC) Prescription- - Semaglutide,0.25 or 0.5MG /DOS, (OZEMPIC, 0.25 OR 0.5 MG/DOSE,) 2 MG/3ML SOPN; Inject 0.25 mg into the skin weekly  Dispense: 3 mL; Refill: 0  2. Vitamin D insufficiency Prescription- - Vitamin D, Ergocalciferol, (DRISDOL) 1.25 MG (50000 UNIT) CAPS capsule; Take 1 capsule (50,000 Units total) by mouth every 7 (seven) days.  Dispense: 5 capsule; Refill: 0  3. Low serum HDL 1. Keep carbohydrates low.  4. Obesity, Current BMI 44.2 1.  Meal planning 2.  Intentional eating  3.  Reviewed labs 01/14/2022-lipids, vitamin D, A1c, insulin, thyroid panel. 4.  Dining out sheet   Geoffrey Robinson is currently in the action stage of change. As such, his goal is to continue with weight loss efforts. He has agreed to the Category 4 Plan.   Exercise goals: No exercise has been prescribed at this time.  Behavioral modification strategies: increasing lean protein intake, decreasing simple carbohydrates, increasing vegetables, increasing water intake, decreasing eating out, no skipping meals, meal planning  and cooking strategies, keeping healthy foods in the home, and planning for success.  Geoffrey Robinson has agreed to follow-up with our clinic in 2-3 weeks. He was informed of the importance of frequent follow-up visits to maximize his success with intensive lifestyle modifications for his multiple health conditions.    Objective:   Blood pressure 126/70, pulse (!) 51, temperature (!) 97.5 F (36.4 C), height 6' (1.829 m), weight (!) 325 lb (147.4 kg), SpO2 96 %. Body mass index is 44.08 kg/m.  General: Cooperative, alert, well developed, in no acute distress. HEENT: Conjunctivae and lids unremarkable. Cardiovascular: Regular rhythm.  Lungs: Normal work of breathing. Neurologic: No focal deficits.   Lab Results  Component Value Date   CREATININE 0.81 12/17/2021   BUN 17 12/17/2021   NA 140 12/17/2021   K 4.1 12/17/2021   CL 101 12/17/2021   CO2 22 12/17/2021   Lab Results  Component Value Date   ALT 39 06/12/2019   AST 31 06/12/2019   ALKPHOS 70 06/12/2019   BILITOT 0.9 06/12/2019   Lab Results  Component Value Date   HGBA1C 7.3 (H) 01/14/2022   HGBA1C  04/10/2009    5.9 (NOTE) The ADA recommends the following therapeutic goal for glycemic control related to Hgb A1c measurement: Goal of therapy: <6.5 Hgb A1c  Reference: American Diabetes Association: Clinical Practice Recommendations 2010, Diabetes Care, 2010, 33: (Suppl  1).   Lab Results  Component Value Date   INSULIN 27.1 (H) 01/14/2022   Lab Results  Component Value Date   TSH 0.488 01/14/2022   Lab Results  Component Value Date   CHOL 165 01/14/2022   HDL 32 (L) 01/14/2022   LDLCALC 109 (H) 01/14/2022   TRIG 134 01/14/2022   CHOLHDL 4.6 04/11/2009   Lab Results  Component Value Date   VD25OH 26.4 (L) 01/14/2022   Lab Results  Component Value Date   WBC 5.5 12/17/2021   HGB 13.4 12/17/2021   HCT 40.4 12/17/2021   MCV 84 12/17/2021   PLT 219 12/17/2021   No results found for: "IRON", "TIBC",  "FERRITIN"   Attestation Statements:   Reviewed by clinician on day of visit: allergies, medications, problem list, medical history, surgical history, family history, social history, and previous encounter notes.  I, Dawn Whitmire, FNP-C, am acting as transcriptionist for Dr. Corinna Capra.  I have reviewed the above documentation for accuracy and completeness, and I agree with the above. Corinna Capra, DO

## 2022-02-09 ENCOUNTER — Encounter (INDEPENDENT_AMBULATORY_CARE_PROVIDER_SITE_OTHER): Payer: Self-pay | Admitting: Bariatrics

## 2022-02-26 ENCOUNTER — Ambulatory Visit (INDEPENDENT_AMBULATORY_CARE_PROVIDER_SITE_OTHER): Payer: BC Managed Care – PPO | Admitting: Nurse Practitioner

## 2022-02-26 ENCOUNTER — Encounter (INDEPENDENT_AMBULATORY_CARE_PROVIDER_SITE_OTHER): Payer: Self-pay | Admitting: Nurse Practitioner

## 2022-02-26 VITALS — BP 126/68 | HR 58 | Temp 98.1°F | Ht 72.0 in | Wt 320.0 lb

## 2022-02-26 DIAGNOSIS — E1169 Type 2 diabetes mellitus with other specified complication: Secondary | ICD-10-CM

## 2022-02-26 DIAGNOSIS — Z7985 Long-term (current) use of injectable non-insulin antidiabetic drugs: Secondary | ICD-10-CM | POA: Diagnosis not present

## 2022-02-26 DIAGNOSIS — Z6841 Body Mass Index (BMI) 40.0 and over, adult: Secondary | ICD-10-CM

## 2022-02-26 DIAGNOSIS — E669 Obesity, unspecified: Secondary | ICD-10-CM | POA: Diagnosis not present

## 2022-03-02 NOTE — Progress Notes (Unsigned)
Chief Complaint:   OBESITY Geoffrey Robinson is here to discuss his progress with his obesity treatment plan along with follow-up of his obesity related diagnoses. Geoffrey Robinson is on the Category 4 Plan and states he is following his eating plan approximately 70% of the time. Geoffrey Robinson states he is exercising 0 minutes 0 times per week.  Today's visit was #: 3 Starting weight: 339 lbs Starting date: 01/14/2022 Today's weight: 320 lbs Today's date: 02/26/2022 Total lbs lost to date: 19 lbs Total lbs lost since last in-office visit: 5  Interim History: Geoffrey Robinson has done well with weight loss since his last visit. Went on vacation since his last visit. Feels meeting protein goals. Not skipping meals. Notes decreased hunger since starting Ozempic. Reports he feels good. Drinks diet soda, water and Fairlife milk. Goal is to lose weight to have knee surgery. Going to the beach in October.  Subjective:   1. Diabetes mellitus type 2 in obese [facility]) Geoffrey Robinson is taking Ozempic 0.25 mg times 2 weeks. Occasional feels weak if skips meals. Feels better after eating. On ACE, but not a statin.  Was on statin in the past and was stopped by cardiology. Last eye exam 2023. Notes hunger and appetite has decreased since starting Ozempic.  Assessment/Plan:   1. Diabetes mellitus type 2 in obese (HCC) Continue Ozempic 0.25 mg. Side effects discussed.   Good blood sugar control is important to decrease the likelihood of diabetic complications such as nephropathy, neuropathy, limb loss, blindness, coronary artery disease, and death. Intensive lifestyle modification including diet, exercise and weight loss are the first line of treatment for diabetes.    2. Obesity, Current BMI 43.5 Geoffrey Robinson is currently in the action stage of change. As such, his goal is to continue with weight loss efforts. He has agreed to the Category 4 Plan.   Exercise goals: All adults should avoid inactivity. Some physical activity is better than none,  and adults who participate in any amount of physical activity gain some health benefits.  Behavioral modification strategies: increasing lean protein intake, increasing vegetables, and increasing water intake.  Geoffrey Robinson has agreed to follow-up with our clinic in 2 weeks. He was informed of the importance of frequent follow-up visits to maximize his success with intensive lifestyle modifications for his multiple health conditions.   Objective:   Blood pressure 126/68, pulse (!) 58, temperature 98.1 F (36.7 C), height 6' (1.829 m), weight (!) 320 lb (145.2 kg), SpO2 100 %. Body mass index is 43.4 kg/m.  General: Cooperative, alert, well developed, in no acute distress. HEENT: Conjunctivae and lids unremarkable. Cardiovascular: Regular rhythm.  Lungs: Normal work of breathing. Neurologic: No focal deficits.   Lab Results  Component Value Date   CREATININE 0.81 12/17/2021   BUN 17 12/17/2021   NA 140 12/17/2021   K 4.1 12/17/2021   CL 101 12/17/2021   CO2 22 12/17/2021   Lab Results  Component Value Date   ALT 39 06/12/2019   AST 31 06/12/2019   ALKPHOS 70 06/12/2019   BILITOT 0.9 06/12/2019   Lab Results  Component Value Date   HGBA1C 7.3 (H) 01/14/2022   HGBA1C  04/10/2009    5.9 (NOTE) The ADA recommends the following therapeutic goal for glycemic control related to Hgb A1c measurement: Goal of therapy: <6.5 Hgb A1c  Reference: American Diabetes Association: Clinical Practice Recommendations 2010, Diabetes Care, 2010, 33: (Suppl  1).   Lab Results  Component Value Date   INSULIN 27.1 (H) 01/14/2022  Lab Results  Component Value Date   TSH 0.488 01/14/2022   Lab Results  Component Value Date   CHOL 165 01/14/2022   HDL 32 (L) 01/14/2022   LDLCALC 109 (H) 01/14/2022   TRIG 134 01/14/2022   CHOLHDL 4.6 04/11/2009   Lab Results  Component Value Date   VD25OH 26.4 (L) 01/14/2022   Lab Results  Component Value Date   WBC 5.5 12/17/2021   HGB 13.4 12/17/2021    HCT 40.4 12/17/2021   MCV 84 12/17/2021   PLT 219 12/17/2021   No results found for: "IRON", "TIBC", "FERRITIN"  Attestation Statements:   Reviewed by clinician on day of visit: allergies, medications, problem list, medical history, surgical history, family history, social history, and previous encounter notes.  Time spent on visit including pre-visit chart review and post-visit care and charting was 30 minutes.   I, Brendell Tyus, RMA, am acting as transcriptionist for Irene Limbo, FNP.  I have reviewed the above documentation for accuracy and completeness, and I agree with the above. Irene Limbo, FNP

## 2022-03-16 ENCOUNTER — Ambulatory Visit: Payer: BC Managed Care – PPO | Admitting: Nurse Practitioner

## 2022-03-16 ENCOUNTER — Encounter: Payer: Self-pay | Admitting: Nurse Practitioner

## 2022-03-16 VITALS — BP 146/81 | HR 59 | Temp 98.0°F | Ht 72.0 in | Wt 323.0 lb

## 2022-03-16 DIAGNOSIS — Z6841 Body Mass Index (BMI) 40.0 and over, adult: Secondary | ICD-10-CM

## 2022-03-16 DIAGNOSIS — I10 Essential (primary) hypertension: Secondary | ICD-10-CM

## 2022-03-16 DIAGNOSIS — E669 Obesity, unspecified: Secondary | ICD-10-CM

## 2022-03-16 DIAGNOSIS — E559 Vitamin D deficiency, unspecified: Secondary | ICD-10-CM

## 2022-03-16 DIAGNOSIS — E1169 Type 2 diabetes mellitus with other specified complication: Secondary | ICD-10-CM

## 2022-03-16 DIAGNOSIS — Z7985 Long-term (current) use of injectable non-insulin antidiabetic drugs: Secondary | ICD-10-CM

## 2022-03-16 MED ORDER — VITAMIN D (ERGOCALCIFEROL) 1.25 MG (50000 UNIT) PO CAPS: 50000.0000 [IU] | ORAL_CAPSULE | ORAL | 0 refills | Status: DC

## 2022-03-16 MED ORDER — OZEMPIC (0.25 OR 0.5 MG/DOSE) 2 MG/3ML ~~LOC~~ SOPN: PEN_INJECTOR | SUBCUTANEOUS | 0 refills | Status: DC

## 2022-03-18 NOTE — Progress Notes (Unsigned)
Chief Complaint:   OBESITY Geoffrey Robinson is here to discuss his progress with his obesity treatment plan along with follow-up of his obesity related diagnoses. Geoffrey Robinson is on the Category 4 Plan and states he is following his eating plan approximately 50% of the time. Geoffrey Robinson states he is exercising 0 minutes 0 times per week.  Today's visit was #: 4 Starting weight: 339 lbs Starting date: 01/14/2022 Today's weight: 323 lbs Today's date: 03/16/2022 Total lbs lost to date: 16 lbs Total lbs lost since last in-office visit: 0  Interim History: Geoffrey Robinson has been eating more and has not been making the best choices. He has been going to more functions. He is eating 3 meals and 2 snacks per day. Snacks: Peanut butter. Trying to limit candy and cookies. Eating more cheese and fruit. Drinking water, Fairlife milk and 2 diet sodas.   Subjective:   1. Diabetes mellitus type 2 in obese [facility]) Geoffrey Robinson is taking Ozempic 0.25 mg. Had one episode of feeling a little weak, denies dizziness. Like a flush lasting for a few seconds. Checked blood sugar once since his last visit. Fasting blood sugar are 133. On ACE was on statin in the past. Last A1c at 7.3.   2. Vitamin D insufficiency Geoffrey Robinson is currently taking prescription Vit D 50,000 IU once a week.   3. Hypertension, unspecified type Geoffrey Robinson has a history of Afib. Saw cardio last on 12/17/21. Denies any chest pain,shortness of breath or palpitations.  Assessment/Plan:   1. Diabetes mellitus type 2 in obese ([place]) Continue Ozempic, we will not increase at this time. Check *** and blood sugar a couple days a week.We will refill Ozempic 0.25 mg weekly for 1 month with 0 refills.  -Refill Semaglutide,0.25 or 0.5MG /DOS, (OZEMPIC, 0.25 OR 0.5 MG/DOSE,) 2 MG/3ML SOPN; Inject 0.25 mg into the skin weekly  Dispense: 3 mL; Refill: 0  2. Vitamin D insufficiency We will refill Vit D 50,000 IU once a week for 1 month with 0 refills. Low Vitamin D level  contributes to fatigue and are associated with obesity, breast, and colon cancer. He agrees to continue to take prescription Vitamin D @50 ,000 IU every week and will follow-up for routine testing of Vitamin D, at least 2-3 times per year to avoid over-replacement.   -Refill Vitamin D, Ergocalciferol, (DRISDOL) 1.25 MG (50000 UNIT) CAPS capsule; Take 1 capsule (50,000 Units total) by mouth every 7 (seven) days.  Dispense: 5 capsule; Refill: 0  3. Hypertension, unspecified type Keep appointment with Cardio on 06/03/22.  4. Obesity, Current BMI 43.9 Geoffrey Robinson is currently in the action stage of change. As such, his goal is to continue with weight loss efforts. He has agreed to the Category 4 Plan.   Exercise goals: All adults should avoid inactivity. Some physical activity is better than none, and adults who participate in any amount of physical activity gain some health benefits.  Behavioral modification strategies: increasing lean protein intake, increasing vegetables, increasing water intake, and planning for success.  Geoffrey Robinson has agreed to follow-up with our clinic in 2 weeks. He was informed of the importance of frequent follow-up visits to maximize his success with intensive lifestyle modifications for his multiple health conditions.   Objective:   Blood pressure (!) 146/81, pulse (!) 59, temperature 98 F (36.7 C), height 6' (1.829 m), weight (!) 323 lb (146.5 kg), SpO2 97 %. Body mass index is 43.81 kg/m.  General: Cooperative, alert, well developed, in no acute distress. HEENT: Conjunctivae and lids  unremarkable. Cardiovascular: Regular rhythm.  Lungs: Normal work of breathing. Neurologic: No focal deficits.   Lab Results  Component Value Date   CREATININE 0.81 12/17/2021   BUN 17 12/17/2021   NA 140 12/17/2021   K 4.1 12/17/2021   CL 101 12/17/2021   CO2 22 12/17/2021   Lab Results  Component Value Date   ALT 39 06/12/2019   AST 31 06/12/2019   ALKPHOS 70 06/12/2019    BILITOT 0.9 06/12/2019   Lab Results  Component Value Date   HGBA1C 7.3 (H) 01/14/2022   HGBA1C  04/10/2009    5.9 (NOTE) The ADA recommends the following therapeutic goal for glycemic control related to Hgb A1c measurement: Goal of therapy: <6.5 Hgb A1c  Reference: American Diabetes Association: Clinical Practice Recommendations 2010, Diabetes Care, 2010, 33: (Suppl  1).   Lab Results  Component Value Date   INSULIN 27.1 (H) 01/14/2022   Lab Results  Component Value Date   TSH 0.488 01/14/2022   Lab Results  Component Value Date   CHOL 165 01/14/2022   HDL 32 (L) 01/14/2022   LDLCALC 109 (H) 01/14/2022   TRIG 134 01/14/2022   CHOLHDL 4.6 04/11/2009   Lab Results  Component Value Date   VD25OH 26.4 (L) 01/14/2022   Lab Results  Component Value Date   WBC 5.5 12/17/2021   HGB 13.4 12/17/2021   HCT 40.4 12/17/2021   MCV 84 12/17/2021   PLT 219 12/17/2021   No results found for: "IRON", "TIBC", "FERRITIN"  Attestation Statements:   Reviewed by clinician on day of visit: allergies, medications, problem list, medical history, surgical history, family history, social history, and previous encounter notes.  I, Brendell Tyus, RMA, am acting as transcriptionist for Irene Limbo, FNP.  I have reviewed the above documentation for accuracy and completeness, and I agree with the above. -  ***

## 2022-04-01 ENCOUNTER — Encounter: Payer: Self-pay | Admitting: Nurse Practitioner

## 2022-04-01 ENCOUNTER — Ambulatory Visit: Payer: BC Managed Care – PPO | Admitting: Nurse Practitioner

## 2022-04-01 VITALS — BP 135/77 | HR 56 | Temp 97.8°F | Ht 72.0 in | Wt 317.0 lb

## 2022-04-01 DIAGNOSIS — E1169 Type 2 diabetes mellitus with other specified complication: Secondary | ICD-10-CM | POA: Diagnosis not present

## 2022-04-01 DIAGNOSIS — E669 Obesity, unspecified: Secondary | ICD-10-CM | POA: Diagnosis not present

## 2022-04-01 DIAGNOSIS — Z7985 Long-term (current) use of injectable non-insulin antidiabetic drugs: Secondary | ICD-10-CM

## 2022-04-01 DIAGNOSIS — Z6841 Body Mass Index (BMI) 40.0 and over, adult: Secondary | ICD-10-CM

## 2022-04-01 DIAGNOSIS — I10 Essential (primary) hypertension: Secondary | ICD-10-CM | POA: Diagnosis not present

## 2022-04-06 NOTE — Progress Notes (Unsigned)
Chief Complaint:   OBESITY Geoffrey Robinson is here to discuss his progress with his obesity treatment plan along with follow-up of his obesity related diagnoses. Geoffrey Robinson is on the Category 4 Plan and states he is following his eating plan approximately 50% of the time. [name] states he is exercising 0 minutes 0 times per week.  Today's visit was #: 4 Starting weight: 339 lbs Starting date: 01/14/2022 Today's weight: 317 lbs Today's date: 04/01/2022 Total lbs lost to date: 22 lbs Total lbs lost since last in-office visit: 4  Interim History: Geoffrey Robinson has done well with weight loss since his last visit. He is making healthier choices, watching portion sizes and exercising more. Denies polyphagia but notes occasional cravings. Drinking water, Fairlife milk and 2 diet sodas. He is going on vacation next week. Recently got back from the beach.   Subjective:   1. Diabetes mellitus type 2 in obese [facility]) Geoffrey Robinson is taking Ozempic 0.25 mg. Denies any side effects. He has checked fasting blood sugars twice since last visit --132/144. Denies hypoglycemia. On ACE, was on statin in past. Last eye exam-- 2023.  2. Hypertension, unspecified type Geoffrey Robinson's blood pressure looks better today. Denies any chest pain,shortness of breath or palpitations. Has appointment with Cardio on 06/03/22.  Assessment/Plan:   1. Diabetes mellitus type 2 in obese (HCC) Continue Ozempic 0.25 mg. Side effects discussed.   Good blood sugar control is important to decrease the likelihood of diabetic complications such as nephropathy, neuropathy, limb loss, blindness, coronary artery disease, and death. Intensive lifestyle modification including diet, exercise and weight loss are the first line of treatment for diabetes.    2. Hypertension, unspecified type Continue to follow up with cardio and continue medications as directed.  Geoffrey Robinson is working on healthy weight loss and exercise to improve blood pressure control. We  will watch for signs of hypotension as he continues his lifestyle modifications.   3. Obesity, Current BMI 43.0 [name] is currently in the action stage of change. As such, his goal is to continue with weight loss efforts. He has agreed to the Category 4 Plan.   Will obtain labs and fasting A1c and insulin at next visit.  Exercise goals: All adults should avoid inactivity. Some physical activity is better than none, and adults who participate in any amount of physical activity gain some health benefits.  Encouraged pool therapy.  Behavioral modification strategies: increasing lean protein intake, increasing water intake, and travel eating strategies.  Geoffrey Robinson has agreed to follow-up with our clinic in 4 weeks. He was informed of the importance of frequent follow-up visits to maximize his success with intensive lifestyle modifications for his multiple health conditions.   Objective:   Blood pressure 135/77, pulse (!) 56, temperature 97.8 F (36.6 C), temperature source Oral, height 6' (1.829 m), weight (!) 317 lb (143.8 kg), SpO2 97 %. Body mass index is 42.99 kg/m.  General: Cooperative, alert, well developed, in no acute distress. HEENT: Conjunctivae and lids unremarkable. Cardiovascular: Regular rhythm.  Lungs: Normal work of breathing. Neurologic: No focal deficits.   Lab Results  Component Value Date   CREATININE 0.81 12/17/2021   BUN 17 12/17/2021   NA 140 12/17/2021   K 4.1 12/17/2021   CL 101 12/17/2021   CO2 22 12/17/2021   Lab Results  Component Value Date   ALT 39 06/12/2019   AST 31 06/12/2019   ALKPHOS 70 06/12/2019   BILITOT 0.9 06/12/2019   Lab Results  Component Value Date  HGBA1C 7.3 (H) 01/14/2022   HGBA1C  04/10/2009    5.9 (NOTE) The ADA recommends the following therapeutic goal for glycemic control related to Hgb A1c measurement: Goal of therapy: <6.5 Hgb A1c  Reference: American Diabetes Association: Clinical Practice Recommendations 2010,  Diabetes Care, 2010, 33: (Suppl  1).   Lab Results  Component Value Date   INSULIN 27.1 (H) 01/14/2022   Lab Results  Component Value Date   TSH 0.488 01/14/2022   Lab Results  Component Value Date   CHOL 165 01/14/2022   HDL 32 (L) 01/14/2022   LDLCALC 109 (H) 01/14/2022   TRIG 134 01/14/2022   CHOLHDL 4.6 04/11/2009   Lab Results  Component Value Date   VD25OH 26.4 (L) 01/14/2022   Lab Results  Component Value Date   WBC 5.5 12/17/2021   HGB 13.4 12/17/2021   HCT 40.4 12/17/2021   MCV 84 12/17/2021   PLT 219 12/17/2021   No results found for: "IRON", "TIBC", "FERRITIN"  Attestation Statements:   Reviewed by clinician on day of visit: allergies, medications, problem list, medical history, surgical history, family history, social history, and previous encounter notes.  I spent 30 minutes with the patient and reviewing his chart before and after his visit.be  I, Brendell Tyus, RMA, am acting as transcriptionist for Everardo Pacific, FNP.  I have reviewed the above documentation for accuracy and completeness, and I agree with the above. Everardo Pacific, FNP

## 2022-04-29 ENCOUNTER — Ambulatory Visit: Payer: BC Managed Care – PPO | Admitting: Nurse Practitioner

## 2022-04-29 VITALS — BP 147/77 | HR 59 | Temp 97.8°F | Ht 72.0 in | Wt 316.0 lb

## 2022-04-29 DIAGNOSIS — I10 Essential (primary) hypertension: Secondary | ICD-10-CM

## 2022-04-29 DIAGNOSIS — E559 Vitamin D deficiency, unspecified: Secondary | ICD-10-CM

## 2022-04-29 DIAGNOSIS — E669 Obesity, unspecified: Secondary | ICD-10-CM

## 2022-04-29 DIAGNOSIS — Z6841 Body Mass Index (BMI) 40.0 and over, adult: Secondary | ICD-10-CM

## 2022-04-29 DIAGNOSIS — Z7985 Long-term (current) use of injectable non-insulin antidiabetic drugs: Secondary | ICD-10-CM

## 2022-04-29 DIAGNOSIS — E1169 Type 2 diabetes mellitus with other specified complication: Secondary | ICD-10-CM

## 2022-04-29 MED ORDER — OZEMPIC (0.25 OR 0.5 MG/DOSE) 2 MG/3ML ~~LOC~~ SOPN: PEN_INJECTOR | SUBCUTANEOUS | 0 refills | Status: DC

## 2022-04-29 MED ORDER — VITAMIN D (ERGOCALCIFEROL) 1.25 MG (50000 UNIT) PO CAPS: 50000.0000 [IU] | ORAL_CAPSULE | ORAL | 0 refills | Status: DC

## 2022-05-05 NOTE — Progress Notes (Signed)
Chief Complaint:   OBESITY Geoffrey Robinson is here to discuss his progress with his obesity treatment plan along with follow-up of his obesity related diagnoses. Geoffrey Robinson is on the Category 4 Plan and states he is following his eating plan approximately 50% of the time. Geoffrey Robinson states he is exercising 0 minutes 0 times per week.  Today's visit was #: 5 Starting weight: 339 lbs Starting date: 01/14/2022 Today's weight: 316 lbs Today's date: 04/29/2022 Total lbs lost to date: 23 lbs Total lbs lost since last in-office visit: 1  Interim History: Geoffrey Robinson has been on vacation since his last visit. Has overall had done well with his weight loss. Not skipping meals. Notes some occasional cravings and hunger. Drinking water, protein shakes, diet sodas and coffee. His wife is having surgery prior to his next visit.  Subjective:   1. Hypertension, unspecified type Geoffrey Robinson is taking Lopressor 100 mg 1 am and 1.5 in the evenings, Lisinopril 40 mg, lasix 20 mg as needed, Cardizem CD 360 mg; Amiodarone 200 mg. Denies any chest pain,shortness of breath or palpitations.  2. Diabetes mellitus type 2 in obese [facility]) Geoffrey Robinson is taking Ozempic 0.25 mg. Denies any side effects. Fasting blood sugars=125-130. Denies hypoglycemia.  3. Vitamin D insufficiency Geoffrey Robinson is currently taking prescription Vit D 50,000 IU once a week. Denies any nausea, vomiting or muscle weakness.  Assessment/Plan:   1. Hypertension, unspecified type Keep follow up appointment with cardio on 06/03/22.  [name] is working on healthy weight loss and exercise to improve blood pressure control. We will watch for signs of hypotension as he continues his lifestyle modifications.   2. Diabetes mellitus type 2 in obese (HCC) We will refill/increase Ozempic to 0.5 mg once a week.  Side effects discussed.   Good blood sugar control is important to decrease the likelihood of diabetic complications such as nephropathy, neuropathy, limb loss,  blindness, coronary artery disease, and death. Intensive lifestyle modification including diet, exercise and weight loss are the first line of treatment for diabetes.    -Refill/Increase Semaglutide,0.25 or 0.5MG /DOS, (OZEMPIC, 0.25 OR 0.5 MG/DOSE,) 2 MG/3ML SOPN; Inject 0.5 mg into the skin weekly  Dispense: 3 mL; Refill: 0  3. Vitamin D insufficiency We will refill Vit D 50,000 IU once a week for 1 month with 0 refills. Low Vitamin D level contributes to fatigue and are associated with obesity, breast, and colon cancer. He agrees to continue to take prescription Vitamin D @50 ,000 IU every week and will follow-up for routine testing of Vitamin D, at least 2-3 times per year to avoid over-replacement.   -Refill Vitamin D, Ergocalciferol, (DRISDOL) 1.25 MG (50000 UNIT) CAPS capsule; Take 1 capsule (50,000 Units total) by mouth every 7 (seven) days.  Dispense: 5 capsule; Refill: 0  4. Obesity, Current BMI 42.9 Geoffrey Robinson is currently in the action stage of change. As such, his goal is to continue with weight loss efforts. He has agreed to the Category 4 Plan.   Exercise goals: All adults should avoid inactivity. Some physical activity is better than none, and adults who participate in any amount of physical activity gain some health benefits.  Behavioral modification strategies: increasing water intake, no skipping meals, meal planning and cooking strategies, and holiday eating strategies .  Geoffrey Robinson has agreed to follow-up with our clinic in 4 weeks. He was informed of the importance of frequent follow-up visits to maximize his success with intensive lifestyle modifications for his multiple health conditions.   Objective:   Blood pressure Geoffrey Robinson)  147/77, pulse (!) 59, temperature 97.8 F (36.6 C), temperature source Temporal, height 6' (1.829 m), weight (!) 316 lb (143.3 kg), SpO2 96 %. Body mass index is 42.86 kg/m.  General: Cooperative, alert, well developed, in no acute distress. HEENT:  Conjunctivae and lids unremarkable. Cardiovascular: Regular rhythm.  Lungs: Normal work of breathing. Neurologic: No focal deficits.   Lab Results  Component Value Date   CREATININE 0.81 12/17/2021   BUN 17 12/17/2021   NA 140 12/17/2021   K 4.1 12/17/2021   CL 101 12/17/2021   CO2 22 12/17/2021   Lab Results  Component Value Date   ALT 39 06/12/2019   AST 31 06/12/2019   ALKPHOS 70 06/12/2019   BILITOT 0.9 06/12/2019   Lab Results  Component Value Date   HGBA1C 7.3 (H) 01/14/2022   HGBA1C  04/10/2009    5.9 (NOTE) The ADA recommends the following therapeutic goal for glycemic control related to Hgb A1c measurement: Goal of therapy: <6.5 Hgb A1c  Reference: American Diabetes Association: Clinical Practice Recommendations 2010, Diabetes Care, 2010, 33: (Suppl  1).   Lab Results  Component Value Date   INSULIN 27.1 (H) 01/14/2022   Lab Results  Component Value Date   TSH 0.488 01/14/2022   Lab Results  Component Value Date   CHOL 165 01/14/2022   HDL 32 (L) 01/14/2022   LDLCALC 109 (H) 01/14/2022   TRIG 134 01/14/2022   CHOLHDL 4.6 04/11/2009   Lab Results  Component Value Date   VD25OH 26.4 (L) 01/14/2022   Lab Results  Component Value Date   WBC 5.5 12/17/2021   HGB 13.4 12/17/2021   HCT 40.4 12/17/2021   MCV 84 12/17/2021   PLT 219 12/17/2021   No results found for: "IRON", "TIBC", "FERRITIN"  Attestation Statements:   Reviewed by clinician on day of visit: allergies, medications, problem list, medical history, surgical history, family history, social history, and previous encounter notes.  I, Brendell Tyus, RMA, am acting as transcriptionist for Irene Limbo, FNP.  I have reviewed the above documentation for accuracy and completeness, and I agree with the above. Irene Limbo, FNP

## 2022-06-03 ENCOUNTER — Ambulatory Visit: Payer: BC Managed Care – PPO | Attending: Cardiology | Admitting: Cardiology

## 2022-06-03 ENCOUNTER — Encounter: Payer: Self-pay | Admitting: Cardiology

## 2022-06-03 ENCOUNTER — Ambulatory Visit: Payer: BC Managed Care – PPO | Admitting: Nurse Practitioner

## 2022-06-03 VITALS — BP 148/82 | HR 59 | Ht 72.0 in | Wt 323.4 lb

## 2022-06-03 DIAGNOSIS — G4733 Obstructive sleep apnea (adult) (pediatric): Secondary | ICD-10-CM

## 2022-06-03 DIAGNOSIS — I428 Other cardiomyopathies: Secondary | ICD-10-CM

## 2022-06-03 DIAGNOSIS — I77810 Thoracic aortic ectasia: Secondary | ICD-10-CM

## 2022-06-03 DIAGNOSIS — I480 Paroxysmal atrial fibrillation: Secondary | ICD-10-CM

## 2022-06-03 DIAGNOSIS — I10 Essential (primary) hypertension: Secondary | ICD-10-CM | POA: Diagnosis not present

## 2022-06-03 NOTE — Progress Notes (Signed)
Date:  06/03/2022   ID:  Geoffrey Robinson, DOB [birth year], MRN [id number]   PCP:  [name], MD  Cardiologist:  [name], MD Electrophysiologist:  None   Chief Complaint:  OSA, HTN, Afib  History of Present Illness:    Geoffrey Robinson is a 61 y.o. male with a hx of severe OSA on CPAP, obesity, HTN and persistent atrial fibrillation on chronic anticoagulation.   He is here today for followup and is doing well.  He denies any chest pain or pressure, SOB, DOE, PND, orthopnea, LE edema, dizziness, palpitations or syncope. He is compliant with his meds and is tolerating meds with no SE.    He is doing well with his PAP device and thinks that he has gotten used to it.  He tolerates the mask and feels the pressure is adequate.  Since going on PAP he feels rested in the am and has no significant daytime sleepiness.  He denies any significant mouth or nasal dryness or nasal congestion.  He does not think that he snores.   This Prior CV studies:   The following studies were reviewed today:  PAP compliance download, labs from PCP  Past Medical History:  Diagnosis Date   Ascending aorta dilatation (HCC)    20mm by echo 11/2020   Back pain    Bradycardia 10/21/2015   Dyslipidemia    Heart burn    High cholesterol    HTN (hypertension)    Joint pain    NICM (nonischemic cardiomyopathy) (HCC)    a. LHC 2002: no CAD, EF 35-40%; b. Echo 2003: EF 60% - tachycardia mediated (AFlutter with RVR).  EF 50% on echo 11/2020   Obesity    Palpitation    Persistent atrial fibrillation (HCC)    CHADS2VASC score is 2.  s/p remote afib and aflutter ablations   Pre-diabetes    Severe obstructive sleep apnea    , CPAP at 14cm H2O   Swelling of lower extremity    Varicose veins    Past Surgical History:  Procedure Laterality Date   CARDIAC SURGERY     CARDIOVERSION N/A 09/07/2019   Procedure: CARDIOVERSION;  Surgeon: Parke Poisson, MD;  Location: [facility] ENDOSCOPY;  Service: Cardiovascular;   Laterality: N/A;   COLONOSCOPY       Current Meds  Medication Sig   acetaminophen (TYLENOL) 500 MG tablet Take 1,000 mg by mouth every 6 (six) hours as needed for moderate pain.   amiodarone (PACERONE) 200 MG tablet Take one tablet by mouth daily Monday through Saturday.  Take 2 tablets by mouth on Sunday.   betamethasone dipropionate 0.05 % cream Apply topically 2 (two) times daily as needed.   diltiazem (CARDIZEM CD) 360 MG 24 hr capsule Take 1 capsule (360 mg total) by mouth daily.   fenofibrate (TRICOR) 145 MG tablet Take 145 mg by mouth daily.   furosemide (LASIX) 20 MG tablet TAKE 1 TABLET AS NEEDED FOR EXCESS SWELLING/WEIGHT GAIN 2-3LBS PER DAY OR 5LBS PER WEEK   ketoconazole (NIZORAL) 2 % cream Apply 1 application topically daily.   lisinopril (ZESTRIL) 40 MG tablet Take 1 tablet (40 mg total) by mouth daily.   metoprolol tartrate (LOPRESSOR) 100 MG tablet TAKE 1 TABLET BY MOUTH EVERY MORNING AND 1 AND 1/2 TABLETS EVERY EVENING.   omeprazole (PRILOSEC) 40 MG capsule Take 40 mg by mouth daily.    rivaroxaban (XARELTO) 20 MG TABS tablet TAKE ONE TABLET BY MOUTH ONE TIME DAILY WITH SUPPER  Semaglutide,0.25 or 0.5MG /DOS, (OZEMPIC, 0.25 OR 0.5 MG/DOSE,) 2 MG/3ML SOPN Inject 0.5 mg into the skin weekly   tamsulosin (FLOMAX) 0.4 MG CAPS capsule Take 0.4 mg by mouth at bedtime.   Vitamin D, Ergocalciferol, (DRISDOL) 1.25 MG (50000 UNIT) CAPS capsule Take 1 capsule (50,000 Units total) by mouth every 7 (seven) days.     Allergies:   Plavix [clopidogrel bisulfate]   Social History   Tobacco Use   Smoking status: Never   Smokeless tobacco: Never  Vaping Use   Vaping Use: Never used  Substance Use Topics   Alcohol use: Yes    Alcohol/week: 3.0 standard drinks of alcohol    Types: 1 Standard drinks or equivalent, 1 Cans of beer, 1 Glasses of wine per week    Comment: occassional beer and wine   Drug use: No     Family Hx: The patient's family history includes Diabetes in his  mother; Heart disease in his father and mother; High Cholesterol in his father and mother; High blood pressure in his mother; Hypertension in his father and mother; Sleep apnea in his mother.  ROS:   Please see the history of present illness.     All other systems reviewed and are negative.   Labs/Other Tests and Data Reviewed:    Recent Labs: 12/17/2021: BUN 17; Creatinine, Ser 0.81; Hemoglobin 13.4; Platelets 219; Potassium 4.1; Sodium 140 01/14/2022: TSH 0.488   Recent Lipid Panel Lab Results  Component Value Date/Time   CHOL 165 01/14/2022 09:02 AM   TRIG 134 01/14/2022 09:02 AM   HDL 32 (L) 01/14/2022 09:02 AM   CHOLHDL 4.6 04/11/2009 04:51 AM   LDLCALC 109 (H) 01/14/2022 09:02 AM    Wt Readings from Last 3 Encounters:  06/03/22 (!) 323 lb 6.4 oz (146.7 kg)  04/29/22 (!) 316 lb (143.3 kg)  04/01/22 (!) 317 lb (143.8 kg)     Objective:    Vital Signs:  BP (!) 148/82   Pulse (!) 59   Ht 6' (1.829 m)   Wt (!) 323 lb 6.4 oz (146.7 kg)   SpO2 98%   BMI 43.86 kg/m   GEN: Well nourished, well developed in no acute distress HEENT: Normal NECK: No JVD; No carotid bruits LYMPHATICS: No lymphadenopathy CARDIAC:RRR, no murmurs, rubs, gallops RESPIRATORY:  Clear to auscultation without rales, wheezing or rhonchi  ABDOMEN: Soft, non-tender, non-distended MUSCULOSKELETAL:  No edema; No deformity  SKIN: Warm and dry NEUROLOGIC:  Alert and oriented x 3 PSYCHIATRIC:  Normal affect  ASSESSMENT & PLAN:    1.  OSA - The patient is tolerating PAP therapy well without any problems. The PAP download performed by his DME was personally reviewed and interpreted by me today and showed an AHI of 3.1/hr on 15 cm H2O with 83% compliance in using more than 4 hours nightly.  The patient has been using and benefiting from PAP use and will continue to benefit from therapy.   2.  Persistent atrial fibrillation/atrial flutter -s/p remote aflutter and afib ablations -he went back into atrial  flutter back in Dec 2020 and was seen in office and Multaq stopped and started on Amio load and then DCCV >>followed in EP clinic with Dr. Ladona Ridgel and he is trying to wean him down on the Amio and get him off of Amio -He is in normal sinus rhythm and denies any palpitations since I saw him last -Continue drug management Cardizem CD 360 mg daily, Pacerone 200 mg daily Monday through Saturday and  400 mg on Sunday, Lopressor 100 mg every morning and 150 mg every afternoon and Xarelto 20 mg daily with as needed refills-he denies any bleeding problems -I have personally reviewed and interpreted outside labs performed by patient's PCP which showed serum creatinine 0.81 and hemoglobin 13.4 on 12/17/2021  3.  HTN -BP borderline controlled on exam today -check BP daily for a week and call with results -Continue drug management Cardizem CD 360 mg daily, Lopressor 100 mg every morning and 150 mg every afternoon and lisinopril 40 mg daily with as needed refills  4.  DCM/Chronic systolic CHF -EF on echo 06/2019 was 35-40% with global HK  -DCM felt tachy mediated and possibly a component of poorly controlled BP -Repeat 2D echo done 12/2021 showed normal LV function with EF 55 to 60% and mild LVH -He appears euvolemic on exam -Continue ACE and beta-blocker therapy  5.  Dilated ascending aorta -8mm by echo 12/2021 -Repeat echo 12/2022    Medication Adjustments/Labs and Tests Ordered: Current medicines are reviewed at length with the patient today.  Concerns regarding medicines are outlined above.  Tests Ordered: No orders of the defined types were placed in this encounter.  Medication Changes: No orders of the defined types were placed in this encounter.   Disposition:  Follow up 6 months  Signed, [name], MD  06/03/2022 8:21 AM     [facility]

## 2022-06-03 NOTE — Patient Instructions (Signed)
Medication Instructions:  Your physician recommends that you continue on your current medications as directed. Please refer to the Current Medication list given to you today.  *If you need a refill on your cardiac medications before your next appointment, please call your pharmacy*   Lab Work: none If you have labs (blood work) drawn today and your tests are completely normal, you will receive your results only by: MyChart Message (if you have MyChart) OR A paper copy in the mail If you have any lab test that is abnormal or we need to change your treatment, we will call you to review the results.   Testing/Procedures: none   Follow-Up: At [facility], you and your health needs are our priority.  As part of our continuing mission to provide you with exceptional heart care, we have created designated Provider Care Teams.  These Care Teams include your primary Cardiologist (physician) and Advanced Practice Providers (APPs -  Physician Assistants and Nurse Practitioners) who all work together to provide you with the care you need, when you need it.  We recommend signing up for the patient portal called "MyChart".  Sign up information is provided on this After Visit Summary.  MyChart is used to connect with patients for Virtual Visits (Telemedicine).  Patients are able to view lab/test results, encounter notes, upcoming appointments, etc.  Non-urgent messages can be sent to your provider as well.   To learn more about what you can do with MyChart, go to [email or web address].    Your next appointment:   12 month(s)  The format for your next appointment:   In Person  Provider:   Dr Mayford Knife    Other Instructions Check blood pressure daily about lunchtime for a week and call readings to office.  Can also send in through my chart  Important Information About Sugar

## 2022-06-30 ENCOUNTER — Encounter: Payer: Self-pay | Admitting: Nurse Practitioner

## 2022-06-30 ENCOUNTER — Ambulatory Visit: Payer: BC Managed Care – PPO | Admitting: Nurse Practitioner

## 2022-06-30 VITALS — BP 152/84 | HR 61 | Temp 98.2°F | Ht 72.0 in | Wt 323.0 lb

## 2022-06-30 DIAGNOSIS — E119 Type 2 diabetes mellitus without complications: Secondary | ICD-10-CM

## 2022-06-30 DIAGNOSIS — Z6841 Body Mass Index (BMI) 40.0 and over, adult: Secondary | ICD-10-CM

## 2022-06-30 DIAGNOSIS — Z7985 Long-term (current) use of injectable non-insulin antidiabetic drugs: Secondary | ICD-10-CM

## 2022-06-30 DIAGNOSIS — E669 Obesity, unspecified: Secondary | ICD-10-CM | POA: Diagnosis not present

## 2022-06-30 DIAGNOSIS — E785 Hyperlipidemia, unspecified: Secondary | ICD-10-CM | POA: Diagnosis not present

## 2022-06-30 DIAGNOSIS — E559 Vitamin D deficiency, unspecified: Secondary | ICD-10-CM

## 2022-06-30 DIAGNOSIS — E1169 Type 2 diabetes mellitus with other specified complication: Secondary | ICD-10-CM | POA: Diagnosis not present

## 2022-06-30 MED ORDER — OZEMPIC (0.25 OR 0.5 MG/DOSE) 2 MG/3ML ~~LOC~~ SOPN: PEN_INJECTOR | SUBCUTANEOUS | 0 refills | Status: DC

## 2022-06-30 MED ORDER — VITAMIN D (ERGOCALCIFEROL) 1.25 MG (50000 UNIT) PO CAPS: 50000.0000 [IU] | ORAL_CAPSULE | ORAL | 0 refills | Status: DC

## 2022-07-01 LAB — HEMOGLOBIN A1C
Est. average glucose Bld gHb Est-mCnc: 146 mg/dL
Hgb A1c MFr Bld: 6.7 % — ABNORMAL HIGH (ref 4.8–5.6)

## 2022-07-01 LAB — LIPID PANEL WITH LDL/HDL RATIO
Cholesterol, Total: 177 mg/dL (ref 100–199)
HDL: 37 mg/dL — ABNORMAL LOW (ref >39)
LDL Chol Calc (NIH): 115 mg/dL — ABNORMAL HIGH (ref 0–99)
LDL/HDL Ratio: 3.1 ratio (ref 0.0–3.6)
Triglycerides: 141 mg/dL (ref 0–149)
VLDL Cholesterol Cal: 25 mg/dL (ref 5–40)

## 2022-07-01 LAB — COMPREHENSIVE METABOLIC PANEL
ALT: 31 IU/L (ref 0–44)
AST: 23 IU/L (ref 0–40)
Albumin/Globulin Ratio: 1.7 (ref 1.2–2.2)
Albumin: 4.2 g/dL (ref 3.8–4.9)
Alkaline Phosphatase: 72 IU/L (ref 44–121)
BUN/Creatinine Ratio: 26 — ABNORMAL HIGH (ref 9–20)
BUN: 22 mg/dL (ref 6–24)
Bilirubin Total: 0.6 mg/dL (ref 0.0–1.2)
CO2: 22 mmol/L (ref 20–29)
Calcium: 9 mg/dL (ref 8.7–10.2)
Chloride: 104 mmol/L (ref 96–106)
Creatinine, Ser: 0.86 mg/dL (ref 0.76–1.27)
Globulin, Total: 2.5 g/dL (ref 1.5–4.5)
Glucose: 156 mg/dL — ABNORMAL HIGH (ref 70–99)
Potassium: 4.6 mmol/L (ref 3.5–5.2)
Sodium: 142 mmol/L (ref 134–144)
Total Protein: 6.7 g/dL (ref 6.0–8.5)
eGFR: 100 mL/min/{1.73_m2} (ref >59)

## 2022-07-01 LAB — INSULIN, RANDOM: INSULIN: 30 u[IU]/mL — ABNORMAL HIGH (ref 2.6–24.9)

## 2022-07-01 LAB — VITAMIN D 25 HYDROXY (VIT D DEFICIENCY, FRACTURES): Vit D, 25-Hydroxy: 24.1 ng/mL — ABNORMAL LOW (ref 30.0–100.0)

## 2022-07-07 NOTE — Progress Notes (Signed)
Chief Complaint:   OBESITY Geoffrey Robinson is here to discuss his progress with his obesity treatment plan along with follow-up of his obesity related diagnoses. Geoffrey Robinson is on the Category 4 Plan and states he is following his eating plan approximately 25% of the time. 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  Today's visit was #: 6 Starting weight: 339 lbs Starting date: 01/14/2022 Today's weight: 323 lbs Today's date: 06/30/2022 Total lbs lost to date: 16 lbs Total lbs lost since last in-office visit: 0  Interim History: Geoffrey Robinson was seen here last on 04/29/22.  His wife had surgery and had some complications.  He has been caring for her and has not been able to follow the plan.  He also celebrated Thanksgiving and Christmas.  Not drinking enough water.  Subjective:   1. Diabetes mellitus type 2 in obese [facility]) Geoffrey Robinson has been out of Ozempic for 2 weeks.  He has not been checking his blood sugar at home.  Some polyphagia and cravings.   2. Vitamin D insufficiency Geoffrey Robinson is currently taking prescription Vit D 50,000 IU once a week.   Denies any nausea, vomiting or muscle weakness.  3. Dyslipidemia Geoffrey Robinson is taking Tricor 145 mg daily.  Denies any side effects.  Assessment/Plan:   1. Diabetes mellitus type 2 in obese [facility]) We will obtain labs today.  We will refill/restart Ozempic 0.5 mg SQ once weekly for 1 month with 0 refills.  Side effects discussed.   -Refill/Restart Semaglutide,0.25 or 0.5MG /DOS, (OZEMPIC, 0.25 OR 0.5 MG/DOSE,) 2 MG/3ML SOPN; Inject 0.5 mg into the skin weekly  Dispense: 3 mL; Refill: 0  - Comprehensive metabolic panel - Hemoglobin A1c - Insulin, random  2. Vitamin D insufficiency We will obtain labs today.  We will refill Vit D 50K IU once a week for 1 month with 0 refills. Refill Vitamin D, Ergocalciferol, (DRISDOL) 1.25 MG (50000 UNIT) CAPS capsule; Take 1 capsule (50,000 Units total) by mouth every 7 (seven)  days.  Dispense: 5 capsule; Refill: 0  - Comprehensive metabolic panel - VITAMIN D 25 Hydroxy (Vit-D Deficiency, Fractures)  3. Dyslipidemia We will obtain labs today.  Cardiovascular risk and specific lipid/LDL goals reviewed.  We discussed several lifestyle modifications today and Geoffrey Robinson will continue to work on diet, exercise and weight loss efforts. Orders and follow up as documented in patient record.   Counseling Intensive lifestyle modifications are the first line treatment for this issue. Dietary changes: Increase soluble fiber. Decrease simple carbohydrates. Exercise changes: Moderate to vigorous-intensity aerobic activity 150 minutes per week if tolerated. Lipid-lowering medications: see documented in medical record.   - Comprehensive metabolic panel - Lipid Panel With LDL/HDL Ratio  4. Obesity, Current BMI 43.9 Geoffrey Robinson is currently in the action stage of change. As such, his goal is to continue with weight loss efforts. He has agreed to the Category 4 Plan.   Goal is to get back on the meal plan.  Exercise goals: All adults should avoid inactivity. Some physical activity is better than none, and adults who participate in any amount of physical activity gain some health benefits.  Behavioral modification strategies: increasing lean protein intake, increasing water intake, and no skipping meals.  Geoffrey Robinson has agreed to follow-up with our clinic in 3 weeks. He was informed of the importance of frequent follow-up visits to maximize his success with intensive lifestyle modifications for his multiple health conditions.   Geoffrey Robinson was informed we would discuss  his lab results at his next visit unless there is a critical issue that needs to be addressed sooner. Geoffrey Robinson agreed to keep his next visit at the agreed upon time to discuss these results.  Objective:   Blood pressure (!) 152/84, pulse 61, temperature 98.2 F (36.8 C), height 6' (1.829 m), weight (!) 323 lb (146.5 kg), SpO2  97 %. Body mass index is 43.81 kg/m.  General: Cooperative, alert, well developed, in no acute distress. HEENT: Conjunctivae and lids unremarkable. Cardiovascular: Regular rhythm.  Lungs: Normal work of breathing. Neurologic: No focal deficits.   Lab Results  Component Value Date   CREATININE 0.86 06/30/2022   BUN 22 06/30/2022   NA 142 06/30/2022   K 4.6 06/30/2022   CL 104 06/30/2022   CO2 22 06/30/2022   Lab Results  Component Value Date   ALT 31 06/30/2022   AST 23 06/30/2022   ALKPHOS 72 06/30/2022   BILITOT 0.6 06/30/2022   Lab Results  Component Value Date   HGBA1C 6.7 (H) 06/30/2022   HGBA1C 7.3 (H) 01/14/2022   HGBA1C  04/10/2009    5.9 (NOTE) The ADA recommends the following therapeutic goal for glycemic control related to Hgb A1c measurement: Goal of therapy: <6.5 Hgb A1c  Reference: American Diabetes Association: Clinical Practice Recommendations 2010, Diabetes Care, 2010, 33: (Suppl  1).   Lab Results  Component Value Date   INSULIN 30.0 (H) 06/30/2022   INSULIN 27.1 (H) 01/14/2022   Lab Results  Component Value Date   TSH 0.488 01/14/2022   Lab Results  Component Value Date   CHOL 177 06/30/2022   HDL 37 (L) 06/30/2022   LDLCALC 115 (H) 06/30/2022   TRIG 141 06/30/2022   CHOLHDL 4.6 04/11/2009   Lab Results  Component Value Date   VD25OH 24.1 (L) 06/30/2022   VD25OH 26.4 (L) 01/14/2022   Lab Results  Component Value Date   WBC 5.5 12/17/2021   HGB 13.4 12/17/2021   HCT 40.4 12/17/2021   MCV 84 12/17/2021   PLT 219 12/17/2021   No results found for: "IRON", "TIBC", "FERRITIN"  Attestation Statements:   Reviewed by clinician on day of visit: allergies, medications, problem list, medical history, surgical history, family history, social history, and previous encounter notes.  I, Brendell Tyus, RMA, am acting as transcriptionist for Everardo Pacific, FNP.  I have reviewed the above documentation for accuracy and completeness, and  I agree with the above. Everardo Pacific, FNP

## 2022-07-22 ENCOUNTER — Encounter: Payer: Self-pay | Admitting: Nurse Practitioner

## 2022-07-22 ENCOUNTER — Ambulatory Visit: Payer: BC Managed Care – PPO | Admitting: Nurse Practitioner

## 2022-07-22 VITALS — BP 151/79 | HR 59 | Temp 97.8°F | Ht 72.0 in | Wt 322.0 lb

## 2022-07-22 DIAGNOSIS — E1169 Type 2 diabetes mellitus with other specified complication: Secondary | ICD-10-CM | POA: Diagnosis not present

## 2022-07-22 DIAGNOSIS — Z7985 Long-term (current) use of injectable non-insulin antidiabetic drugs: Secondary | ICD-10-CM

## 2022-07-22 DIAGNOSIS — E785 Hyperlipidemia, unspecified: Secondary | ICD-10-CM | POA: Diagnosis not present

## 2022-07-22 DIAGNOSIS — E559 Vitamin D deficiency, unspecified: Secondary | ICD-10-CM | POA: Diagnosis not present

## 2022-07-22 DIAGNOSIS — Z6841 Body Mass Index (BMI) 40.0 and over, adult: Secondary | ICD-10-CM

## 2022-07-22 MED ORDER — VITAMIN D (ERGOCALCIFEROL) 1.25 MG (50000 UNIT) PO CAPS: 50000.0000 [IU] | ORAL_CAPSULE | ORAL | 0 refills | Status: DC

## 2022-07-22 NOTE — Patient Instructions (Signed)
The 10-year ASCVD risk score (Arnett DK, et al., 2019) is: 24.6%   Values used to calculate the score:     Age: 62 years     Sex: Male     Is Non-Hispanic African American: No     Diabetic: Yes     Tobacco smoker: No     Systolic Blood Pressure: 428 mmHg     Is BP treated: Yes     HDL Cholesterol: 37 mg/dL     Total Cholesterol: 177 mg/dL

## 2022-07-30 NOTE — Progress Notes (Signed)
Chief Complaint:   OBESITY Geoffrey Robinson is here to discuss his progress with his obesity treatment plan along with follow-up of his obesity related diagnoses. Geoffrey Robinson is on the Category 4 Plan and states he is following his eating plan approximately 25% of the time. 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  Today's visit was #: 7 Starting weight: 339 lbs Starting date: 01/14/2022 Today's weight: 322 lbs Today's date: 07/22/2022 Total lbs lost to date: 17 lbs Total lbs lost since last in-office visit: 1  Interim History: Geoffrey Robinson has overall done well with weight loss.  Finds he is eating out more and making "bad choices" , not eating enough food, especially protein.  Eating too many carbs.  Drinking more water and 2 diet sodas daily.  Had cortisone injection in both knees on 07/20/22.  He wants to lose weight to have Lt knee replacement.  Subjective:   1. Dyslipidemia Labs discussed during visit today.  Family history:  mother and father.  Currently taking Tricor 145 mg.  Was on statin in the past.  2. Vitamin D insufficiency Labs discussed during visit today.  Geoffrey Robinson is currently taking prescription Vit D 50,000 IU once a week.  Denies any nausea, vomiting or muscle weakness.  3. Diabetes mellitus type 2 in obese [facility]) Labs discussed during visit today.  Geoffrey Robinson is taking Ozempic 0.5 mg.  Denies any side effects.  Notes polyphagia and cravings.  Denies hypoglycemia.   Assessment/Plan:   1. Dyslipidemia To discuss with cardiology.  ASCVD reviewed with patient.  Cardiovascular risk and specific lipid/LDL goals reviewed.  We discussed several lifestyle modifications today and Biran will continue to work on diet, exercise and weight loss efforts. Orders and follow up as documented in patient record.   Counseling Intensive lifestyle modifications are the first line treatment for this issue. Dietary changes: Increase soluble fiber. Decrease simple carbohydrates. Exercise changes: Moderate  to vigorous-intensity aerobic activity 150 minutes per week if tolerated. Lipid-lowering medications: see documented in medical record.   The 10-year ASCVD risk score (Arnett DK, et al., 2019) is: 24.6%   Values used to calculate the score:     Age: 62 years     Sex: Male     Is Non-Hispanic African American: No     Diabetic: Yes     Tobacco smoker: No     Systolic Blood Pressure: 160 mmHg     Is BP treated: Yes     HDL Cholesterol: 37 mg/dL     Total Cholesterol: 177 mg/dL  2. Vitamin D insufficiency We will refill Vit D 50K IU once a week for 1 month with 0 refills.  Side effects discussed.   -Refill Vitamin D, Ergocalciferol, (DRISDOL) 1.25 MG (50000 UNIT) CAPS capsule; Take 1 capsule (50,000 Units total) by mouth every 7 (seven) days.  Dispense: 5 capsule; Refill: 0  3. Diabetes mellitus type 2 in obese (HCC) Continue Ozempic 0.5 mg.  Side effects discussed.  Good blood sugar control is important to decrease the likelihood of diabetic complications such as nephropathy, neuropathy, limb loss, blindness, coronary artery disease, and death. Intensive lifestyle modification including diet, exercise and weight loss are the first line of treatment for diabetes.    4. Morbid obesity ([place])  5. BMI 40.0-44.9, adult [facility]) Geoffrey Robinson is currently in the action stage of change. As such, his goal is to continue with weight loss efforts. He has agreed to the Category 4 Plan.   Exercise goals: All adults should avoid inactivity.  Some physical activity is better than none, and adults who participate in any amount of physical activity gain some health benefits.  Behavioral modification strategies: increasing lean protein intake, increasing vegetables, increasing water intake, and planning for success.  Geoffrey Robinson has agreed to follow-up with our clinic in 4 weeks. He was informed of the importance of frequent follow-up visits to maximize his success with intensive lifestyle modifications for his  multiple health conditions.   Objective:   Blood pressure (!) 151/79, pulse (!) 59, temperature 97.8 F (36.6 C), height 6' (1.829 m), weight (!) 322 lb (146.1 kg), SpO2 97 %. Body mass index is 43.67 kg/m.  General: Cooperative, alert, well developed, in no acute distress. HEENT: Conjunctivae and lids unremarkable. Cardiovascular: Regular rhythm.  Lungs: Normal work of breathing. Neurologic: No focal deficits.   Lab Results  Component Value Date   CREATININE 0.86 06/30/2022   BUN 22 06/30/2022   NA 142 06/30/2022   K 4.6 06/30/2022   CL 104 06/30/2022   CO2 22 06/30/2022   Lab Results  Component Value Date   ALT 31 06/30/2022   AST 23 06/30/2022   ALKPHOS 72 06/30/2022   BILITOT 0.6 06/30/2022   Lab Results  Component Value Date   HGBA1C 6.7 (H) 06/30/2022   HGBA1C 7.3 (H) 01/14/2022   HGBA1C  04/10/2009    5.9 (NOTE) The ADA recommends the following therapeutic goal for glycemic control related to Hgb A1c measurement: Goal of therapy: <6.5 Hgb A1c  Reference: American Diabetes Association: Clinical Practice Recommendations 2010, Diabetes Care, 2010, 33: (Suppl  1).   Lab Results  Component Value Date   INSULIN 30.0 (H) 06/30/2022   INSULIN 27.1 (H) 01/14/2022   Lab Results  Component Value Date   TSH 0.488 01/14/2022   Lab Results  Component Value Date   CHOL 177 06/30/2022   HDL 37 (L) 06/30/2022   LDLCALC 115 (H) 06/30/2022   TRIG 141 06/30/2022   CHOLHDL 4.6 04/11/2009   Lab Results  Component Value Date   VD25OH 24.1 (L) 06/30/2022   VD25OH 26.4 (L) 01/14/2022   Lab Results  Component Value Date   WBC 5.5 12/17/2021   HGB 13.4 12/17/2021   HCT 40.4 12/17/2021   MCV 84 12/17/2021   PLT 219 12/17/2021   No results found for: "IRON", "TIBC", "FERRITIN"  Attestation Statements:   Reviewed by clinician on day of visit: allergies, medications, problem list, medical history, surgical history, family history, social history, and previous  encounter notes.  I, Brendell Tyus, RMA, am acting as transcriptionist for Everardo Pacific, FNP.  I have reviewed the above documentation for accuracy and completeness, and I agree with the above. Everardo Pacific, FNP

## 2022-08-05 ENCOUNTER — Other Ambulatory Visit: Payer: Self-pay | Admitting: Nurse Practitioner

## 2022-08-05 ENCOUNTER — Other Ambulatory Visit (HOSPITAL_COMMUNITY): Payer: Self-pay | Admitting: Cardiology

## 2022-08-05 ENCOUNTER — Telehealth (INDEPENDENT_AMBULATORY_CARE_PROVIDER_SITE_OTHER): Payer: Self-pay | Admitting: Nurse Practitioner

## 2022-08-05 DIAGNOSIS — E1169 Type 2 diabetes mellitus with other specified complication: Secondary | ICD-10-CM

## 2022-08-05 MED ORDER — OZEMPIC (0.25 OR 0.5 MG/DOSE) 2 MG/3ML ~~LOC~~ SOPN: PEN_INJECTOR | SUBCUTANEOUS | 0 refills | Status: DC

## 2022-08-05 NOTE — Telephone Encounter (Signed)
Mychart message sent to patient.

## 2022-08-05 NOTE — Telephone Encounter (Signed)
Patient called stating that he did not receive his refill of Semaglutide,0.25 or 0.5MG/DOS, (OZEMPIC, 0.25 OR 0.5 MG/DOSE,) 2 MG/3ML SOPN. Pt is now completely out. Please call patient at [phone].      [facility].

## 2022-08-25 ENCOUNTER — Other Ambulatory Visit: Payer: Self-pay | Admitting: Internal Medicine

## 2022-08-25 DIAGNOSIS — I4819 Other persistent atrial fibrillation: Secondary | ICD-10-CM

## 2022-08-25 NOTE — Telephone Encounter (Signed)
Prescription refill request for Xarelto received.  Indication: afib  Last office visit: Turner, 06/03/2022 Weight: 146 kg  Age: 62 yo  Scr: 0.86, 06/30/2022 CrCl: 191 ml/min   Refill sent.

## 2022-08-26 ENCOUNTER — Encounter: Payer: Self-pay | Admitting: Nurse Practitioner

## 2022-08-26 ENCOUNTER — Ambulatory Visit: Payer: BC Managed Care – PPO | Admitting: Nurse Practitioner

## 2022-08-26 VITALS — BP 148/88 | HR 60 | Temp 97.8°F | Ht 72.0 in | Wt 315.0 lb

## 2022-08-26 DIAGNOSIS — Z7985 Long-term (current) use of injectable non-insulin antidiabetic drugs: Secondary | ICD-10-CM

## 2022-08-26 DIAGNOSIS — E1169 Type 2 diabetes mellitus with other specified complication: Secondary | ICD-10-CM

## 2022-08-26 DIAGNOSIS — Z6841 Body Mass Index (BMI) 40.0 and over, adult: Secondary | ICD-10-CM

## 2022-08-26 DIAGNOSIS — E669 Obesity, unspecified: Secondary | ICD-10-CM

## 2022-08-26 MED ORDER — OZEMPIC (0.25 OR 0.5 MG/DOSE) 2 MG/3ML ~~LOC~~ SOPN: PEN_INJECTOR | SUBCUTANEOUS | 0 refills | Status: DC

## 2022-08-26 NOTE — Progress Notes (Signed)
Office: [phone]  /  Fax: [phone]  WEIGHT SUMMARY AND BIOMETRICS  Weight Lost Since Last Visit: 7lb  No data recorded  Vitals Temp: 97.8 F (36.6 C) BP: (!) 148/88 Pulse Rate: 60 SpO2: 98 %   Anthropometric Measurements Height: 6' (1.829 m) Weight: (!) 315 lb (142.9 kg) BMI (Calculated): 42.71 Weight at Last Visit: 322lb Weight Lost Since Last Visit: 7lb Starting Weight: 339lb Total Weight Loss (lbs): 24 lb (10.9 kg)   Body Composition  Body Fat %: 43.7 % Fat Mass (lbs): 138 lbs Muscle Mass (lbs): 169 lbs Visceral Fat Rating : 29   Other Clinical Data Today's Visit #: 8 Starting Date: 01/14/22     HPI  Chief Complaint: OBESITY  Geoffrey Robinson is here to discuss his progress with his obesity treatment plan. He is on the the Category 4 Plan and states he is following his eating plan approximately 60 % of the time. He states he is exercising 0 minutes 0 days per week due to knee pain.    Interval History:  Since last office visit he has lost 7 pounds.  "It's a constant battle and I never know what I want to eat".  Sometimes he meals plan but after meal planning for the week he may not want what he picked out earlier in the week.  Since he started Ozempic he finds he is not hungry.  He does make himself eat.  He is not skipping meals.  He is eating a protein with each meal.  He wants to lose weight to have Lt knee replacement (<290 to be able to proceed with surgery)    Pharmacotherapy for weight loss: He is not currently taking medications  for medical weight loss.    Previous pharmacotherapy for medical weight loss:  never been on weight loss medications.   Bariatric surgery:  never had bariatric surgery  Pharmacotherapy for DMT2:  He is currently taking Ozempic 0.'5mg'$ .  Denies side effects.   Last A1c was 6.7 CBGs: Fasting 129-140 Episodes of hypoglycemia: no On ACE or ARB, ASA '81mg'$  and statin.  Last eye exam:  2023  Lab Results  Component Value  Date   HGBA1C 6.7 (H) 06/30/2022   HGBA1C 7.3 (H) 01/14/2022   HGBA1C  04/10/2009    5.9 (NOTE) The ADA recommends the following therapeutic goal for glycemic control related to Hgb A1c measurement: Goal of therapy: <6.5 Hgb A1c  Reference: American Diabetes Association: Clinical Practice Recommendations 2010, Diabetes Care, 2010, 33: (Suppl  1).   Lab Results  Component Value Date   LDLCALC 115 (H) 06/30/2022   CREATININE 0.86 06/30/2022     PHYSICAL EXAM:  Blood pressure (!) 148/88, pulse 60, temperature 97.8 F (36.6 C), height 6' (1.829 m), weight (!) 315 lb (142.9 kg), SpO2 98 %. Body mass index is 42.72 kg/m.  General: He is overweight, cooperative, alert, well developed, and in no acute distress. PSYCH: Has normal mood, affect and thought process.   Extremities: No edema.  Neurologic: No gross sensory or motor deficits. No tremors or fasciculations noted.    DIAGNOSTIC DATA REVIEWED:  BMET    Component Value Date/Time   NA 142 06/30/2022 0912   K 4.6 06/30/2022 0912   CL 104 06/30/2022 0912   CO2 22 06/30/2022 0912   GLUCOSE 156 (H) 06/30/2022 0912   GLUCOSE 215 (H) 09/07/2019 0800   BUN 22 06/30/2022 0912   CREATININE 0.86 06/30/2022 0912   CREATININE 0.99 03/11/2016 0853   CALCIUM  9.0 06/30/2022 0912   GFRNONAA >60 08/15/2019 0820   GFRAA >60 08/15/2019 0820   Lab Results  Component Value Date   HGBA1C 6.7 (H) 06/30/2022   HGBA1C  04/10/2009    5.9 (NOTE) The ADA recommends the following therapeutic goal for glycemic control related to Hgb A1c measurement: Goal of therapy: <6.5 Hgb A1c  Reference: American Diabetes Association: Clinical Practice Recommendations 2010, Diabetes Care, 2010, 33: (Suppl  1).   Lab Results  Component Value Date   INSULIN 30.0 (H) 06/30/2022   INSULIN 27.1 (H) 01/14/2022   Lab Results  Component Value Date   TSH 0.488 01/14/2022   CBC    Component Value Date/Time   WBC 5.5 12/17/2021 0845   WBC 5.0 03/11/2016 0853    RBC 4.81 12/17/2021 0845   RBC 4.82 03/11/2016 0853   HGB 13.4 12/17/2021 0845   HCT 40.4 12/17/2021 0845   PLT 219 12/17/2021 0845   MCV 84 12/17/2021 0845   MCH 27.9 12/17/2021 0845   MCH 29.0 03/11/2016 0853   MCHC 33.2 12/17/2021 0845   MCHC 34.2 03/11/2016 0853   RDW 12.9 12/17/2021 0845   Iron Studies No results found for: "IRON", "TIBC", "FERRITIN", "IRONPCTSAT" Lipid Panel     Component Value Date/Time   CHOL 177 06/30/2022 0912   TRIG 141 06/30/2022 0912   HDL 37 (L) 06/30/2022 0912   CHOLHDL 4.6 04/11/2009 0451   VLDL 20 04/11/2009 0451   LDLCALC 115 (H) 06/30/2022 0912   Hepatic Function Panel     Component Value Date/Time   PROT 6.7 06/30/2022 0912   ALBUMIN 4.2 06/30/2022 0912   AST 23 06/30/2022 0912   ALT 31 06/30/2022 0912   ALKPHOS 72 06/30/2022 0912   BILITOT 0.6 06/30/2022 0912      Component Value Date/Time   TSH 0.488 01/14/2022 0902   Nutritional Lab Results  Component Value Date   VD25OH 24.1 (L) 06/30/2022   VD25OH 26.4 (L) 01/14/2022     ASSESSMENT AND PLAN  TREATMENT PLAN FOR OBESITY:  Recommended Dietary Goals  Geoffrey Robinson is currently in the action stage of change. As such, his goal is to continue weight management plan. He has agreed to the Category 4 Plan.  Behavioral Intervention  We discussed the following Behavioral Modification Strategies today: increasing lean protein intake, decreasing simple carbohydrates , increasing vegetables, avoiding skipping meals, increasing water intake, and work on meal planning and easy cooking plans.  Additional resources provided today: NA  Recommended Physical Activity Goals  Geoffrey Robinson has been advised to work up to 150 minutes of moderate intensity aerobic activity a week and strengthening exercises 2-3 times per week for cardiovascular health, weight loss maintenance and preservation of muscle mass.   He has agreed to increase physical activity in their day and reduce sedentary time  (increase NEAT).  and Patient also encouraged on scheduling and tracking physical activity.    Pharmacotherapy We discussed various medication options to help Geoffrey Robinson with his weight loss efforts and we both agreed to continue Ozempic 0.'5mg'$ .  ASSOCIATED CONDITIONS ADDRESSED TODAY  Action/Plan  Diabetes mellitus type 2 in obese (HCC) -     Ozempic (0.25 or 0.5 MG/DOSE); Inject 0.5 mg into the skin weekly  Dispense: 3 mL; Refill: 0.  Side effects discussed.  Good blood sugar control is important to decrease the likelihood of diabetic complications such as nephropathy, neuropathy, limb loss, blindness, coronary artery disease, and death. Intensive lifestyle modification including diet, exercise and weight loss are  the first line of treatment for diabetes.    Morbid obesity ([place])  BMI 40.0-44.9, adult ([place])       Return in about 4 weeks (around 09/23/2022).[name] He was informed of the importance of frequent follow up visits to maximize his success with intensive lifestyle modifications for his multiple health conditions.   ATTESTASTION STATEMENTS:  Reviewed by clinician on day of visit: allergies, medications, problem list, medical history, surgical history, family history, social history, and previous encounter notes.     Ailene Rud. Lamonica Trueba FNP-C

## 2022-09-24 ENCOUNTER — Ambulatory Visit: Payer: BC Managed Care – PPO | Admitting: Nurse Practitioner

## 2022-09-24 ENCOUNTER — Encounter: Payer: Self-pay | Admitting: Nurse Practitioner

## 2022-09-24 VITALS — BP 150/76 | HR 58 | Temp 97.8°F | Ht 72.0 in | Wt 316.0 lb

## 2022-09-24 DIAGNOSIS — Z7985 Long-term (current) use of injectable non-insulin antidiabetic drugs: Secondary | ICD-10-CM

## 2022-09-24 DIAGNOSIS — E559 Vitamin D deficiency, unspecified: Secondary | ICD-10-CM

## 2022-09-24 DIAGNOSIS — Z6841 Body Mass Index (BMI) 40.0 and over, adult: Secondary | ICD-10-CM | POA: Diagnosis not present

## 2022-09-24 DIAGNOSIS — E1169 Type 2 diabetes mellitus with other specified complication: Secondary | ICD-10-CM

## 2022-09-24 DIAGNOSIS — E669 Obesity, unspecified: Secondary | ICD-10-CM

## 2022-09-24 MED ORDER — VITAMIN D (ERGOCALCIFEROL) 1.25 MG (50000 UNIT) PO CAPS: 50000.0000 [IU] | ORAL_CAPSULE | ORAL | 0 refills | Status: DC

## 2022-09-24 MED ORDER — OZEMPIC (0.25 OR 0.5 MG/DOSE) 2 MG/3ML ~~LOC~~ SOPN: PEN_INJECTOR | SUBCUTANEOUS | 0 refills | Status: DC

## 2022-09-24 NOTE — Progress Notes (Signed)
Office: [phone]  /  Fax: [phone]  WEIGHT SUMMARY AND BIOMETRICS  Weight Lost Since Last Visit: 0  Weight Gained Since Last Visit: 1lb   Vitals Temp: 97.8 F (36.6 C) BP: (!) 150/76 Pulse Rate: (!) 58 SpO2: 98 %   Anthropometric Measurements Height: 6' (1.829 m) Weight: (!) 316 lb (143.3 kg) BMI (Calculated): 42.85 Weight at Last Visit: 315lb Weight Lost Since Last Visit: 0 Weight Gained Since Last Visit: 1lb Starting Weight: 339lb Total Weight Loss (lbs): 23 lb (10.4 kg)   Body Composition  Body Fat %: 43.8 % Fat Mass (lbs): 138.4 lbs Muscle Mass (lbs): 169 lbs Visceral Fat Rating : 29   Other Clinical Data Fasting: yes Labs: no Today's Visit #: 9 Starting Date: 01/14/22     HPI  Chief Complaint: OBESITY  Geoffrey Robinson is here to discuss his progress with his obesity treatment plan. He is on the the Category 4 Plan and states he is following his eating plan approximately 60 % of the time. He states he is exercising 0 minutes 0 days per week.   Interval History:  Since last office visit he has gained 1 pound. He is not skipping meals.  He is eating more protein and watches his carbs intake. He struggles with wanting to know what he wants to eat.  He is trying to drink more water.  He drinks 2 or less diet sodas daily.    He reports things have been crazy.  He has been helping his wife move to a new office.  He wants to lose weight to have Lt knee replacement (<290 to be able to proceed with surgery).  He feels that his clothes are fitting better.  His pant size is decreasing.  His highest weight was 392 lbs.    Pharmacotherapy for weight loss: He is not currently taking medications  for medical weight loss.    Previous pharmacotherapy for medical weight loss:  never been on weight loss meds.    Bariatric surgery:  Patient never had bariatric surgery.    Pharmacotherapy for DMT2:  He is currently taking Ozempic 0.5mg .  Denies side effects.   Last  A1c was 6.7 CBGs: Fasting 121-142 Episodes of hypoglycemia: no On ACE or ARB, ASA 81mg  and statin.  Last eye exam:  2023   Lab Results  Component Value Date   HGBA1C 6.7 (H) 06/30/2022   HGBA1C 7.3 (H) 01/14/2022   HGBA1C  04/10/2009    5.9 (NOTE) The ADA recommends the following therapeutic goal for glycemic control related to Hgb A1c measurement: Goal of therapy: <6.5 Hgb A1c  Reference: American Diabetes Association: Clinical Practice Recommendations 2010, Diabetes Care, 2010, 33: (Suppl  1).   Lab Results  Component Value Date   LDLCALC 115 (H) 06/30/2022   CREATININE 0.86 06/30/2022    Vit D deficiency  He is taking Vit D 50,000 IU weekly.  Denies side effects.  Denies nausea, vomiting or muscle weakness.    Lab Results  Component Value Date   VD25OH 24.1 (L) 06/30/2022   VD25OH 26.4 (L) 01/14/2022    PHYSICAL EXAM:  Blood pressure (!) 150/76, pulse (!) 58, temperature 97.8 F (36.6 C), height 6' (1.829 m), weight (!) 316 lb (143.3 kg), SpO2 98 %. Body mass index is 42.86 kg/m.  General: He is overweight, cooperative, alert, well developed, and in no acute distress. PSYCH: Has normal mood, affect and thought process.   Extremities: No edema.  Neurologic: No gross sensory or motor  deficits. No tremors or fasciculations noted.    DIAGNOSTIC DATA REVIEWED:  BMET    Component Value Date/Time   NA 142 06/30/2022 0912   K 4.6 06/30/2022 0912   CL 104 06/30/2022 0912   CO2 22 06/30/2022 0912   GLUCOSE 156 (H) 06/30/2022 0912   GLUCOSE 215 (H) 09/07/2019 0800   BUN 22 06/30/2022 0912   CREATININE 0.86 06/30/2022 0912   CREATININE 0.99 03/11/2016 0853   CALCIUM 9.0 06/30/2022 0912   GFRNONAA >60 08/15/2019 0820   GFRAA >60 08/15/2019 0820   Lab Results  Component Value Date   HGBA1C 6.7 (H) 06/30/2022   HGBA1C  04/10/2009    5.9 (NOTE) The ADA recommends the following therapeutic goal for glycemic control related to Hgb A1c measurement: Goal of therapy:  <6.5 Hgb A1c  Reference: American Diabetes Association: Clinical Practice Recommendations 2010, Diabetes Care, 2010, 33: (Suppl  1).   Lab Results  Component Value Date   INSULIN 30.0 (H) 06/30/2022   INSULIN 27.1 (H) 01/14/2022   Lab Results  Component Value Date   TSH 0.488 01/14/2022   CBC    Component Value Date/Time   WBC 5.5 12/17/2021 0845   WBC 5.0 03/11/2016 0853   RBC 4.81 12/17/2021 0845   RBC 4.82 03/11/2016 0853   HGB 13.4 12/17/2021 0845   HCT 40.4 12/17/2021 0845   PLT 219 12/17/2021 0845   MCV 84 12/17/2021 0845   MCH 27.9 12/17/2021 0845   MCH 29.0 03/11/2016 0853   MCHC 33.2 12/17/2021 0845   MCHC 34.2 03/11/2016 0853   RDW 12.9 12/17/2021 0845   Iron Studies No results found for: "IRON", "TIBC", "FERRITIN", "IRONPCTSAT" Lipid Panel     Component Value Date/Time   CHOL 177 06/30/2022 0912   TRIG 141 06/30/2022 0912   HDL 37 (L) 06/30/2022 0912   CHOLHDL 4.6 04/11/2009 0451   VLDL 20 04/11/2009 0451   LDLCALC 115 (H) 06/30/2022 0912   Hepatic Function Panel     Component Value Date/Time   PROT 6.7 06/30/2022 0912   ALBUMIN 4.2 06/30/2022 0912   AST 23 06/30/2022 0912   ALT 31 06/30/2022 0912   ALKPHOS 72 06/30/2022 0912   BILITOT 0.6 06/30/2022 0912      Component Value Date/Time   TSH 0.488 01/14/2022 0902   Nutritional Lab Results  Component Value Date   VD25OH 24.1 (L) 06/30/2022   VD25OH 26.4 (L) 01/14/2022     ASSESSMENT AND PLAN  TREATMENT PLAN FOR OBESITY:  Recommended Dietary Goals  Geoffrey Robinson is currently in the action stage of change. As such, his goal is to continue weight management plan. He has agreed to keeping a food journal and adhering to recommended goals of 1800-2000 calories and 100+ protein.  Behavioral Intervention  We discussed the following Behavioral Modification Strategies today: increasing lean protein intake, decreasing simple carbohydrates , increasing vegetables, increasing fiber rich foods, avoiding  skipping meals, increasing water intake, and work on meal planning and preparation.  I asked him to track for a couple days and I will review at next visit.    Additional resources provided today: NA  Recommended Physical Activity Goals  Geoffrey Robinson has been advised to work up to 150 minutes of moderate intensity aerobic activity a week and strengthening exercises 2-3 times per week for cardiovascular health, weight loss maintenance and preservation of muscle mass.   He has agreed to Think about ways to increase physical activity and Work on scheduling and tracking physical activity.  Pharmacotherapy We discussed various medication options to help Labon with his weight loss efforts and we both agreed to continue Ozempic 0.5mg .  Will not increase to 1mg  at this time due to patient struggling with meeting protein/calorie goals.    ASSOCIATED CONDITIONS ADDRESSED TODAY  Action/Plan  Type 2 diabetes mellitus with obesity -     Ozempic (0.25 or 0.5 MG/DOSE); Inject 0.5 mg into the skin weekly  Dispense: 3 mL; Refill: 0  Good blood sugar control is important to decrease the likelihood of diabetic complications such as nephropathy, neuropathy, limb loss, blindness, coronary artery disease, and death. Intensive lifestyle modification including diet, exercise and weight loss are the first line of treatment for diabetes.    Vitamin D insufficiency -     Vitamin D (Ergocalciferol); Take 1 capsule (50,000 Units total) by mouth every 7 (seven) days.  Dispense: 5 capsule; Refill: 0. Side effects discussed.   Low Vitamin D level contributes to fatigue and are associated with obesity, breast, and colon cancer. He agrees to continue to take prescription Vitamin D @50 ,000 IU every week and will follow-up for routine testing of Vitamin D, at least 2-3 times per year to avoid over-replacement.   Morbid obesity  BMI 40.0-44.9, adult       Labs at next visit.   Return in about 4 weeks (around  10/22/2022).[name] He was informed of the importance of frequent follow up visits to maximize his success with intensive lifestyle modifications for his multiple health conditions.   ATTESTASTION STATEMENTS:  Reviewed by clinician on day of visit: allergies, medications, problem list, medical history, surgical history, family history, social history, and previous encounter notes.     Ailene Rud. Hayato Guaman FNP-C

## 2022-10-27 ENCOUNTER — Other Ambulatory Visit: Payer: Self-pay | Admitting: Internal Medicine

## 2022-10-28 ENCOUNTER — Ambulatory Visit: Payer: BC Managed Care – PPO | Admitting: Nurse Practitioner

## 2022-10-28 ENCOUNTER — Encounter: Payer: Self-pay | Admitting: Nurse Practitioner

## 2022-10-28 VITALS — BP 144/80 | HR 61 | Temp 98.2°F | Ht 72.0 in | Wt 316.0 lb

## 2022-10-28 DIAGNOSIS — E559 Vitamin D deficiency, unspecified: Secondary | ICD-10-CM

## 2022-10-28 DIAGNOSIS — Z7985 Long-term (current) use of injectable non-insulin antidiabetic drugs: Secondary | ICD-10-CM

## 2022-10-28 DIAGNOSIS — E1169 Type 2 diabetes mellitus with other specified complication: Secondary | ICD-10-CM

## 2022-10-28 DIAGNOSIS — E785 Hyperlipidemia, unspecified: Secondary | ICD-10-CM | POA: Diagnosis not present

## 2022-10-28 DIAGNOSIS — Z6841 Body Mass Index (BMI) 40.0 and over, adult: Secondary | ICD-10-CM

## 2022-10-28 MED ORDER — VITAMIN D (ERGOCALCIFEROL) 1.25 MG (50000 UNIT) PO CAPS: 50000.0000 [IU] | ORAL_CAPSULE | ORAL | 0 refills | Status: DC

## 2022-10-28 MED ORDER — SEMAGLUTIDE (1 MG/DOSE) 4 MG/3ML ~~LOC~~ SOPN
1.0000 mg | PEN_INJECTOR | SUBCUTANEOUS | 0 refills | Status: DC
Start: 2022-10-28 — End: 2022-12-02

## 2022-10-28 NOTE — Patient Instructions (Signed)

## 2022-10-28 NOTE — Progress Notes (Signed)
Office: [phone]  /  Fax: [phone]  WEIGHT SUMMARY AND BIOMETRICS  Weight Lost Since Last Visit: 0lb  Weight Gained Since Last Visit: 0lb   Vitals Temp: 98.2 F (36.8 C) BP: (!) 144/80 Pulse Rate: 61 SpO2: 96 %   Anthropometric Measurements Height: 6' (1.829 m) Weight: (!) 316 lb (143.3 kg) BMI (Calculated): 42.85 Weight at Last Visit: 316lb Weight Lost Since Last Visit: 0lb Weight Gained Since Last Visit: 0lb Starting Weight: 339lb Total Weight Loss (lbs): 23 lb (10.4 kg)   Body Composition  Body Fat %: 43.9 % Fat Mass (lbs): 138.8 lbs Muscle Mass (lbs): 168.6 lbs Visceral Fat Rating : 29   Other Clinical Data Fasting: Yes Labs: No Today's Visit #: 10 Starting Date: 01/14/22     HPI  Chief Complaint: OBESITY  Geoffrey Robinson is here to discuss his progress with his obesity treatment plan. He is on the the Category 4 Plan and states he is following his eating plan approximately 50 % of the time. He states he is exercising 0 minutes 0 days per week.   Interval History:  Since last office visit he has maintained his weight. He feels like he is doing better and losing inches. His clothes are fitting better. He is not skipping meals. He is eating a protein with every meals.  He feels he is meeting his protein and calories.   He has been working in his yard more.   He wants to lose weight to have Lt knee replacement (<290 to be able to proceed with surgery).    Pharmacotherapy for weight loss: He is not currently taking medications  for medical weight loss.    Previous pharmacotherapy for medical weight loss:  none  Bariatric surgery:  Patient has not had bariatric surgery.     Pharmacotherapy for DMT2:  He is currently taking Ozempic 0.5mg .  Denies side effects.   Last A1c was 6.7 CBGs: Fasting 120s-140s Episodes of hypoglycemia: no On ACE Last eye exam:  2023-has appt scheduled in August   Lab Results  Component Value Date   HGBA1C 6.7 (H)  06/30/2022   HGBA1C 7.3 (H) 01/14/2022   HGBA1C  04/10/2009    5.9 (NOTE) The ADA recommends the following therapeutic goal for glycemic control related to Hgb A1c measurement: Goal of therapy: <6.5 Hgb A1c  Reference: American Diabetes Association: Clinical Practice Recommendations 2010, Diabetes Care, 2010, 33: (Suppl  1).   Lab Results  Component Value Date   LDLCALC 115 (H) 06/30/2022   CREATININE 0.86 06/30/2022     Hyperlipidemia Medication(s): Tricor 145mg . Denies side effects.   Took a statin in the past and was stopped by cardiology. Has follow up appt with cardiology on 11/19/22  Lab Results  Component Value Date   CHOL 177 06/30/2022   HDL 37 (L) 06/30/2022   LDLCALC 115 (H) 06/30/2022   TRIG 141 06/30/2022   CHOLHDL 4.6 04/11/2009   Lab Results  Component Value Date   ALT 31 06/30/2022   AST 23 06/30/2022   ALKPHOS 72 06/30/2022   BILITOT 0.6 06/30/2022   The 10-year ASCVD risk score (Arnett DK, et al., 2019) is: 22.8%   Values used to calculate the score:     Age: 62 years     Sex: Male     Is Non-Hispanic African American: No     Diabetic: Yes     Tobacco smoker: No     Systolic Blood Pressure: 144 mmHg     Is  BP treated: Yes     HDL Cholesterol: 37 mg/dL     Total Cholesterol: 177 mg/dL    Vit D deficiency  He is taking Vit D 50,000 IU weekly.  Denies side effects.  Denies nausea, vomiting or muscle weakness.    Lab Results  Component Value Date   VD25OH 24.1 (L) 06/30/2022   VD25OH 26.4 (L) 01/14/2022    PHYSICAL EXAM:  Blood pressure (!) 144/80, pulse 61, temperature 98.2 F (36.8 C), height 6' (1.829 m), weight (!) 316 lb (143.3 kg), SpO2 96 %. Body mass index is 42.86 kg/m.  General: He is overweight, cooperative, alert, well developed, and in no acute distress. PSYCH: Has normal mood, affect and thought process.   Extremities: No edema.  Neurologic: No gross sensory or motor deficits. No tremors or fasciculations noted.    DIAGNOSTIC  DATA REVIEWED:  BMET    Component Value Date/Time   NA 142 06/30/2022 0912   K 4.6 06/30/2022 0912   CL 104 06/30/2022 0912   CO2 22 06/30/2022 0912   GLUCOSE 156 (H) 06/30/2022 0912   GLUCOSE 215 (H) 09/07/2019 0800   BUN 22 06/30/2022 0912   CREATININE 0.86 06/30/2022 0912   CREATININE 0.99 03/11/2016 0853   CALCIUM 9.0 06/30/2022 0912   GFRNONAA >60 08/15/2019 0820   GFRAA >60 08/15/2019 0820   Lab Results  Component Value Date   HGBA1C 6.7 (H) 06/30/2022   HGBA1C  04/10/2009    5.9 (NOTE) The ADA recommends the following therapeutic goal for glycemic control related to Hgb A1c measurement: Goal of therapy: <6.5 Hgb A1c  Reference: American Diabetes Association: Clinical Practice Recommendations 2010, Diabetes Care, 2010, 33: (Suppl  1).   Lab Results  Component Value Date   INSULIN 30.0 (H) 06/30/2022   INSULIN 27.1 (H) 01/14/2022   Lab Results  Component Value Date   TSH 0.488 01/14/2022   CBC    Component Value Date/Time   WBC 5.5 12/17/2021 0845   WBC 5.0 03/11/2016 0853   RBC 4.81 12/17/2021 0845   RBC 4.82 03/11/2016 0853   HGB 13.4 12/17/2021 0845   HCT 40.4 12/17/2021 0845   PLT 219 12/17/2021 0845   MCV 84 12/17/2021 0845   MCH 27.9 12/17/2021 0845   MCH 29.0 03/11/2016 0853   MCHC 33.2 12/17/2021 0845   MCHC 34.2 03/11/2016 0853   RDW 12.9 12/17/2021 0845   Iron Studies No results found for: "IRON", "TIBC", "FERRITIN", "IRONPCTSAT" Lipid Panel     Component Value Date/Time   CHOL 177 06/30/2022 0912   TRIG 141 06/30/2022 0912   HDL 37 (L) 06/30/2022 0912   CHOLHDL 4.6 04/11/2009 0451   VLDL 20 04/11/2009 0451   LDLCALC 115 (H) 06/30/2022 0912   Hepatic Function Panel     Component Value Date/Time   PROT 6.7 06/30/2022 0912   ALBUMIN 4.2 06/30/2022 0912   AST 23 06/30/2022 0912   ALT 31 06/30/2022 0912   ALKPHOS 72 06/30/2022 0912   BILITOT 0.6 06/30/2022 0912      Component Value Date/Time   TSH 0.488 01/14/2022 0902    Nutritional Lab Results  Component Value Date   VD25OH 24.1 (L) 06/30/2022   VD25OH 26.4 (L) 01/14/2022     ASSESSMENT AND PLAN  TREATMENT PLAN FOR OBESITY:  Recommended Dietary Goals  Geoffrey Robinson is currently in the action stage of change. As such, his goal is to continue weight management plan. He has agreed to the Category 4 Plan.  Behavioral Intervention  We discussed the following Behavioral Modification Strategies today: increasing lean protein intake, decreasing simple carbohydrates , increasing vegetables, increasing lower glycemic fruits, increasing fiber rich foods, avoiding skipping meals, increasing water intake, decreasing sodium intake, avoiding temptations and identifying enticing environmental cues, continue to work on implementation of reduced calorie nutritional plan, continue to practice mindfulness when eating, and planning for success.  Additional resources provided today: NA  Recommended Physical Activity Goals  Ransom has been advised to work up to 150 minutes of moderate intensity aerobic activity a week and strengthening exercises 2-3 times per week for cardiovascular health, weight loss maintenance and preservation of muscle mass.   He has agreed to Think about ways to increase physical activity, Increase physical activity in their day and reduce sedentary time (increase NEAT)., and Work on scheduling and tracking physical activity.     ASSOCIATED CONDITIONS ADDRESSED TODAY  Action/Plan  Type 2 diabetes mellitus with obesity (HCC) -     Comprehensive metabolic panel -     Hemoglobin A1c -     Increase Semaglutide (1 MG/DOSE); Inject 1 mg as directed once a week.  Dispense: 3 mL; Refill: 0.  Side effects discussed.  Good blood sugar control is important to decrease the likelihood of diabetic complications such as nephropathy, neuropathy, limb loss, blindness, coronary artery disease, and death. Intensive lifestyle modification including diet, exercise  and weight loss are the first line of treatment for diabetes.    Vitamin D insufficiency -     Comprehensive metabolic panel -     VITAMIN D 25 Hydroxy (Vit-D Deficiency, Fractures) -     Vitamin D (Ergocalciferol); Take 1 capsule (50,000 Units total) by mouth every 7 (seven) days.  Dispense: 5 capsule; Refill: 0  Dyslipidemia -     Lipid Panel With LDL/HDL Ratio. Keep follow up appt with cardiology. Continue meds as directed.  To discuss lipids and treatment options with cardiology.    Morbid obesity (HCC) -     Comprehensive metabolic panel  BMI 40.0-44.9, adult (HCC) -     Comprehensive metabolic panel         Return in about 4 weeks (around 11/25/2022).[name] He was informed of the importance of frequent follow up visits to maximize his success with intensive lifestyle modifications for his multiple health conditions.   ATTESTASTION STATEMENTS:  Reviewed by clinician on day of visit: allergies, medications, problem list, medical history, surgical history, family history, social history, and previous encounter notes.    Theodis Sato. Ajai Harville FNP-C

## 2022-10-29 LAB — COMPREHENSIVE METABOLIC PANEL
ALT: 23 IU/L (ref 0–44)
AST: 23 IU/L (ref 0–40)
Albumin/Globulin Ratio: 1.7 (ref 1.2–2.2)
Albumin: 4.4 g/dL (ref 3.8–4.9)
Alkaline Phosphatase: 74 IU/L (ref 44–121)
BUN/Creatinine Ratio: 25 — ABNORMAL HIGH (ref 9–20)
BUN: 23 mg/dL (ref 6–24)
Bilirubin Total: 0.5 mg/dL (ref 0.0–1.2)
CO2: 22 mmol/L (ref 20–29)
Calcium: 9.5 mg/dL (ref 8.7–10.2)
Chloride: 107 mmol/L — ABNORMAL HIGH (ref 96–106)
Creatinine, Ser: 0.93 mg/dL (ref 0.76–1.27)
Globulin, Total: 2.6 g/dL (ref 1.5–4.5)
Glucose: 150 mg/dL — ABNORMAL HIGH (ref 70–99)
Potassium: 5.1 mmol/L (ref 3.5–5.2)
Sodium: 146 mmol/L — ABNORMAL HIGH (ref 134–144)
Total Protein: 7 g/dL (ref 6.0–8.5)
eGFR: 95 mL/min/{1.73_m2} (ref >59)

## 2022-10-29 LAB — HEMOGLOBIN A1C
Est. average glucose Bld gHb Est-mCnc: 146 mg/dL
Hgb A1c MFr Bld: 6.7 % — ABNORMAL HIGH (ref 4.8–5.6)

## 2022-10-29 LAB — LIPID PANEL WITH LDL/HDL RATIO
Cholesterol, Total: 163 mg/dL (ref 100–199)
HDL: 32 mg/dL — ABNORMAL LOW (ref >39)
LDL Chol Calc (NIH): 113 mg/dL — ABNORMAL HIGH (ref 0–99)
LDL/HDL Ratio: 3.5 ratio (ref 0.0–3.6)
Triglycerides: 98 mg/dL (ref 0–149)
VLDL Cholesterol Cal: 18 mg/dL (ref 5–40)

## 2022-10-29 LAB — VITAMIN D 25 HYDROXY (VIT D DEFICIENCY, FRACTURES): Vit D, 25-Hydroxy: 36.3 ng/mL (ref 30.0–100.0)

## 2022-11-19 ENCOUNTER — Encounter: Payer: Self-pay | Admitting: Internal Medicine

## 2022-11-19 ENCOUNTER — Telehealth: Payer: Self-pay

## 2022-11-19 ENCOUNTER — Ambulatory Visit: Payer: BC Managed Care – PPO | Attending: Internal Medicine | Admitting: Internal Medicine

## 2022-11-19 VITALS — BP 146/80 | HR 62 | Ht 72.0 in | Wt 323.0 lb

## 2022-11-19 DIAGNOSIS — I4819 Other persistent atrial fibrillation: Secondary | ICD-10-CM

## 2022-11-19 DIAGNOSIS — I10 Essential (primary) hypertension: Secondary | ICD-10-CM | POA: Diagnosis not present

## 2022-11-19 DIAGNOSIS — I428 Other cardiomyopathies: Secondary | ICD-10-CM | POA: Diagnosis not present

## 2022-11-19 MED ORDER — AMIODARONE HCL 200 MG PO TABS
ORAL_TABLET | ORAL | 0 refills | Status: DC
Start: 2022-11-19 — End: 2022-12-23

## 2022-11-19 NOTE — Progress Notes (Signed)
HPI [name] returns today for followup of his atrial fib. He is a pleasant morbidly obese 62 yo man with HTN, mixed congestive heart failure who was seen by me 12 months ago. In the interim, he has done well and has lost 12 lbs. He denies chest pain or sob. He notes that his bp is still elevated though better at home.  Allergies  Allergen Reactions   Plavix [Clopidogrel Bisulfate] Rash     Current Outpatient Medications  Medication Sig Dispense Refill   acetaminophen (TYLENOL) 500 MG tablet Take 1,000 mg by mouth every 6 (six) hours as needed for moderate pain.     amiodarone (PACERONE) 200 MG tablet Take one tablet by mouth daily Monday through Saturday.  Take 2 tablets by mouth on Sunday. Please keep scheduled appointment for future refills. Thank you. 112 tablet 0   betamethasone dipropionate 0.05 % cream Apply topically 2 (two) times daily as needed.     diltiazem (CARDIZEM CD) 360 MG 24 hr capsule Take 1 capsule (360 mg total) by mouth daily. 90 capsule 3   fenofibrate (TRICOR) 145 MG tablet Take 145 mg by mouth daily.     furosemide (LASIX) 20 MG tablet TAKE 1 TABLET AS NEEDED FOR EXCESS SWELLING/WEIGHT GAIN 2-3LBS PER DAY OR 5LBS PER WEEK 90 tablet 3   ketoconazole (NIZORAL) 2 % cream Apply 1 application topically daily.     lisinopril (ZESTRIL) 40 MG tablet Take 1 tablet (40 mg total) by mouth daily. 90 tablet 3   metoprolol tartrate (LOPRESSOR) 100 MG tablet TAKE 1 TABLET BY MOUTH EVERY MORNING AND 1 AND 1/2 TABLETS EVERY EVENING. 225 tablet 1   omeprazole (PRILOSEC) 40 MG capsule Take 40 mg by mouth daily.      rivaroxaban (XARELTO) 20 MG TABS tablet TAKE ONE TABLET BY MOUTH ONE TIME DAILY WITH SUPPER 90 tablet 1   Semaglutide, 1 MG/DOSE, 4 MG/3ML SOPN Inject 1 mg as directed once a week. 3 mL 0   Semaglutide,0.25 or 0.5MG /DOS, (OZEMPIC, 0.25 OR 0.5 MG/DOSE,) 2 MG/3ML SOPN Inject 0.5 mg into the skin weekly 3 mL 0   tamsulosin (FLOMAX) 0.4 MG CAPS capsule Take 0.4 mg by  mouth at bedtime.     Vitamin D, Ergocalciferol, (DRISDOL) 1.25 MG (50000 UNIT) CAPS capsule Take 1 capsule (50,000 Units total) by mouth every 7 (seven) days. 5 capsule 0   No current facility-administered medications for this visit.     Past Medical History:  Diagnosis Date   Ascending aorta dilatation (HCC)    42mm by echo 11/2020   Back pain    Bradycardia 10/21/2015   Dyslipidemia    Heart burn    High cholesterol    HTN (hypertension)    Joint pain    NICM (nonischemic cardiomyopathy) (HCC)    a. LHC 2002: no CAD, EF 35-40%; b. Echo 2003: EF 60% - tachycardia mediated (AFlutter with RVR).  EF 50% on echo 11/2020   Obesity    Palpitation    Persistent atrial fibrillation (HCC)    CHADS2VASC score is 2.  s/p remote afib and aflutter ablations   Pre-diabetes    Severe obstructive sleep apnea    , CPAP at 14cm H2O   Swelling of lower extremity    Varicose veins     ROS:   All systems reviewed and negative except as noted in the HPI.   Past Surgical History:  Procedure Laterality Date   CARDIAC SURGERY  CARDIOVERSION N/A 09/07/2019   Procedure: CARDIOVERSION;  Surgeon: Parke Poisson, MD;  Location: [facility] ENDOSCOPY;  Service: Cardiovascular;  Laterality: N/A;   COLONOSCOPY       Family History  Problem Relation Age of Onset   Hypertension Mother    Diabetes Mother    High blood pressure Mother    High Cholesterol Mother    Heart disease Mother    Sleep apnea Mother    Hypertension Father    Heart disease Father    High Cholesterol Father      Social History   Socioeconomic History   Marital status: Married    Spouse name: Not on file   Number of children: Not on file   Years of education: Not on file   Highest education level: Not on file  Occupational History   Not on file  Tobacco Use   Smoking status: Never   Smokeless tobacco: Never  Vaping Use   Vaping Use: Never used  Substance and Sexual Activity   Alcohol use: Yes    Alcohol/week:  3.0 standard drinks of alcohol    Types: 1 Standard drinks or equivalent, 1 Cans of beer, 1 Glasses of wine per week    Comment: occassional beer and wine   Drug use: No   Sexual activity: Not on file  Other Topics Concern   Not on file  Social History Narrative   Not on file   Social Determinants of Health   Financial Resource Strain: Not on file  Food Insecurity: Not on file  Transportation Needs: Not on file  Physical Activity: Not on file  Stress: Not on file  Social Connections: Not on file  Intimate Partner Violence: Not on file     BP (!) 146/80   Pulse 62   Ht 6' (1.829 m)   Wt (!) 323 lb (146.5 kg)   SpO2 99%   BMI 43.81 kg/m   Physical Exam:  Well appearing NAD HEENT: Unremarkable Neck:  No JVD, no thyromegally Lymphatics:  No adenopathy Back:  No CVA tenderness Lungs:  Clear HEART:  Regular rate rhythm, no murmurs, no rubs, no clicks Abd:  soft, positive bowel sounds, no organomegally, no rebound, no guarding Ext:  2 plus pulses, no edema, no cyanosis, no clubbing Skin:  No rashes no nodules Neuro:  CN II through XII intact, motor grossly intact  EKG  DEVICE  Normal device function.  See PaceArt for details.   Assess/Plan: 1. Persistent atrial fib - he is maintaining NSR. He will reduce his dose of amiodarone to 200 mg daily 2. Obesity - I encouraged him to lose weight. He has started Ozempic and is down another 17 lbs in the past 3 months. 3. HTN - his bp is up a bit. I also asked him to reduce his salt intake and to lose weight. I will downtitrate his meds as needed once the weight comes off. 4. Chronic systolic heart failure - his symptoms are class 2. He is on good medical therapy. His EF normalized at his last echo a year ago.   Sharlot Gowda Mable Dara,MD

## 2022-11-19 NOTE — Telephone Encounter (Signed)
Called patient to review next appt which is scheduled for November. Last OV in December 2023 was for both OSA and general cardiology, assured patient both could be addressed at his appt in November. Patient denies any current symptoms, problems w/ cPAP or other concerns. Appt info updated.

## 2022-11-19 NOTE — Patient Instructions (Addendum)
Medication Instructions:  Your physician has recommended you make the following change in your medication: Medication dose Decreased:  Amiodarone 200mg .  You will-   Take one tablet by mouth daily; Monday through Sunday. Take with or after meal.,   Lab Work: None ordered.  If you have labs (blood work) drawn today and your tests are completely normal, you will receive your results only by: MyChart Message (if you have MyChart) OR A paper copy in the mail If you have any lab test that is abnormal or we need to change your treatment, we will call you to review the results.  Testing/Procedures: None ordered.  Follow-Up: At [facility], you and your health needs are our priority.  As part of our continuing mission to provide you with exceptional heart care, we have created designated Provider Care Teams.  These Care Teams include your primary Cardiologist (physician) and Advanced Practice Providers (APPs -  Physician Assistants and Nurse Practitioners) who all work together to provide you with the care you need, when you need it.   Your next appointment:   1 year(s)  The format for your next appointment:   In Person  Provider:   Lewayne Bunting, MD{or one of the following Advanced Practice Providers on your designated Care Team:   Francis Dowse, [place] Casimiro Needle "[facility]" [place], [place]

## 2022-11-19 NOTE — Telephone Encounter (Signed)
-----   Message from Eilleen Kempf, RN sent at 11/19/2022  1:36 PM EDT -----  ----- Message ----- From: Silas [place] Sent: 11/19/2022   1:00 PM EDT To: [address] Triage  Pt want to know if he needs a separate appt to see Dr Mayford Knife for his heart issues or seeing her for "sleep" will be fine.  He has a "sleep" follow up appt already scheduled.  He is not having any problems.  Patient saw Dr Ladona Ridgel this morning and wanted to schedule 2 appts with Dr Alvino Chapel for yearly sleep and one for yearly cardiology ck.   Please advise.

## 2022-12-02 ENCOUNTER — Ambulatory Visit: Payer: BC Managed Care – PPO | Admitting: Nurse Practitioner

## 2022-12-02 ENCOUNTER — Encounter: Payer: Self-pay | Admitting: Nurse Practitioner

## 2022-12-02 VITALS — BP 132/72 | HR 64 | Temp 97.9°F | Ht 72.0 in | Wt 307.0 lb

## 2022-12-02 DIAGNOSIS — E1169 Type 2 diabetes mellitus with other specified complication: Secondary | ICD-10-CM | POA: Diagnosis not present

## 2022-12-02 DIAGNOSIS — I10 Essential (primary) hypertension: Secondary | ICD-10-CM | POA: Diagnosis not present

## 2022-12-02 DIAGNOSIS — E559 Vitamin D deficiency, unspecified: Secondary | ICD-10-CM

## 2022-12-02 DIAGNOSIS — E785 Hyperlipidemia, unspecified: Secondary | ICD-10-CM

## 2022-12-02 DIAGNOSIS — Z7985 Long-term (current) use of injectable non-insulin antidiabetic drugs: Secondary | ICD-10-CM

## 2022-12-02 DIAGNOSIS — Z6841 Body Mass Index (BMI) 40.0 and over, adult: Secondary | ICD-10-CM

## 2022-12-02 MED ORDER — VITAMIN D (ERGOCALCIFEROL) 1.25 MG (50000 UNIT) PO CAPS: 50000.0000 [IU] | ORAL_CAPSULE | ORAL | 0 refills | Status: DC

## 2022-12-02 MED ORDER — SEMAGLUTIDE (1 MG/DOSE) 4 MG/3ML ~~LOC~~ SOPN
1.0000 mg | PEN_INJECTOR | SUBCUTANEOUS | 0 refills | Status: DC
Start: 2022-12-02 — End: 2022-12-30

## 2022-12-02 NOTE — Progress Notes (Signed)
Office: [phone]  /  Fax: [phone]  WEIGHT SUMMARY AND BIOMETRICS  Weight Lost Since Last Visit: 9lb  No data recorded  Vitals Temp: 97.9 F (36.6 C) BP: 132/72 Pulse Rate: 64 SpO2: 98 %   Anthropometric Measurements Height: 6' (1.829 m) Weight: (!) 307 lb (139.3 kg) BMI (Calculated): 41.63 Weight at Last Visit: 316lb Weight Lost Since Last Visit: 9lb Starting Weight: 339lb Total Weight Loss (lbs): 32 lb (14.5 kg)   Body Composition  Body Fat %: 43.5 % Fat Mass (lbs): 133.8 lbs Muscle Mass (lbs): 165 lbs Visceral Fat Rating : 28   Other Clinical Data Fasting: No Labs: No Today's Visit #: 11 Starting Date: 01/14/22     HPI  Chief Complaint: OBESITY  Geoffrey Robinson is here to discuss his progress with his obesity treatment plan. He is on the the Category 4 Plan and states he is following his eating plan approximately 50-60 % of the time. He states he is exercising 0 minutes 0 days per week.   Interval History:  Since last office visit he has lost 9 pounds.  He is not skipping meals. Denies polyphagia or cravings.  He is drinking more water daily and 2 sodas daily.    He wants to lose weight to have left knee replacement (<290 to be able to proceed with surgery).  Goal is also to reduce some of his BP meds.  Discussed with cardiology at last visit.    Pharmacotherapy for weight loss: He is not currently taking medications  for medical weight loss.    Previous pharmacotherapy for medical weight loss:  None  Bariatric surgery:  has not had bariatric surgery  Pharmacotherapy for DMT2:  He is currently taking Ozempic 1mg .  Denies side effects.   Last A1c was 6.7 CBGs: Fasting 110-135 Episodes of hypoglycemia: no On ACE  Last eye exam:  Has appt scheduled in Aug for an eye exam  Lab Results  Component Value Date   HGBA1C 6.7 (H) 10/28/2022   HGBA1C 6.7 (H) 06/30/2022   HGBA1C 7.3 (H) 01/14/2022   Lab Results  Component Value Date   LDLCALC 113  (H) 10/28/2022   CREATININE 0.93 10/28/2022    Hypertension Hypertension BP looks better today.  Monitoring BP at home. Saw cardiology last on 11/19/22 Medication(s): lopressor 100mg , lisinopril 40mg  & cardizem CD 360mg .  Denies side effects.   Denies chest pain, palpitations and SOB.  BP Readings from Last 3 Encounters:  12/02/22 132/72  11/19/22 (!) 146/80  10/28/22 (!) 144/80   Lab Results  Component Value Date   CREATININE 0.93 10/28/2022   CREATININE 0.86 06/30/2022   CREATININE 0.81 12/17/2021     Vit D deficiency  He is taking Vit D 50,000 IU weekly.  Denies side effects.  Denies nausea, vomiting or muscle weakness.    Lab Results  Component Value Date   VD25OH 36.3 10/28/2022   VD25OH 24.1 (L) 06/30/2022   VD25OH 26.4 (L) 01/14/2022     Hyperlipidemia Medication(s): Tricor 145mg . Denies side effects.   Took a statin in the past and was stopped by cardiology.  Saw cardiology last on 11/19/22.    Lab Results  Component Value Date   CHOL 163 10/28/2022   HDL 32 (L) 10/28/2022   LDLCALC 113 (H) 10/28/2022   TRIG 98 10/28/2022   CHOLHDL 4.6 04/11/2009   Lab Results  Component Value Date   ALT 23 10/28/2022   AST 23 10/28/2022   ALKPHOS 74 10/28/2022  BILITOT 0.5 10/28/2022   The 10-year ASCVD risk score (Arnett DK, et al., 2019) is: 20.4%   Values used to calculate the score:     Age: 62 years     Sex: Male     Is Non-Hispanic African American: No     Diabetic: Yes     Tobacco smoker: No     Systolic Blood Pressure: 132 mmHg     Is BP treated: Yes     HDL Cholesterol: 32 mg/dL     Total Cholesterol: 163 mg/dL   PHYSICAL EXAM:  Blood pressure 132/72, pulse 64, temperature 97.9 F (36.6 C), height 6' (1.829 m), weight (!) 307 lb (139.3 kg), SpO2 98 %. Body mass index is 41.64 kg/m.  General: He is overweight, cooperative, alert, well developed, and in no acute distress. PSYCH: Has normal mood, affect and thought process.   Extremities: No edema.   Neurologic: No gross sensory or motor deficits. No tremors or fasciculations noted.    DIAGNOSTIC DATA REVIEWED:  BMET    Component Value Date/Time   NA 146 (H) 10/28/2022 0937   K 5.1 10/28/2022 0937   CL 107 (H) 10/28/2022 0937   CO2 22 10/28/2022 0937   GLUCOSE 150 (H) 10/28/2022 0937   GLUCOSE 215 (H) 09/07/2019 0800   BUN 23 10/28/2022 0937   CREATININE 0.93 10/28/2022 0937   CREATININE 0.99 03/11/2016 0853   CALCIUM 9.5 10/28/2022 0937   GFRNONAA >60 08/15/2019 0820   GFRAA >60 08/15/2019 0820   Lab Results  Component Value Date   HGBA1C 6.7 (H) 10/28/2022   HGBA1C  04/10/2009    5.9 (NOTE) The ADA recommends the following therapeutic goal for glycemic control related to Hgb A1c measurement: Goal of therapy: <6.5 Hgb A1c  Reference: American Diabetes Association: Clinical Practice Recommendations 2010, Diabetes Care, 2010, 33: (Suppl  1).   Lab Results  Component Value Date   INSULIN 30.0 (H) 06/30/2022   INSULIN 27.1 (H) 01/14/2022   Lab Results  Component Value Date   TSH 0.488 01/14/2022   CBC    Component Value Date/Time   WBC 5.5 12/17/2021 0845   WBC 5.0 03/11/2016 0853   RBC 4.81 12/17/2021 0845   RBC 4.82 03/11/2016 0853   HGB 13.4 12/17/2021 0845   HCT 40.4 12/17/2021 0845   PLT 219 12/17/2021 0845   MCV 84 12/17/2021 0845   MCH 27.9 12/17/2021 0845   MCH 29.0 03/11/2016 0853   MCHC 33.2 12/17/2021 0845   MCHC 34.2 03/11/2016 0853   RDW 12.9 12/17/2021 0845   Iron Studies No results found for: "IRON", "TIBC", "FERRITIN", "IRONPCTSAT" Lipid Panel     Component Value Date/Time   CHOL 163 10/28/2022 0937   TRIG 98 10/28/2022 0937   HDL 32 (L) 10/28/2022 0937   CHOLHDL 4.6 04/11/2009 0451   VLDL 20 04/11/2009 0451   LDLCALC 113 (H) 10/28/2022 0937   Hepatic Function Panel     Component Value Date/Time   PROT 7.0 10/28/2022 0937   ALBUMIN 4.4 10/28/2022 0937   AST 23 10/28/2022 0937   ALT 23 10/28/2022 0937   ALKPHOS 74 10/28/2022  0937   BILITOT 0.5 10/28/2022 0937      Component Value Date/Time   TSH 0.488 01/14/2022 0902   Nutritional Lab Results  Component Value Date   VD25OH 36.3 10/28/2022   VD25OH 24.1 (L) 06/30/2022   VD25OH 26.4 (L) 01/14/2022     ASSESSMENT AND PLAN  TREATMENT PLAN FOR OBESITY:  Recommended Dietary Goals  Refugio is currently in the action stage of change. As such, his goal is to continue weight management plan. He has agreed to the Category 4 Plan.  Behavioral Intervention  We discussed the following Behavioral Modification Strategies today: increasing lean protein intake, decreasing simple carbohydrates , increasing vegetables, increasing lower glycemic fruits, increasing water intake, work on meal planning and preparation, keeping healthy foods at home, identifying sources and decreasing liquid calories, continue to practice mindfulness when eating, and planning for success.  Additional resources provided today: NA  Recommended Physical Activity Goals  Jatinder has been advised to work up to 150 minutes of moderate intensity aerobic activity a week and strengthening exercises 2-3 times per week for cardiovascular health, weight loss maintenance and preservation of muscle mass.   He has agreed to Think about ways to increase daily physical activity and overcoming barriers to exercise and Increase physical activity in their day and reduce sedentary time (increase NEAT).   ASSOCIATED CONDITIONS ADDRESSED TODAY  Action/Plan  Type 2 diabetes mellitus with obesity (HCC) -     Continue Semaglutide (1 MG/DOSE); Inject 1 mg as directed once a week.  Dispense: 3 mL; Refill: 0.  Side effects discussed.  We discussed increasing to 2mg  but he would like to stay on 1mg  at this tim.    Good blood sugar control is important to decrease the likelihood of diabetic complications such as nephropathy, neuropathy, limb loss, blindness, coronary artery disease, and death. Intensive lifestyle  modification including diet, exercise and weight loss are the first line of treatment for diabetes.    Vitamin D insufficiency -     Vitamin D (Ergocalciferol); Take 1 capsule (50,000 Units total) by mouth every 7 (seven) days.  Dispense: 5 capsule; Refill: 0.  Side effects discussed  Hypertension, unspecified type Continue to follow up with cardiology.  Continue meds as directed.  Will continue to monitor BP at home.   Dyslipidemia Continue to follow up with cardiology and PCP.  Continue meds as directed.   Morbid obesity (HCC)  BMI 40.0-44.9, adult (HCC)      Labs reviewed in chart with patient from 10/28/22   Return in about 4 weeks (around 12/30/2022).[name] He was informed of the importance of frequent follow up visits to maximize his success with intensive lifestyle modifications for his multiple health conditions.   ATTESTASTION STATEMENTS:  Reviewed by clinician on day of visit: allergies, medications, problem list, medical history, surgical history, family history, social history, and previous encounter notes.    Theodis Sato. Javohn Basey FNP-C

## 2022-12-16 ENCOUNTER — Telehealth: Payer: Self-pay | Admitting: Internal Medicine

## 2022-12-16 NOTE — Telephone Encounter (Signed)
Patient scheduled to see Dr. Ladona Ridgel 7/3

## 2022-12-16 NOTE — Telephone Encounter (Signed)
Patient calling in about the change to his medication on his last visit. Please advise

## 2022-12-16 NOTE — Telephone Encounter (Signed)
Patient reports that he has noticed an increased in palpitations since decreasing his amiodarone. He was previously taking 200 mg Monday-Saturday and 400 mg on Sunday. Dose decreased to 200 mg daily. He is still taking cardizem 360 mg daily and metoprolol 100 mg every morning and 150 every evening. He states that he is not symptomatic, he can just feel when he is out of rhythm. He states that it does not last long. He notes that he did get a shot in his knee a couple of weeks ago. Otherwise no changes to anything. He does have an apple watch which I advised him to start using to monitor his heart rhythm. I will make Dr. Ladona Ridgel aware.

## 2022-12-23 ENCOUNTER — Ambulatory Visit: Payer: BC Managed Care – PPO | Attending: Internal Medicine | Admitting: Internal Medicine

## 2022-12-23 ENCOUNTER — Encounter: Payer: Self-pay | Admitting: Internal Medicine

## 2022-12-23 VITALS — BP 136/82 | HR 76 | Ht 72.0 in | Wt 311.6 lb

## 2022-12-23 DIAGNOSIS — I4819 Other persistent atrial fibrillation: Secondary | ICD-10-CM

## 2022-12-23 MED ORDER — AMIODARONE HCL 200 MG PO TABS
ORAL_TABLET | ORAL | 1 refills | Status: DC
Start: 2022-12-23 — End: 2023-04-22

## 2022-12-23 NOTE — Progress Notes (Signed)
HPI Geoffrey Robinson returns today for followup of his atrial fib. He is a pleasant morbidly obese 62 yo man with HTN, mixed congestive heart failure who was seen by me less than 2 months ago. When I saw him last we reduced his amiodarone to 200 mg daily and about a week ago began feeling palpitations especially after knee injections. He returns today for follow-up. In the interim he notes that his palpitations come and go.  Allergies  Allergen Reactions   Plavix [Clopidogrel Bisulfate] Rash     Current Outpatient Medications  Medication Sig Dispense Refill   acetaminophen (TYLENOL) 500 MG tablet Take 1,000 mg by mouth every 6 (six) hours as needed for moderate pain.     amiodarone (PACERONE) 200 MG tablet Take one tablet by mouth daily; Monday through Sunday.  Take with or after meal. 112 tablet 0   diltiazem (CARDIZEM CD) 360 MG 24 hr capsule Take 1 capsule (360 mg total) by mouth daily. 90 capsule 3   fenofibrate (TRICOR) 145 MG tablet Take 145 mg by mouth daily.     furosemide (LASIX) 20 MG tablet TAKE 1 TABLET AS NEEDED FOR EXCESS SWELLING/WEIGHT GAIN 2-3LBS PER DAY OR 5LBS PER WEEK 90 tablet 3   lisinopril (ZESTRIL) 40 MG tablet Take 1 tablet (40 mg total) by mouth daily. 90 tablet 3   metoprolol tartrate (LOPRESSOR) 100 MG tablet TAKE 1 TABLET BY MOUTH EVERY MORNING AND 1 AND 1/2 TABLETS EVERY EVENING. 225 tablet 1   omeprazole (PRILOSEC) 40 MG capsule Take 40 mg by mouth daily.      rivaroxaban (XARELTO) 20 MG TABS tablet TAKE ONE TABLET BY MOUTH ONE TIME DAILY WITH SUPPER 90 tablet 1   Semaglutide, 1 MG/DOSE, 4 MG/3ML SOPN Inject 1 mg as directed once a week. 3 mL 0   tamsulosin (FLOMAX) 0.4 MG CAPS capsule Take 0.4 mg by mouth at bedtime.     Vitamin D, Ergocalciferol, (DRISDOL) 1.25 MG (50000 UNIT) CAPS capsule Take 1 capsule (50,000 Units total) by mouth every 7 (seven) days. 5 capsule 0   betamethasone dipropionate 0.05 % cream Apply topically 2 (two) times daily as needed.  (Patient not taking: Reported on 12/23/2022)     ketoconazole (NIZORAL) 2 % cream Apply 1 application topically daily. (Patient not taking: Reported on 12/23/2022)     No current facility-administered medications for this visit.     Past Medical History:  Diagnosis Date   Ascending aorta dilatation (HCC)    42mm by echo 11/2020   Back pain    Bradycardia 10/21/2015   Dyslipidemia    Heart burn    High cholesterol    HTN (hypertension)    Joint pain    NICM (nonischemic cardiomyopathy) (HCC)    a. LHC 2002: no CAD, EF 35-40%; b. Echo 2003: EF 60% - tachycardia mediated (AFlutter with RVR).  EF 50% on echo 11/2020   Obesity    Palpitation    Persistent atrial fibrillation (HCC)    CHADS2VASC score is 2.  s/p remote afib and aflutter ablations   Pre-diabetes    Severe obstructive sleep apnea    , CPAP at 14cm H2O   Swelling of lower extremity    Varicose veins     ROS:   All systems reviewed and negative except as noted in the HPI.   Past Surgical History:  Procedure Laterality Date   CARDIAC SURGERY     CARDIOVERSION N/A 09/07/2019   Procedure: CARDIOVERSION;  Surgeon: Parke Poisson, MD;  Location: [facility] ENDOSCOPY;  Service: Cardiovascular;  Laterality: N/A;   COLONOSCOPY       Family History  Problem Relation Age of Onset   Hypertension Mother    Diabetes Mother    High blood pressure Mother    High Cholesterol Mother    Heart disease Mother    Sleep apnea Mother    Hypertension Father    Heart disease Father    High Cholesterol Father      Social History   Socioeconomic History   Marital status: Married    Spouse name: Not on file   Number of children: Not on file   Years of education: Not on file   Highest education level: Not on file  Occupational History   Not on file  Tobacco Use   Smoking status: Never   Smokeless tobacco: Never  Vaping Use   Vaping Use: Never used  Substance and Sexual Activity   Alcohol use: Yes    Alcohol/week: 3.0  standard drinks of alcohol    Types: 1 Standard drinks or equivalent, 1 Cans of beer, 1 Glasses of wine per week    Comment: occassional beer and wine   Drug use: No   Sexual activity: Not on file  Other Topics Concern   Not on file  Social History Narrative   Not on file   Social Determinants of Health   Financial Resource Strain: Not on file  Food Insecurity: Not on file  Transportation Needs: Not on file  Physical Activity: Not on file  Stress: Not on file  Social Connections: Not on file  Intimate Partner Violence: Not on file     BP 136/82 (BP Location: Left Arm, Patient Position: Sitting, Cuff Size: Normal)   Pulse 76   Ht 6' (1.829 m)   Wt (!) 311 lb 9.6 oz (141.3 kg)   SpO2 97%   BMI 42.26 kg/m   Physical Exam:  Well appearing NAD HEENT: Unremarkable Neck:  No JVD, no thyromegally Lymphatics:  No adenopathy Back:  No CVA tenderness Lungs:  Clear with no wheezes HEART:  Regular rate rhythm, no murmurs, no rubs, no clicks Abd:  soft, positive bowel sounds, no organomegally, no rebound, no guarding Ext:  2 plus pulses, no edema, no cyanosis, no clubbing Skin:  No rashes no nodules Neuro:  CN II through XII intact, motor grossly intact  EKG - atypical atrial flutter   Assess/Plan: 1. Persistent atrial fib - he is back in atypical flutter. I have discussed the treatment options with the patient. I will increase back his dose to 200 mg daily, 400 mg on Sunday, and once he gets down to 275, refer him for another afib ablation.  I'll see him back in October with plans for DCCV if not back in rhythm. 2. Obesity - I encouraged him to lose weight. He has started Ozempic and is down another 17 lbs in the past 3 months. Since I saw him last he has lost another 15 lbs. About 30 in 5 months.  I'll have him back for a nurse visit ECG in 3 weeks and if he remains out of rhythm will schedule DCCV. 3. HTN - his bp is up a bit. I also asked him to reduce his salt intake and to  lose weight. I will downtitrate his meds as needed once the weight comes off. 4. Chronic systolic heart failure - his symptoms are class 2. He is on good medical  therapy. His EF normalized at his last echo a year ago.   Sharlot Gowda Naman Spychalski,MD

## 2022-12-23 NOTE — Patient Instructions (Addendum)
Medication Instructions:   Increase Amiodarone to 200mg  daily except take 400mg  on Sunday  Continue all other medications.     Labwork:  none  Testing/Procedures:  none  Follow-Up:  3 months - October   Any Other Special Instructions Will Be Listed Below (If Applicable).  Nurse visit in 3 weeks for EKG   If you need a refill on your cardiac medications before your next appointment, please call your pharmacy.

## 2022-12-30 ENCOUNTER — Encounter: Payer: Self-pay | Admitting: Nurse Practitioner

## 2022-12-30 ENCOUNTER — Ambulatory Visit: Payer: BC Managed Care – PPO | Admitting: Nurse Practitioner

## 2022-12-30 VITALS — BP 133/79 | HR 74 | Temp 97.8°F | Ht 72.0 in | Wt 303.0 lb

## 2022-12-30 DIAGNOSIS — E669 Obesity, unspecified: Secondary | ICD-10-CM

## 2022-12-30 DIAGNOSIS — Z7985 Long-term (current) use of injectable non-insulin antidiabetic drugs: Secondary | ICD-10-CM

## 2022-12-30 DIAGNOSIS — E1169 Type 2 diabetes mellitus with other specified complication: Secondary | ICD-10-CM

## 2022-12-30 DIAGNOSIS — Z6841 Body Mass Index (BMI) 40.0 and over, adult: Secondary | ICD-10-CM

## 2022-12-30 MED ORDER — SEMAGLUTIDE (1 MG/DOSE) 4 MG/3ML ~~LOC~~ SOPN: 1.0000 mg | PEN_INJECTOR | SUBCUTANEOUS | 0 refills | Status: DC

## 2022-12-30 NOTE — Progress Notes (Signed)
Office: [phone]  /  Fax: [phone]  WEIGHT SUMMARY AND BIOMETRICS  Weight Lost Since Last Visit: 4lb  Weight Gained Since Last Visit: 0lb   Vitals Temp: 97.8 F (36.6 C) BP: 133/79 Pulse Rate: 74 SpO2: 97 %   Anthropometric Measurements Height: 6' (1.829 m) Weight: (!) 303 lb (137.4 kg) BMI (Calculated): 41.09 Weight at Last Visit: 307lb Weight Lost Since Last Visit: 4lb Weight Gained Since Last Visit: 0lb Starting Weight: 339lb Total Weight Loss (lbs): 36 lb (16.3 kg)   Body Composition  Body Fat %: 43.3 % Fat Mass (lbs): 131.2 lbs Muscle Mass (lbs): 163.6 lbs Visceral Fat Rating : 27   Other Clinical Data Fasting: No Labs: no Today's Visit #: 12 Starting Date: 01/14/22     HPI  Chief Complaint: OBESITY  Roverto is here to discuss his progress with his obesity treatment plan. He is on the the Category 4 Plan and states he is following his eating plan approximately 50 % of the time. He states he is exercising 20-30 minutes 3 days per week.   Interval History:  Since last office visit he has lost 4 pounds.  He is watching his portion sizes and making better healthier choices.  He is drinking more water and recently started swimming.  Denies polyphagia and cravings.    He wants to lose weight to have left knee replacement (<290 to be able to proceed with surgery).    He saw cardiology last on 12/23/22:  notes copied from cardiology notes on 12/23/22  "Assess/Plan: 1. Persistent atrial fib - he is back in atypical flutter. I have discussed the treatment options with the patient. I will increase back his dose to 200 mg daily, 400 mg on Sunday, and once he gets down to 275, refer him for another afib ablation.  I'll see him back in October with plans for DCCV if not back in rhythm. 2. Obesity - I encouraged him to lose weight. He has started Ozempic and is down another 17 lbs in the past 3 months. Since I saw him last he has lost another 15 lbs. About 30  in 5 months.  I'll have him back for a nurse visit ECG in 3 weeks and if he remains out of rhythm will schedule DCCV. 3. HTN - his bp is up a bit. I also asked him to reduce his salt intake and to lose weight. I will downtitrate his meds as needed once the weight comes off. 4. Chronic systolic heart failure - his symptoms are class 2. He is on good medical therapy. His EF normalized at his last echo a year ago.   Sharlot Gowda Taylor,MD"   Pharmacotherapy for weight loss: He is not currently taking medications  for medical weight loss.   Previous pharmacotherapy for medical weight loss:  none  Bariatric surgery:  Patient has not had bariatric surgery.  Pharmacotherapy for DMT2:  He is currently taking Ozempic 1mg .  Denies side effects.   Last A1c was 6.7 CBGs: Fasting 120-130 Episodes of hypoglycemia: no On ACE  Last eye exam:  Has appt scheduled in Aug for an eye exam  PHYSICAL EXAM:  Blood pressure 133/79, pulse 74, temperature 97.8 F (36.6 C), height 6' (1.829 m), weight (!) 303 lb (137.4 kg), SpO2 97 %. Body mass index is 41.09 kg/m.  General: He is overweight, cooperative, alert, well developed, and in no acute distress. PSYCH: Has normal mood, affect and thought process.   Extremities: No edema.  Neurologic:  No gross sensory or motor deficits. No tremors or fasciculations noted.    DIAGNOSTIC DATA REVIEWED:  BMET    Component Value Date/Time   NA 146 (H) 10/28/2022 0937   K 5.1 10/28/2022 0937   CL 107 (H) 10/28/2022 0937   CO2 22 10/28/2022 0937   GLUCOSE 150 (H) 10/28/2022 0937   GLUCOSE 215 (H) 09/07/2019 0800   BUN 23 10/28/2022 0937   CREATININE 0.93 10/28/2022 0937   CREATININE 0.99 03/11/2016 0853   CALCIUM 9.5 10/28/2022 0937   GFRNONAA >60 08/15/2019 0820   GFRAA >60 08/15/2019 0820   Lab Results  Component Value Date   HGBA1C 6.7 (H) 10/28/2022   HGBA1C  04/10/2009    5.9 (NOTE) The ADA recommends the following therapeutic goal for glycemic control  related to Hgb A1c measurement: Goal of therapy: <6.5 Hgb A1c  Reference: American Diabetes Association: Clinical Practice Recommendations 2010, Diabetes Care, 2010, 33: (Suppl  1).   Lab Results  Component Value Date   INSULIN 30.0 (H) 06/30/2022   INSULIN 27.1 (H) 01/14/2022   Lab Results  Component Value Date   TSH 0.488 01/14/2022   CBC    Component Value Date/Time   WBC 5.5 12/17/2021 0845   WBC 5.0 03/11/2016 0853   RBC 4.81 12/17/2021 0845   RBC 4.82 03/11/2016 0853   HGB 13.4 12/17/2021 0845   HCT 40.4 12/17/2021 0845   PLT 219 12/17/2021 0845   MCV 84 12/17/2021 0845   MCH 27.9 12/17/2021 0845   MCH 29.0 03/11/2016 0853   MCHC 33.2 12/17/2021 0845   MCHC 34.2 03/11/2016 0853   RDW 12.9 12/17/2021 0845   Iron Studies No results found for: "IRON", "TIBC", "FERRITIN", "IRONPCTSAT" Lipid Panel     Component Value Date/Time   CHOL 163 10/28/2022 0937   TRIG 98 10/28/2022 0937   HDL 32 (L) 10/28/2022 0937   CHOLHDL 4.6 04/11/2009 0451   VLDL 20 04/11/2009 0451   LDLCALC 113 (H) 10/28/2022 0937   Hepatic Function Panel     Component Value Date/Time   PROT 7.0 10/28/2022 0937   ALBUMIN 4.4 10/28/2022 0937   AST 23 10/28/2022 0937   ALT 23 10/28/2022 0937   ALKPHOS 74 10/28/2022 0937   BILITOT 0.5 10/28/2022 0937      Component Value Date/Time   TSH 0.488 01/14/2022 0902   Nutritional Lab Results  Component Value Date   VD25OH 36.3 10/28/2022   VD25OH 24.1 (L) 06/30/2022   VD25OH 26.4 (L) 01/14/2022     ASSESSMENT AND PLAN  TREATMENT PLAN FOR OBESITY:  Recommended Dietary Goals  Greyson is currently in the action stage of change. As such, his goal is to continue weight management plan. He has agreed to the Category 4 Plan.  Behavioral Intervention  We discussed the following Behavioral Modification Strategies today: increasing lean protein intake, decreasing simple carbohydrates , increasing vegetables, increasing lower glycemic fruits,  increasing water intake, keeping healthy foods at home, continue to practice mindfulness when eating, and planning for success.  Additional resources provided today: NA  Recommended Physical Activity Goals  Yaw has been advised to work up to 150 minutes of moderate intensity aerobic activity a week and strengthening exercises 2-3 times per week for cardiovascular health, weight loss maintenance and preservation of muscle mass.   He has agreed to Continue current level of physical activity  and Think about ways to increase daily physical activity and overcoming barriers to exercise   ASSOCIATED CONDITIONS ADDRESSED TODAY  Action/Plan  Type 2 diabetes mellitus with obesity (HCC) -     Semaglutide (1 MG/DOSE); Inject 1 mg as directed once a week.  Dispense: 3 mL; Refill: 0. Side effects discussed.    Morbid obesity (HCC)  BMI 40.0-44.9, adult (HCC)         Return in about 4 weeks (around 01/27/2023).[name] He was informed of the importance of frequent follow up visits to maximize his success with intensive lifestyle modifications for his multiple health conditions.   ATTESTASTION STATEMENTS:  Reviewed by clinician on day of visit: allergies, medications, problem list, medical history, surgical history, family history, social history, and previous encounter notes.     Theodis Sato. Damone Fancher FNP-C

## 2023-01-07 ENCOUNTER — Ambulatory Visit: Payer: BC Managed Care – PPO | Attending: Cardiology | Admitting: *Deleted

## 2023-01-07 DIAGNOSIS — I4819 Other persistent atrial fibrillation: Secondary | ICD-10-CM

## 2023-01-07 NOTE — Progress Notes (Signed)
Pt in office for EKG d/t change in Amiodarone.

## 2023-01-07 NOTE — Patient Instructions (Signed)
Medication Instructions:  Your physician recommends that you continue on your current medications as directed. Please refer to the Current Medication list given to you today.   Labwork: NONE   Testing/Procedures: NONE   Follow-Up: As scheduled   Any Other Special Instructions Will Be Listed Below (If Applicable).     If you need a refill on your cardiac medications before your next appointment, please call your pharmacy.

## 2023-01-17 ENCOUNTER — Other Ambulatory Visit: Payer: Self-pay | Admitting: Internal Medicine

## 2023-01-17 DIAGNOSIS — I4819 Other persistent atrial fibrillation: Secondary | ICD-10-CM

## 2023-01-18 NOTE — Telephone Encounter (Signed)
Prescription refill request for Xarelto received.  Indication:afib Last office visit:7/24 Weight:137.4  kg Age:62 Scr:0.93  5/24 CrCl:166.21  ml/min  Prescription refilled

## 2023-01-21 ENCOUNTER — Ambulatory Visit: Payer: BC Managed Care – PPO | Admitting: Nurse Practitioner

## 2023-01-21 ENCOUNTER — Encounter: Payer: Self-pay | Admitting: Nurse Practitioner

## 2023-01-21 VITALS — BP 131/73 | HR 90 | Temp 97.8°F | Ht 72.0 in | Wt 299.0 lb

## 2023-01-21 DIAGNOSIS — Z6841 Body Mass Index (BMI) 40.0 and over, adult: Secondary | ICD-10-CM | POA: Diagnosis not present

## 2023-01-21 DIAGNOSIS — E559 Vitamin D deficiency, unspecified: Secondary | ICD-10-CM | POA: Diagnosis not present

## 2023-01-21 DIAGNOSIS — E1169 Type 2 diabetes mellitus with other specified complication: Secondary | ICD-10-CM | POA: Diagnosis not present

## 2023-01-21 DIAGNOSIS — Z7985 Long-term (current) use of injectable non-insulin antidiabetic drugs: Secondary | ICD-10-CM

## 2023-01-21 MED ORDER — VITAMIN D (ERGOCALCIFEROL) 1.25 MG (50000 UNIT) PO CAPS
50000.0000 [IU] | ORAL_CAPSULE | ORAL | 0 refills | Status: DC
Start: 2023-01-21 — End: 2023-02-24

## 2023-01-21 MED ORDER — SEMAGLUTIDE (1 MG/DOSE) 4 MG/3ML ~~LOC~~ SOPN
1.0000 mg | PEN_INJECTOR | SUBCUTANEOUS | 0 refills | Status: DC
Start: 2023-01-21 — End: 2023-02-24

## 2023-01-21 NOTE — Progress Notes (Signed)
Office: [phone]  /  Fax: [phone]  WEIGHT SUMMARY AND BIOMETRICS  Weight Lost Since Last Visit: 4lb  Weight Gained Since Last Visit: 0lb   Vitals Temp: 97.8 F (36.6 C) BP: 131/73 Pulse Rate: 90 SpO2: 97 %   Anthropometric Measurements Height: 6' (1.829 m) Weight: 299 lb (135.6 kg) BMI (Calculated): 40.54 Weight at Last Visit: 303lb Weight Lost Since Last Visit: 4lb Weight Gained Since Last Visit: 0lb Starting Weight: 339lb Total Weight Loss (lbs): 40 lb (18.1 kg)   Body Composition  Body Fat %: 42.9 % Fat Mass (lbs): 128.4 lbs Muscle Mass (lbs): 162.6 lbs Visceral Fat Rating : 27   Other Clinical Data Fasting: No Labs: No Today's Visit #: 13 Starting Date: 01/14/22     HPI  Chief Complaint: OBESITY  Geoffrey Robinson is here to discuss his progress with his obesity treatment plan. He is on the the Category 4 Plan and states he is following his eating plan approximately 50 % of the time. He states he is exercising 30 minutes 2 days per week.   Interval History:  Since last office visit he has lost 4 pounds.  Not skipping meals. Feels he's meeting his calories and protein goals. He's drinking more water.  Denies sugary drinks. Drinks 1 diet drink daily.   Denies polyphagia, occ cravings.   His highest weight was 380-390 lbs.   He wants to lose weight to have left knee replacement (<290 to be able to proceed with surgery).    He's going to the beach in 2 weeks.   Pharmacotherapy for weight loss: He is not currently taking medications  for medical weight loss.    Previous pharmacotherapy for medical weight loss:  none  Bariatric surgery:  Patient has not had bariatric surgery   Pharmacotherapy for DMT2:  He is currently taking Ozempic 1mg .  Denies side effects.   Last A1c was 6.7 CBGs: Fasting 118-132  Episodes of hypoglycemia: no On ACE   Last eye exam:  scheduled 01/29/23  Lab Results  Component Value Date   HGBA1C 6.7 (H) 10/28/2022    HGBA1C 6.7 (H) 06/30/2022   HGBA1C 7.3 (H) 01/14/2022   Lab Results  Component Value Date   LDLCALC 113 (H) 10/28/2022   CREATININE 0.93 10/28/2022     Vit D deficiency  He is taking Vit D 50,000 IU weekly.  Denies side effects.  Denies nausea, vomiting or muscle weakness.    Lab Results  Component Value Date   VD25OH 36.3 10/28/2022   VD25OH 24.1 (L) 06/30/2022   VD25OH 26.4 (L) 01/14/2022     PHYSICAL EXAM:  Blood pressure 131/73, pulse 90, temperature 97.8 F (36.6 C), height 6' (1.829 m), weight 299 lb (135.6 kg), SpO2 97%. Body mass index is 40.55 kg/m.  General: He is overweight, cooperative, alert, well developed, and in no acute distress. PSYCH: Has normal mood, affect and thought process.   Extremities: No edema.  Neurologic: No gross sensory or motor deficits. No tremors or fasciculations noted.    DIAGNOSTIC DATA REVIEWED:  BMET    Component Value Date/Time   NA 146 (H) 10/28/2022 0937   K 5.1 10/28/2022 0937   CL 107 (H) 10/28/2022 0937   CO2 22 10/28/2022 0937   GLUCOSE 150 (H) 10/28/2022 0937   GLUCOSE 215 (H) 09/07/2019 0800   BUN 23 10/28/2022 0937   CREATININE 0.93 10/28/2022 0937   CREATININE 0.99 03/11/2016 0853   CALCIUM 9.5 10/28/2022 0937   GFRNONAA >60 08/15/2019  0820   GFRAA >60 08/15/2019 0820   Lab Results  Component Value Date   HGBA1C 6.7 (H) 10/28/2022   HGBA1C  04/10/2009    5.9 (NOTE) The ADA recommends the following therapeutic goal for glycemic control related to Hgb A1c measurement: Goal of therapy: <6.5 Hgb A1c  Reference: American Diabetes Association: Clinical Practice Recommendations 2010, Diabetes Care, 2010, 33: (Suppl  1).   Lab Results  Component Value Date   INSULIN 30.0 (H) 06/30/2022   INSULIN 27.1 (H) 01/14/2022   Lab Results  Component Value Date   TSH 0.488 01/14/2022   CBC    Component Value Date/Time   WBC 5.5 12/17/2021 0845   WBC 5.0 03/11/2016 0853   RBC 4.81 12/17/2021 0845   RBC 4.82  03/11/2016 0853   HGB 13.4 12/17/2021 0845   HCT 40.4 12/17/2021 0845   PLT 219 12/17/2021 0845   MCV 84 12/17/2021 0845   MCH 27.9 12/17/2021 0845   MCH 29.0 03/11/2016 0853   MCHC 33.2 12/17/2021 0845   MCHC 34.2 03/11/2016 0853   RDW 12.9 12/17/2021 0845   Iron Studies No results found for: "IRON", "TIBC", "FERRITIN", "IRONPCTSAT" Lipid Panel     Component Value Date/Time   CHOL 163 10/28/2022 0937   TRIG 98 10/28/2022 0937   HDL 32 (L) 10/28/2022 0937   CHOLHDL 4.6 04/11/2009 0451   VLDL 20 04/11/2009 0451   LDLCALC 113 (H) 10/28/2022 0937   Hepatic Function Panel     Component Value Date/Time   PROT 7.0 10/28/2022 0937   ALBUMIN 4.4 10/28/2022 0937   AST 23 10/28/2022 0937   ALT 23 10/28/2022 0937   ALKPHOS 74 10/28/2022 0937   BILITOT 0.5 10/28/2022 0937      Component Value Date/Time   TSH 0.488 01/14/2022 0902   Nutritional Lab Results  Component Value Date   VD25OH 36.3 10/28/2022   VD25OH 24.1 (L) 06/30/2022   VD25OH 26.4 (L) 01/14/2022     ASSESSMENT AND PLAN  TREATMENT PLAN FOR OBESITY:  Recommended Dietary Goals  Carron is currently in the action stage of change. As such, his goal is to continue weight management plan. He has agreed to the Category 4 Plan.  Behavioral Intervention  We discussed the following Behavioral Modification Strategies today: increasing lean protein intake, decreasing simple carbohydrates , increasing vegetables, increasing lower glycemic fruits, increasing water intake, reading food labels , keeping healthy foods at home, continue to practice mindfulness when eating, and planning for success.  Additional resources provided today: NA  Recommended Physical Activity Goals  Daymian has been advised to work up to 150 minutes of moderate intensity aerobic activity a week and strengthening exercises 2-3 times per week for cardiovascular health, weight loss maintenance and preservation of muscle mass.   He has agreed to  Continue current level of physical activity , Think about ways to increase daily physical activity and overcoming barriers to exercise, and Increase physical activity in their day and reduce sedentary time (increase NEAT).   ASSOCIATED CONDITIONS ADDRESSED TODAY  Action/Plan  Type 2 diabetes mellitus with obesity (HCC) -     continue Semaglutide (1 MG/DOSE); Inject 1 mg as directed once a week.  Dispense: 3 mL; Refill: 0  Good blood sugar control is important to decrease the likelihood of diabetic complications such as nephropathy, neuropathy, limb loss, blindness, coronary artery disease, and death. Intensive lifestyle modification including diet, exercise and weight loss are the first line of treatment for diabetes.  Vitamin D insufficiency -     Vitamin D (Ergocalciferol); Take 1 capsule (50,000 Units total) by mouth every 7 (seven) days.  Dispense: 5 capsule; Refill: 0  Low Vitamin D level contributes to fatigue and are associated with obesity, breast, and colon cancer. He agrees to continue to take prescription Vitamin D @50 ,000 IU every week and will follow-up for routine testing of Vitamin D, at least 2-3 times per year to avoid over-replacement.   Morbid obesity (HCC)  BMI 40.0-44.9, adult (HCC)      Will obtain labs at next visit   Return in about 4 weeks (around 02/18/2023).[name] He was informed of the importance of frequent follow up visits to maximize his success with intensive lifestyle modifications for his multiple health conditions.   ATTESTASTION STATEMENTS:  Reviewed by clinician on day of visit: allergies, medications, problem list, medical history, surgical history, family history, social history, and previous encounter notes.      Theodis Sato. Aster Screws FNP-C

## 2023-01-27 ENCOUNTER — Other Ambulatory Visit: Payer: Self-pay | Admitting: Cardiology

## 2023-01-28 ENCOUNTER — Other Ambulatory Visit (HOSPITAL_COMMUNITY): Payer: Self-pay | Admitting: Cardiology

## 2023-02-24 ENCOUNTER — Ambulatory Visit: Payer: BC Managed Care – PPO | Admitting: Nurse Practitioner

## 2023-02-24 ENCOUNTER — Encounter: Payer: Self-pay | Admitting: Nurse Practitioner

## 2023-02-24 VITALS — BP 134/84 | HR 99 | Temp 97.8°F | Ht 72.0 in | Wt 300.0 lb

## 2023-02-24 DIAGNOSIS — E1169 Type 2 diabetes mellitus with other specified complication: Secondary | ICD-10-CM

## 2023-02-24 DIAGNOSIS — E559 Vitamin D deficiency, unspecified: Secondary | ICD-10-CM | POA: Diagnosis not present

## 2023-02-24 DIAGNOSIS — E785 Hyperlipidemia, unspecified: Secondary | ICD-10-CM

## 2023-02-24 DIAGNOSIS — Z79899 Other long term (current) drug therapy: Secondary | ICD-10-CM

## 2023-02-24 DIAGNOSIS — Z7985 Long-term (current) use of injectable non-insulin antidiabetic drugs: Secondary | ICD-10-CM

## 2023-02-24 DIAGNOSIS — Z6841 Body Mass Index (BMI) 40.0 and over, adult: Secondary | ICD-10-CM

## 2023-02-24 MED ORDER — VITAMIN D (ERGOCALCIFEROL) 1.25 MG (50000 UNIT) PO CAPS
50000.0000 [IU] | ORAL_CAPSULE | ORAL | 0 refills | Status: AC
Start: 2023-02-24 — End: ?

## 2023-02-24 MED ORDER — SEMAGLUTIDE (1 MG/DOSE) 4 MG/3ML ~~LOC~~ SOPN
1.0000 mg | PEN_INJECTOR | SUBCUTANEOUS | 0 refills | Status: AC
Start: 2023-02-24 — End: ?

## 2023-02-24 NOTE — Progress Notes (Signed)
Office: [phone]  /  Fax: [phone]  WEIGHT SUMMARY AND BIOMETRICS  Weight Lost Since Last Visit: 0lb  Weight Gained Since Last Visit: 1lb   Vitals Temp: 97.8 F (36.6 C) BP: 134/84 Pulse Rate: 99 SpO2: 98 %   Anthropometric Measurements Height: 6' (1.829 m) Weight: 300 lb (136.1 kg) BMI (Calculated): 40.68 Weight at Last Visit: 299lb Weight Lost Since Last Visit: 0lb Weight Gained Since Last Visit: 1lb Starting Weight: 339lb Total Weight Loss (lbs): 39 lb (17.7 kg)   Body Composition  Body Fat %: 43.2 % Fat Mass (lbs): 129.8 lbs Muscle Mass (lbs): 162.2 lbs Visceral Fat Rating : 27   Other Clinical Data Fasting: No Labs: No Today's Visit #: 14 Starting Date: 01/14/22     HPI  Chief Complaint: OBESITY  Geoffrey Robinson is here to discuss his progress with his obesity treatment plan. He is on the the Category 4 Plan and states he is following his eating plan approximately 50 % of the time. He states he is exercising 0 minutes 0 days per week.   Interval History:  Since last office visit he has gained 1 pound.  He has been on vacation since his last visit. He's limited on exercising due to knee pain.  He is drinking water, coffee or 1 diet soda daily.    His highest weight was 380-390 lbs.    He wants to lose weight to have left knee replacement (<290 to be able to proceed with surgery).     Pharmacotherapy for weight loss: He is not currently taking medications  for medical weight loss.    Previous pharmacotherapy for medical weight loss:  none  Bariatric surgery:  Has  not had bariatric surgery.   Pharmacotherapy for DMT2:  He is currently taking Ozempic 1mg .  Denies side effects.   Last A1c was 6.7 CBGs: Fasting 110-140 Episodes of hypoglycemia: no On ACE  Last eye exam:  01/2023  Notes occ cravings and polyphagia  Lab Results  Component Value Date   HGBA1C 6.7 (H) 10/28/2022   HGBA1C 6.7 (H) 06/30/2022   HGBA1C 7.3 (H) 01/14/2022   Lab  Results  Component Value Date   LDLCALC 113 (H) 10/28/2022   CREATININE 0.93 10/28/2022    Vit D deficiency  He is taking Vit D 50,000 IU weekly.  Denies side effects.  Denies nausea, vomiting or muscle weakness.    Lab Results  Component Value Date   VD25OH 36.3 10/28/2022   VD25OH 24.1 (L) 06/30/2022   VD25OH 26.4 (L) 01/14/2022    Hyperlipidemia Medication(s): Tricor 145mg . Denies side effects.   Was on statin in the past and was stopped by cardiology.   Has appt scheduled with cardiology on 04/22/23 and 05/11/23   Lab Results  Component Value Date   CHOL 163 10/28/2022   HDL 32 (L) 10/28/2022   LDLCALC 113 (H) 10/28/2022   TRIG 98 10/28/2022   CHOLHDL 4.6 04/11/2009   Lab Results  Component Value Date   ALT 23 10/28/2022   AST 23 10/28/2022   ALKPHOS 74 10/28/2022   BILITOT 0.5 10/28/2022   The 10-year ASCVD risk score (Arnett DK, et al., 2019) is: 20.9%   Values used to calculate the score:     Age: 62 years     Sex: Male     Is Non-Hispanic African American: No     Diabetic: Yes     Tobacco smoker: No     Systolic Blood Pressure: 134 mmHg  Is BP treated: Yes     HDL Cholesterol: 32 mg/dL     Total Cholesterol: 163 mg/dL   PHYSICAL EXAM:  Blood pressure 134/84, pulse 99, temperature 97.8 F (36.6 C), height 6' (1.829 m), weight 300 lb (136.1 kg), SpO2 98%. Body mass index is 40.69 kg/m.  General: He is overweight, cooperative, alert, well developed, and in no acute distress. PSYCH: Has normal mood, affect and thought process.   Extremities: No edema.  Neurologic: No gross sensory or motor deficits. No tremors or fasciculations noted.    DIAGNOSTIC DATA REVIEWED:  BMET    Component Value Date/Time   NA 146 (H) 10/28/2022 0937   K 5.1 10/28/2022 0937   CL 107 (H) 10/28/2022 0937   CO2 22 10/28/2022 0937   GLUCOSE 150 (H) 10/28/2022 0937   GLUCOSE 215 (H) 09/07/2019 0800   BUN 23 10/28/2022 0937   CREATININE 0.93 10/28/2022 0937   CREATININE  0.99 03/11/2016 0853   CALCIUM 9.5 10/28/2022 0937   GFRNONAA >60 08/15/2019 0820   GFRAA >60 08/15/2019 0820   Lab Results  Component Value Date   HGBA1C 6.7 (H) 10/28/2022   HGBA1C  04/10/2009    5.9 (NOTE) The ADA recommends the following therapeutic goal for glycemic control related to Hgb A1c measurement: Goal of therapy: <6.5 Hgb A1c  Reference: American Diabetes Association: Clinical Practice Recommendations 2010, Diabetes Care, 2010, 33: (Suppl  1).   Lab Results  Component Value Date   INSULIN 30.0 (H) 06/30/2022   INSULIN 27.1 (H) 01/14/2022   Lab Results  Component Value Date   TSH 0.488 01/14/2022   CBC    Component Value Date/Time   WBC 5.5 12/17/2021 0845   WBC 5.0 03/11/2016 0853   RBC 4.81 12/17/2021 0845   RBC 4.82 03/11/2016 0853   HGB 13.4 12/17/2021 0845   HCT 40.4 12/17/2021 0845   PLT 219 12/17/2021 0845   MCV 84 12/17/2021 0845   MCH 27.9 12/17/2021 0845   MCH 29.0 03/11/2016 0853   MCHC 33.2 12/17/2021 0845   MCHC 34.2 03/11/2016 0853   RDW 12.9 12/17/2021 0845   Iron Studies No results found for: "IRON", "TIBC", "FERRITIN", "IRONPCTSAT" Lipid Panel     Component Value Date/Time   CHOL 163 10/28/2022 0937   TRIG 98 10/28/2022 0937   HDL 32 (L) 10/28/2022 0937   CHOLHDL 4.6 04/11/2009 0451   VLDL 20 04/11/2009 0451   LDLCALC 113 (H) 10/28/2022 0937   Hepatic Function Panel     Component Value Date/Time   PROT 7.0 10/28/2022 0937   ALBUMIN 4.4 10/28/2022 0937   AST 23 10/28/2022 0937   ALT 23 10/28/2022 0937   ALKPHOS 74 10/28/2022 0937   BILITOT 0.5 10/28/2022 0937      Component Value Date/Time   TSH 0.488 01/14/2022 0902   Nutritional Lab Results  Component Value Date   VD25OH 36.3 10/28/2022   VD25OH 24.1 (L) 06/30/2022   VD25OH 26.4 (L) 01/14/2022     ASSESSMENT AND PLAN  TREATMENT PLAN FOR OBESITY:  Recommended Dietary Goals  Geoffrey Robinson is currently in the action stage of change. As such, his goal is to continue  weight management plan. He has agreed to the Category 4 Plan.  Behavioral Intervention  We discussed the following Behavioral Modification Strategies today: increasing lean protein intake, decreasing simple carbohydrates , increasing vegetables, increasing lower glycemic fruits, increasing water intake, work on meal planning and preparation, reading food labels , keeping healthy foods at home,  continue to practice mindfulness when eating, and planning for success.  Additional resources provided today: NA  Recommended Physical Activity Goals  Almo has been advised to work up to 150 minutes of moderate intensity aerobic activity a week and strengthening exercises 2-3 times per week for cardiovascular health, weight loss maintenance and preservation of muscle mass.   He has agreed to Increase physical activity in their day and reduce sedentary time (increase NEAT).   ASSOCIATED CONDITIONS ADDRESSED TODAY  Action/Plan  Type 2 diabetes mellitus with obesity (HCC) -     Continue semaglutide (1 MG/DOSE); Inject 1 mg as directed once a week.  Dispense: 3 mL; Refill: 0 -     Hemoglobin A1c  Good blood sugar control is important to decrease the likelihood of diabetic complications such as nephropathy, neuropathy, limb loss, blindness, coronary artery disease, and death. Intensive lifestyle modification including diet, exercise and weight loss are the first line of treatment for diabetes.    Vitamin D insufficiency -     Vitamin D (Ergocalciferol); Take 1 capsule (50,000 Units total) by mouth every 7 (seven) days.  Dispense: 5 capsule; Refill: 0 -     VITAMIN D 25 Hydroxy (Vit-D Deficiency, Fractures)  Low Vitamin D level contributes to fatigue and are associated with obesity, breast, and colon cancer. He agrees to continue to take prescription Vitamin D @50 ,000 IU every week and will follow-up for routine testing of Vitamin D, at least 2-3 times per year to avoid over-replacement.    Hyperlipidemia associated with type 2 diabetes mellitus (HCC) -     Lipid Panel With LDL/HDL Ratio  Continue follow-up with PCP.  Continue medications as directed.  Medication management -     Comprehensive metabolic panel  Morbid obesity (HCC) -     TSH  BMI 40.0-44.9, adult (HCC) -     TSH         Return in about 4 weeks (around 03/24/2023).[name] He was informed of the importance of frequent follow up visits to maximize his success with intensive lifestyle modifications for his multiple health conditions.   ATTESTASTION STATEMENTS:  Reviewed by clinician on day of visit: allergies, medications, problem list, medical history, surgical history, family history, social history, and previous encounter notes.     Theodis Sato. Donyetta Ogletree FNP-C

## 2023-02-25 ENCOUNTER — Encounter: Payer: Self-pay | Admitting: Nurse Practitioner

## 2023-02-25 LAB — LIPID PANEL WITH LDL/HDL RATIO
Cholesterol, Total: 162 mg/dL (ref 100–199)
HDL: 34 mg/dL — ABNORMAL LOW (ref >39)
LDL Chol Calc (NIH): 111 mg/dL — ABNORMAL HIGH (ref 0–99)
LDL/HDL Ratio: 3.3 ratio (ref 0.0–3.6)
Triglycerides: 93 mg/dL (ref 0–149)
VLDL Cholesterol Cal: 17 mg/dL (ref 5–40)

## 2023-02-25 LAB — COMPREHENSIVE METABOLIC PANEL
ALT: 17 IU/L (ref 0–44)
AST: 14 IU/L (ref 0–40)
Albumin: 4.2 g/dL (ref 3.8–4.9)
Alkaline Phosphatase: 59 IU/L (ref 44–121)
BUN/Creatinine Ratio: 25 — ABNORMAL HIGH (ref 9–20)
BUN: 26 mg/dL — ABNORMAL HIGH (ref 6–24)
Bilirubin Total: 0.5 mg/dL (ref 0.0–1.2)
CO2: 22 mmol/L (ref 20–29)
Calcium: 9.6 mg/dL (ref 8.7–10.2)
Chloride: 107 mmol/L — ABNORMAL HIGH (ref 96–106)
Creatinine, Ser: 1.05 mg/dL (ref 0.76–1.27)
Globulin, Total: 2.7 g/dL (ref 1.5–4.5)
Glucose: 135 mg/dL — ABNORMAL HIGH (ref 70–99)
Potassium: 5.2 mmol/L (ref 3.5–5.2)
Sodium: 144 mmol/L (ref 134–144)
Total Protein: 6.9 g/dL (ref 6.0–8.5)
eGFR: 82 mL/min/{1.73_m2} (ref >59)

## 2023-02-25 LAB — VITAMIN D 25 HYDROXY (VIT D DEFICIENCY, FRACTURES): Vit D, 25-Hydroxy: 43.1 ng/mL (ref 30.0–100.0)

## 2023-02-25 LAB — HEMOGLOBIN A1C
Est. average glucose Bld gHb Est-mCnc: 143 mg/dL
Hgb A1c MFr Bld: 6.6 % — ABNORMAL HIGH (ref 4.8–5.6)

## 2023-02-25 LAB — TSH: TSH: 0.367 u[IU]/mL — ABNORMAL LOW (ref 0.450–4.500)

## 2023-03-24 ENCOUNTER — Ambulatory Visit: Payer: BC Managed Care – PPO | Admitting: Nurse Practitioner

## 2023-03-24 ENCOUNTER — Encounter: Payer: Self-pay | Admitting: Nurse Practitioner

## 2023-03-24 VITALS — BP 134/77 | HR 85 | Temp 97.9°F | Ht 72.0 in | Wt 299.0 lb

## 2023-03-24 DIAGNOSIS — Z7985 Long-term (current) use of injectable non-insulin antidiabetic drugs: Secondary | ICD-10-CM

## 2023-03-24 DIAGNOSIS — E559 Vitamin D deficiency, unspecified: Secondary | ICD-10-CM

## 2023-03-24 DIAGNOSIS — E0590 Thyrotoxicosis, unspecified without thyrotoxic crisis or storm: Secondary | ICD-10-CM | POA: Diagnosis not present

## 2023-03-24 DIAGNOSIS — E785 Hyperlipidemia, unspecified: Secondary | ICD-10-CM

## 2023-03-24 DIAGNOSIS — Z6841 Body Mass Index (BMI) 40.0 and over, adult: Secondary | ICD-10-CM

## 2023-03-24 DIAGNOSIS — E1169 Type 2 diabetes mellitus with other specified complication: Secondary | ICD-10-CM

## 2023-03-24 MED ORDER — VITAMIN D (ERGOCALCIFEROL) 1.25 MG (50000 UNIT) PO CAPS
50000.0000 [IU] | ORAL_CAPSULE | ORAL | 0 refills | Status: DC
Start: 2023-03-24 — End: 2023-07-08

## 2023-03-24 MED ORDER — SEMAGLUTIDE (1 MG/DOSE) 4 MG/3ML ~~LOC~~ SOPN: 1.0000 mg | PEN_INJECTOR | SUBCUTANEOUS | 0 refills | Status: DC

## 2023-03-24 NOTE — Progress Notes (Signed)
Office: [phone]  /  Fax: [phone]  WEIGHT SUMMARY AND BIOMETRICS  Weight Lost Since Last Visit: 1lb  Weight Gained Since Last Visit: 0lb   Vitals Temp: 97.9 F (36.6 C) BP: 134/77 Pulse Rate: 85 SpO2: 97 %   Anthropometric Measurements Height: 6' (1.829 m) Weight: 299 lb (135.6 kg) BMI (Calculated): 40.54 Weight at Last Visit: 300lb Weight Lost Since Last Visit: 1lb Weight Gained Since Last Visit: 0lb Starting Weight: 339lb Total Weight Loss (lbs): 40 lb (18.1 kg)   Body Composition  Body Fat %: 42.8 % Fat Mass (lbs): 128 lbs Muscle Mass (lbs): 162.8 lbs Visceral Fat Rating : 27   Other Clinical Data Fasting: No Labs: No Today's Visit #: 15 Starting Date: 01/14/22     HPI  Chief Complaint: OBESITY  Geoffrey Robinson is here to discuss his progress with his obesity treatment plan. He is on the the Category 4 Plan and states he is following his eating plan approximately 50 % of the time. He states he is exercising 0 minutes 0 days per week.   Interval History:  Since last office visit he has lost 1 pound.  He is not skipping meals.  He is eating a protein with every meal.  He is drinking water daily.   His highest weight was 380-390 lbs.   He wants to lose weight to have left knee replacement (<290 to be able to proceed with surgery).    Since his last visit he has seen urology for hematuria.  He had a CT and is scheduled to see them today for follow up.    Pharmacotherapy for weight loss: He is not currently taking medications  for medical weight loss.    Previous pharmacotherapy for medical weight loss:  None  Bariatric surgery:  Patient has not had bariatric surgery.  Pharmacotherapy for DMT2:  He is currently taking Ozempic 1mg .  Denies side effects.   Last A1c was 6.6 CBGs: Fasting 110-135  Episodes of hypoglycemia: no On ACE  Last eye exam:  8/24 Rarely notes polyphagia and cravings   Lab Results  Component Value Date   HGBA1C 6.6  (H) 02/24/2023   HGBA1C 6.7 (H) 10/28/2022   HGBA1C 6.7 (H) 06/30/2022   Lab Results  Component Value Date   LDLCALC 111 (H) 02/24/2023   CREATININE 1.05 02/24/2023    Vit D deficiency  He is taking Vit D 50,000 IU weekly.  Denies side effects.  Denies nausea, vomiting or muscle weakness.    Lab Results  Component Value Date   VD25OH 43.1 02/24/2023   VD25OH 36.3 10/28/2022   VD25OH 24.1 (L) 06/30/2022    Hyperlipidemia Medication(s): Tricor 145mg . Denies side effects.   Was on statin in the past and was stopped by cardiology. Has appt scheduled with cardiology on 04/22/23 and 05/11/23   Lab Results  Component Value Date   CHOL 162 02/24/2023   HDL 34 (L) 02/24/2023   LDLCALC 111 (H) 02/24/2023   TRIG 93 02/24/2023   CHOLHDL 4.6 04/11/2009   Lab Results  Component Value Date   ALT 17 02/24/2023   AST 14 02/24/2023   ALKPHOS 59 02/24/2023   BILITOT 0.5 02/24/2023   The 10-year ASCVD risk score (Arnett DK, et al., 2019) is: 20%   Values used to calculate the score:     Age: 22 years     Sex: Male     Is Non-Hispanic African American: No     Diabetic: Yes  Tobacco smoker: No     Systolic Blood Pressure: 134 mmHg     Is BP treated: Yes     HDL Cholesterol: 34 mg/dL     Total Cholesterol: 162 mg/dL   Hyperthyroidism Not on meds.  Has a history of a flutter, HTN, a fib.  Seeing cardiology on 04/22/23 & 05/11/23  PHYSICAL EXAM:  Blood pressure 134/77, pulse 85, temperature 97.9 F (36.6 C), height 6' (1.829 m), weight 299 lb (135.6 kg), SpO2 97%. Body mass index is 40.55 kg/m.  General: He is overweight, cooperative, alert, well developed, and in no acute distress. PSYCH: Has normal mood, affect and thought process.   Extremities: No edema.  Neurologic: No gross sensory or motor deficits. No tremors or fasciculations noted.    DIAGNOSTIC DATA REVIEWED:  BMET    Component Value Date/Time   NA 144 02/24/2023 0907   K 5.2 02/24/2023 0907   CL 107 (H)  02/24/2023 0907   CO2 22 02/24/2023 0907   GLUCOSE 135 (H) 02/24/2023 0907   GLUCOSE 215 (H) 09/07/2019 0800   BUN 26 (H) 02/24/2023 0907   CREATININE 1.05 02/24/2023 0907   CREATININE 0.99 03/11/2016 0853   CALCIUM 9.6 02/24/2023 0907   GFRNONAA >60 08/15/2019 0820   GFRAA >60 08/15/2019 0820   Lab Results  Component Value Date   HGBA1C 6.6 (H) 02/24/2023   HGBA1C  04/10/2009    5.9 (NOTE) The ADA recommends the following therapeutic goal for glycemic control related to Hgb A1c measurement: Goal of therapy: <6.5 Hgb A1c  Reference: American Diabetes Association: Clinical Practice Recommendations 2010, Diabetes Care, 2010, 33: (Suppl  1).   Lab Results  Component Value Date   INSULIN 30.0 (H) 06/30/2022   INSULIN 27.1 (H) 01/14/2022   Lab Results  Component Value Date   TSH 0.367 (L) 02/24/2023   CBC    Component Value Date/Time   WBC 5.5 12/17/2021 0845   WBC 5.0 03/11/2016 0853   RBC 4.81 12/17/2021 0845   RBC 4.82 03/11/2016 0853   HGB 13.4 12/17/2021 0845   HCT 40.4 12/17/2021 0845   PLT 219 12/17/2021 0845   MCV 84 12/17/2021 0845   MCH 27.9 12/17/2021 0845   MCH 29.0 03/11/2016 0853   MCHC 33.2 12/17/2021 0845   MCHC 34.2 03/11/2016 0853   RDW 12.9 12/17/2021 0845   Iron Studies No results found for: "IRON", "TIBC", "FERRITIN", "IRONPCTSAT" Lipid Panel     Component Value Date/Time   CHOL 162 02/24/2023 0907   TRIG 93 02/24/2023 0907   HDL 34 (L) 02/24/2023 0907   CHOLHDL 4.6 04/11/2009 0451   VLDL 20 04/11/2009 0451   LDLCALC 111 (H) 02/24/2023 0907   Hepatic Function Panel     Component Value Date/Time   PROT 6.9 02/24/2023 0907   ALBUMIN 4.2 02/24/2023 0907   AST 14 02/24/2023 0907   ALT 17 02/24/2023 0907   ALKPHOS 59 02/24/2023 0907   BILITOT 0.5 02/24/2023 0907      Component Value Date/Time   TSH 0.367 (L) 02/24/2023 0907   Nutritional Lab Results  Component Value Date   VD25OH 43.1 02/24/2023   VD25OH 36.3 10/28/2022   VD25OH  24.1 (L) 06/30/2022     ASSESSMENT AND PLAN  TREATMENT PLAN FOR OBESITY:  Recommended Dietary Goals  Fayez is currently in the action stage of change. As such, his goal is to continue weight management plan. He has agreed to the Category 4 Plan.  Behavioral Intervention  We  discussed the following Behavioral Modification Strategies today: increasing lean protein intake, decreasing simple carbohydrates , increasing vegetables, increasing lower glycemic fruits, increasing water intake, reading food labels , keeping healthy foods at home, continue to practice mindfulness when eating, and planning for success.  Additional resources provided today: NA  Recommended Physical Activity Goals  Emilo has been advised to work up to 150 minutes of moderate intensity aerobic activity a week and strengthening exercises 2-3 times per week for cardiovascular health, weight loss maintenance and preservation of muscle mass.   He has agreed to Think about ways to increase daily physical activity and overcoming barriers to exercise, Increase physical activity in their day and reduce sedentary time (increase NEAT)., and Work on scheduling and tracking physical activity.     ASSOCIATED CONDITIONS ADDRESSED TODAY  Action/Plan  Type 2 diabetes mellitus with obesity (HCC) -     Semaglutide (1 MG/DOSE); Inject 1 mg as directed once a week.  Dispense: 3 mL; Refill: 0  Good blood sugar control is important to decrease the likelihood of diabetic complications such as nephropathy, neuropathy, limb loss, blindness, coronary artery disease, and death. Intensive lifestyle modification including diet, exercise and weight loss are the first line of treatment for diabetes.    Hyperlipidemia associated with type 2 diabetes mellitus (HCC) Continue to follow up with PCP and cardiology.  Continue meds as directed  Vitamin D insufficiency -     Vitamin D (Ergocalciferol); Take 1 capsule (50,000 Units total) by  mouth every 7 (seven) days.  Dispense: 5 capsule; Refill: 0  Hyperthyroidism -     TSH -     T4, free -     T3  Morbid obesity (HCC)  BMI 40.0-44.9, adult (HCC)       Labs reviewed in chart with patient from 02/24/23  Return in about 4 weeks (around 04/21/2023).[name] He was informed of the importance of frequent follow up visits to maximize his success with intensive lifestyle modifications for his multiple health conditions.   ATTESTASTION STATEMENTS:  Reviewed by clinician on day of visit: allergies, medications, problem list, medical history, surgical history, family history, social history, and previous encounter notes.     Theodis Sato. Itha Kroeker FNP-C

## 2023-03-25 ENCOUNTER — Encounter: Payer: Self-pay | Admitting: Nurse Practitioner

## 2023-03-25 LAB — TSH: TSH: 0.436 u[IU]/mL — ABNORMAL LOW (ref 0.450–4.500)

## 2023-03-25 LAB — T4, FREE: Free T4: 1.8 ng/dL — ABNORMAL HIGH (ref 0.82–1.77)

## 2023-03-25 LAB — T3: T3, Total: 109 ng/dL (ref 71–180)

## 2023-04-17 ENCOUNTER — Other Ambulatory Visit: Payer: Self-pay | Admitting: Nurse Practitioner

## 2023-04-17 DIAGNOSIS — E1169 Type 2 diabetes mellitus with other specified complication: Secondary | ICD-10-CM

## 2023-04-20 ENCOUNTER — Telehealth (INDEPENDENT_AMBULATORY_CARE_PROVIDER_SITE_OTHER): Payer: Self-pay | Admitting: Nurse Practitioner

## 2023-04-20 DIAGNOSIS — E1169 Type 2 diabetes mellitus with other specified complication: Secondary | ICD-10-CM

## 2023-04-20 MED ORDER — SEMAGLUTIDE (1 MG/DOSE) 4 MG/3ML ~~LOC~~ SOPN: 1.0000 mg | PEN_INJECTOR | SUBCUTANEOUS | 0 refills | Status: DC

## 2023-04-20 NOTE — Telephone Encounter (Signed)
Medication sent in. Patients wife informed.

## 2023-04-20 NOTE — Telephone Encounter (Signed)
Pt called stating that he is out of Ozempic and is due for his next dose on this Thursday. Pt states that he cannot come in for an appt this week. He is scheduled for an appt next week. Please call pt at the number on file.

## 2023-04-22 ENCOUNTER — Ambulatory Visit: Payer: BC Managed Care – PPO | Attending: Internal Medicine | Admitting: Internal Medicine

## 2023-04-22 ENCOUNTER — Ambulatory Visit (INDEPENDENT_AMBULATORY_CARE_PROVIDER_SITE_OTHER): Payer: BC Managed Care – PPO

## 2023-04-22 ENCOUNTER — Encounter: Payer: Self-pay | Admitting: Internal Medicine

## 2023-04-22 VITALS — BP 138/86 | HR 84 | Ht 72.0 in | Wt 310.4 lb

## 2023-04-22 DIAGNOSIS — I10 Essential (primary) hypertension: Secondary | ICD-10-CM

## 2023-04-22 DIAGNOSIS — I4819 Other persistent atrial fibrillation: Secondary | ICD-10-CM

## 2023-04-22 DIAGNOSIS — R0602 Shortness of breath: Secondary | ICD-10-CM

## 2023-04-22 DIAGNOSIS — Z79899 Other long term (current) drug therapy: Secondary | ICD-10-CM | POA: Diagnosis not present

## 2023-04-22 MED ORDER — DIGOXIN 125 MCG PO TABS: 0.1250 mg | ORAL_TABLET | Freq: Every day | ORAL | 3 refills | Status: DC

## 2023-04-22 NOTE — Progress Notes (Signed)
HPI Mr. Sarsfield returns today for followup of his atrial fib. He is a pleasant morbidly obese 62 yo man with HTN, mixed congestive heart failure who was seen by me almost 4 months ago. In the interim he notes some palpitations. He has not lost anymore weight. He is down almost 60 lbs. He denies chest pain. He is limited by instability in his knees. He has been found to have some thyroid hormone elevation. His TSH is down.  Allergies  Allergen Reactions   Plavix [Clopidogrel Bisulfate] Rash     Current Outpatient Medications  Medication Sig Dispense Refill   acetaminophen (TYLENOL) 500 MG tablet Take 1,000 mg by mouth every 6 (six) hours as needed for moderate pain.     amiodarone (PACERONE) 200 MG tablet Take one tablet by mouth daily (Monday - Saturday) except take 400mg  on Sunday's 96 tablet 1   diltiazem (CARDIZEM CD) 360 MG 24 hr capsule TAKE 1 CAPSULE BY MOUTH EVERY DAY 90 capsule 0   fenofibrate (TRICOR) 145 MG tablet Take 145 mg by mouth daily.     finasteride (PROSCAR) 5 MG tablet Take 5 mg by mouth daily.     furosemide (LASIX) 20 MG tablet TAKE 1 TABLET AS NEEDED FOR EXCESS SWELLING/WEIGHT GAIN 2-3LBS PER DAY OR 5LBS PER WEEK 90 tablet 3   lisinopril (ZESTRIL) 40 MG tablet Take 1 tablet (40 mg total) by mouth daily. 90 tablet 3   metoprolol tartrate (LOPRESSOR) 100 MG tablet TAKE 1 TABLET BY MOUTH EVERY MORNING AND 1&1/2 TABLETS EVERY EVENING 225 tablet 1   omeprazole (PRILOSEC) 40 MG capsule Take 40 mg by mouth daily.      Semaglutide, 1 MG/DOSE, 4 MG/3ML SOPN Inject 1 mg as directed once a week. 3 mL 0   tamsulosin (FLOMAX) 0.4 MG CAPS capsule Take 0.4 mg by mouth at bedtime.     Vitamin D, Ergocalciferol, (DRISDOL) 1.25 MG (50000 UNIT) CAPS capsule Take 1 capsule (50,000 Units total) by mouth every 7 (seven) days. 5 capsule 0   XARELTO 20 MG TABS tablet TAKE ONE TABLET BY MOUTH ONE TIME DAILY WITH SUPPER 90 tablet 1   No current facility-administered medications for this  visit.     Past Medical History:  Diagnosis Date   Ascending aorta dilatation (HCC)    42mm by echo 11/2020   Back pain    Bradycardia 10/21/2015   Dyslipidemia    Heart burn    High cholesterol    HTN (hypertension)    Joint pain    NICM (nonischemic cardiomyopathy) (HCC)    a. LHC 2002: no CAD, EF 35-40%; b. Echo 2003: EF 60% - tachycardia mediated (AFlutter with RVR).  EF 50% on echo 11/2020   Obesity    Palpitation    Persistent atrial fibrillation (HCC)    CHADS2VASC score is 2.  s/p remote afib and aflutter ablations   Pre-diabetes    Severe obstructive sleep apnea    , CPAP at 14cm H2O   Swelling of lower extremity    Varicose veins     ROS:   All systems reviewed and negative except as noted in the HPI.   Past Surgical History:  Procedure Laterality Date   CARDIAC SURGERY     CARDIOVERSION N/A 09/07/2019   Procedure: CARDIOVERSION;  Surgeon: Parke Poisson, MD;  Location: [facility] ENDOSCOPY;  Service: Cardiovascular;  Laterality: N/A;   COLONOSCOPY       Family History  Problem Relation Age  of Onset   Hypertension Mother    Diabetes Mother    High blood pressure Mother    High Cholesterol Mother    Heart disease Mother    Sleep apnea Mother    Hypertension Father    Heart disease Father    High Cholesterol Father      Social History   Socioeconomic History   Marital status: Married    Spouse name: Not on file   Number of children: Not on file   Years of education: Not on file   Highest education level: Not on file  Occupational History   Not on file  Tobacco Use   Smoking status: Never   Smokeless tobacco: Never  Vaping Use   Vaping status: Never Used  Substance and Sexual Activity   Alcohol use: Yes    Alcohol/week: 3.0 standard drinks of alcohol    Types: 1 Standard drinks or equivalent, 1 Cans of beer, 1 Glasses of wine per week    Comment: occassional beer and wine   Drug use: No   Sexual activity: Not on file  Other Topics Concern    Not on file  Social History Narrative   Not on file   Social Determinants of Health   Financial Resource Strain: Not on file  Food Insecurity: Low Risk  (03/11/2023)   Received from [facility]   Hunger Vital Sign    Worried About Running Out of Food in the Last Year: Never true    Ran Out of Food in the Last Year: Never true  Transportation Needs: No Transportation Needs (03/11/2023)   Received from [facility]    In the past 12 months, has lack of reliable transportation kept you from medical appointments, meetings, work or from getting things needed for daily living? : No  Physical Activity: Not on file  Stress: Not on file  Social Connections: Not on file  Intimate Partner Violence: Not on file     BP 138/86 (BP Location: Left Arm, Patient Position: Sitting, Cuff Size: Large)   Pulse 84   Ht 6' (1.829 m)   Wt (!) 310 lb 6.4 oz (140.8 kg)   SpO2 97%   BMI 42.10 kg/m   Physical Exam:  Well appearing NAD HEENT: Unremarkable Neck:  No JVD, no thyromegally Lymphatics:  No adenopathy Back:  No CVA tenderness Lungs:  Clear with no wheezes HEART:  IRegular rate rhythm, no murmurs, no rubs, no clicks Abd:  soft, positive bowel sounds, no organomegally, no rebound, no guarding Ext:  2 plus pulses, no edema, no cyanosis, no clubbing Skin:  No rashes no nodules Neuro:  CN II through XII intact, motor grossly intact  Assess/Plan: Atrial fib with a controlled VR - I have recommended he stop amio due to it's ineffectiveness and thyroid dysfunction. I have asked him to start digoxin 0.125 mg daily. We will check a digoxin level after he starts. Also, I will have him wear a 3 day zio once his meds are at steady state.  Obesity - I encouraged him to increase his dose of semiglutide.   Sharlot Gowda Maicie Vanderloop,MD

## 2023-04-22 NOTE — Patient Instructions (Addendum)
Medication Instructions:  Your physician has recommended you make the following change in your medication:  Stop amiodarone. December 1 - Start digoxin 0.125 mg Daily  Lab Work: Digoxin level Mid December  If you have labs (blood work) drawn today and your tests are completely normal, you will receive your results only by: MyChart Message (if you have MyChart) OR A paper copy in the mail If you have any lab test that is abnormal or we need to change your treatment, we will call you to review the results.  Testing/Procedures: Geoffrey Robinson- Long Term Monitor Instructions     Your physician has requested you wear a ZIO patch monitor for 3 days.  This is a single patch monitor. [employer] supplies one patch monitor per enrollment. Additional  stickers are not available. Please do not apply patch if you will be having a Nuclear Stress Test,  Echocardiogram, Cardiac CT, MRI, or Chest Xray during the period you would be wearing the  monitor. The patch cannot be worn during these tests. You cannot remove and re-apply the  ZIO XT patch monitor.  Your ZIO patch monitor will be mailed 3 day USPS to your address on file. It may take 3-5 days  to receive your monitor after you have been enrolled.  Once you have received your monitor, please review the enclosed instructions. Your monitor  has already been registered assigning a specific monitor serial # to you.     Billing and Patient Assistance Program Information     We have supplied [employer] with any of your insurance information on file for billing purposes.  [employer] offers a sliding scale Patient Assistance Program for patients that do not have  insurance, or whose insurance does not completely cover the cost of the ZIO monitor.  You must apply for the Patient Assistance Program to qualify for this discounted rate.  To apply, please call [employer] at [phone], select option 4, select option 2, ask to apply for  Patient Assistance Program. Geoffrey Robinson  will ask your household income, and how many people  are in your household. They will quote your out-of-pocket cost based on that information.  [employer] will also be able to set up a 18-month, interest-free payment plan if needed.     Applying the monitor     Shave hair from upper left chest.  Hold abrader disc by orange tab. Rub abrader in 40 strokes over the upper left chest as  indicated in your monitor instructions.  Clean area with 4 enclosed alcohol pads. Let dry.  Apply patch as indicated in monitor instructions. Patch will be placed under collarbone on left  side of chest with arrow pointing upward.  Rub patch adhesive wings for 2 minutes. Remove white label marked "1". Remove the white  label marked "2". Rub patch adhesive wings for 2 additional minutes.  While looking in a mirror, press and release button in center of patch. A small green light will  flash 3-4 times. This will be your only indicator that the monitor has been turned on.  Do not shower for the first 24 hours. You may shower after the first 24 hours.  Press the button if you feel a symptom. You will hear a small click. Record Date, Time and  Symptom in the Patient Logbook.  When you are ready to remove the patch, follow instructions on the last 2 pages of Patient  Logbook. Stick patch monitor onto the last page of Patient Logbook.  Place Patient Logbook in  the blue and white box. Use locking tab on box and tape box closed  securely. The blue and white box has prepaid postage on it. Please place it in the mailbox as  soon as possible. Your physician should have your test results approximately 7 days after the  monitor has been mailed back to [facility].  Call [facility] Customer Care at [phone] if you have questions regarding  your ZIO XT patch monitor. Call them immediately if you see an orange light blinking on your  monitor.  If your monitor falls off in less than 4 days, contact our Monitor  department at [phone].  If your monitor becomes loose or falls off after 4 days call [employer] at [phone] for  suggestions on securing your monitor.    Follow-Up: At [facility], you and your health needs are our priority.  As part of our continuing mission to provide you with exceptional heart care, we have created designated Provider Care Teams.  These Care Teams include your primary Cardiologist (physician) and Advanced Practice Providers (APPs -  Physician Assistants and Nurse Practitioners) who all work together to provide you with the care you need, when you need it.   Your next appointment:   July 07, 2023 in [place] at [place]  The format for your next appointment:   In Person  Provider:   Lewayne Bunting, MD{or one of the following Advanced Practice Providers on your designated Care Team:   Geoffrey Robinson, Geoffrey Robinson [address]" [place], [place] Geoffrey Rosier, NP  Important Information About Sugar

## 2023-04-22 NOTE — Progress Notes (Unsigned)
Enrolled for [employer] to mail a ZIO XT long term holter monitor to the patients address on file.  

## 2023-04-25 ENCOUNTER — Other Ambulatory Visit: Payer: Self-pay | Admitting: Cardiology

## 2023-04-26 ENCOUNTER — Ambulatory Visit: Payer: BC Managed Care – PPO | Admitting: Nurse Practitioner

## 2023-04-26 ENCOUNTER — Encounter: Payer: Self-pay | Admitting: Nurse Practitioner

## 2023-04-26 VITALS — BP 134/84 | HR 75 | Temp 97.7°F | Ht 72.0 in | Wt 305.0 lb

## 2023-04-26 DIAGNOSIS — E0590 Thyrotoxicosis, unspecified without thyrotoxic crisis or storm: Secondary | ICD-10-CM

## 2023-04-26 DIAGNOSIS — I4819 Other persistent atrial fibrillation: Secondary | ICD-10-CM

## 2023-04-26 DIAGNOSIS — E1169 Type 2 diabetes mellitus with other specified complication: Secondary | ICD-10-CM | POA: Diagnosis not present

## 2023-04-26 DIAGNOSIS — E669 Obesity, unspecified: Secondary | ICD-10-CM

## 2023-04-26 DIAGNOSIS — Z6841 Body Mass Index (BMI) 40.0 and over, adult: Secondary | ICD-10-CM

## 2023-04-26 MED ORDER — SEMAGLUTIDE (2 MG/DOSE) 8 MG/3ML ~~LOC~~ SOPN: 2.0000 mg | PEN_INJECTOR | SUBCUTANEOUS | 0 refills | Status: DC

## 2023-04-26 NOTE — Progress Notes (Signed)
Office: [phone]  /  Fax: [phone]  WEIGHT SUMMARY AND BIOMETRICS  Weight Lost Since Last Visit: 0lb  Weight Gained Since Last Visit: 6lb   Vitals Temp: 97.7 F (36.5 C) BP: 134/84 Pulse Rate: 75 SpO2: 100 %   Anthropometric Measurements Height: 6' (1.829 m) Weight: (!) 305 lb (138.3 kg) BMI (Calculated): 41.36 Weight at Last Visit: 299lb Weight Lost Since Last Visit: 0lb Weight Gained Since Last Visit: 6lb Starting Weight: 339lb Total Weight Loss (lbs): 34 lb (15.4 kg)   Body Composition  Body Fat %: 43.6 % Fat Mass (lbs): 133.2 lbs Muscle Mass (lbs): 163.8 lbs Visceral Fat Rating : 28   Other Clinical Data Fasting: Yes Labs: No Today's Visit #: 16 Starting Date: 01/14/22     HPI  Chief Complaint: OBESITY  Geoffrey Robinson is here to discuss his progress with his obesity treatment plan. He is on the the Category 4 Plan and states he is following his eating plan approximately 50 % of the time. He states he is exercising 0 minutes 0 days per week.   Interval History:  Since last office visit he has gained 6 pounds.  He went on vacation since his last visit.  His wife fell and broke her arm last week.  He is struggling with polyphagia and cravings.    His highest weight was 380-390 lbs.    He wants to lose weight to have left knee replacement (<290 to be able to proceed with surgery).   Pharmacotherapy for weight loss: He is not currently taking medications for medical weight loss.     Previous pharmacotherapy for medical weight loss:  None   Bariatric surgery:  Patient has not had bariatric surgery.  Pharmacotherapy for DMT2:  He is currently taking Ozempic 1mg .  Denies side effects.   Last A1c was 6.6 CBGs: 108-130 Episodes of hypoglycemia: no On ACE.  Last eye exam:  01/2023 Notes polyphagia and cravings.     Lab Results  Component Value Date   HGBA1C 6.6 (H) 02/24/2023   HGBA1C 6.7 (H) 10/28/2022   HGBA1C 6.7 (H) 06/30/2022   Lab  Results  Component Value Date   LDLCALC 111 (H) 02/24/2023   CREATININE 1.05 02/24/2023   A fib Saw cardiology last on 04/22/23.  He stopped his amio and started him on digoxin 0.125mg .  Will wear a 3 day zio once his meds are regulated.   Has follow up appt with Dr. Mayford Knife on 05/11/23 and Dr. Ladona Ridgel on 07/07/23  Hyperthyroidism Saw cardiology last on 04/22/23.  He stopped his amio and started him on digoxin 0.125mg  (to start Dec 1st).  Will wear a 3 day zio once his meds are regulated.   PHYSICAL EXAM:  Blood pressure 134/84, pulse 75, temperature 97.7 F (36.5 C), height 6' (1.829 m), weight (!) 305 lb (138.3 kg), SpO2 100%. Body mass index is 41.37 kg/m.  General: He is overweight, cooperative, alert, well developed, and in no acute distress. PSYCH: Has normal mood, affect and thought process.   Extremities: No edema.  Neurologic: No gross sensory or motor deficits. No tremors or fasciculations noted.    DIAGNOSTIC DATA REVIEWED:  BMET    Component Value Date/Time   NA 144 02/24/2023 0907   K 5.2 02/24/2023 0907   CL 107 (H) 02/24/2023 0907   CO2 22 02/24/2023 0907   GLUCOSE 135 (H) 02/24/2023 0907   GLUCOSE 215 (H) 09/07/2019 0800   BUN 26 (H) 02/24/2023 0907   CREATININE 1.05  02/24/2023 0907   CREATININE 0.99 03/11/2016 0853   CALCIUM 9.6 02/24/2023 0907   GFRNONAA >60 08/15/2019 0820   GFRAA >60 08/15/2019 0820   Lab Results  Component Value Date   HGBA1C 6.6 (H) 02/24/2023   HGBA1C  04/10/2009    5.9 (NOTE) The ADA recommends the following therapeutic goal for glycemic control related to Hgb A1c measurement: Goal of therapy: <6.5 Hgb A1c  Reference: American Diabetes Association: Clinical Practice Recommendations 2010, Diabetes Care, 2010, 33: (Suppl  1).   Lab Results  Component Value Date   INSULIN 30.0 (H) 06/30/2022   INSULIN 27.1 (H) 01/14/2022   Lab Results  Component Value Date   TSH 0.436 (L) 03/24/2023   CBC    Component Value Date/Time    WBC 5.5 12/17/2021 0845   WBC 5.0 03/11/2016 0853   RBC 4.81 12/17/2021 0845   RBC 4.82 03/11/2016 0853   HGB 13.4 12/17/2021 0845   HCT 40.4 12/17/2021 0845   PLT 219 12/17/2021 0845   MCV 84 12/17/2021 0845   MCH 27.9 12/17/2021 0845   MCH 29.0 03/11/2016 0853   MCHC 33.2 12/17/2021 0845   MCHC 34.2 03/11/2016 0853   RDW 12.9 12/17/2021 0845   Iron Studies No results found for: "IRON", "TIBC", "FERRITIN", "IRONPCTSAT" Lipid Panel     Component Value Date/Time   CHOL 162 02/24/2023 0907   TRIG 93 02/24/2023 0907   HDL 34 (L) 02/24/2023 0907   CHOLHDL 4.6 04/11/2009 0451   VLDL 20 04/11/2009 0451   LDLCALC 111 (H) 02/24/2023 0907   Hepatic Function Panel     Component Value Date/Time   PROT 6.9 02/24/2023 0907   ALBUMIN 4.2 02/24/2023 0907   AST 14 02/24/2023 0907   ALT 17 02/24/2023 0907   ALKPHOS 59 02/24/2023 0907   BILITOT 0.5 02/24/2023 0907      Component Value Date/Time   TSH 0.436 (L) 03/24/2023 0905   Nutritional Lab Results  Component Value Date   VD25OH 43.1 02/24/2023   VD25OH 36.3 10/28/2022   VD25OH 24.1 (L) 06/30/2022     ASSESSMENT AND PLAN  TREATMENT PLAN FOR OBESITY:  Recommended Dietary Goals  Geoffrey Robinson is currently in the action stage of change. As such, his goal is to continue weight management plan. He has agreed to the Category 4 Plan.  Behavioral Intervention  We discussed the following Behavioral Modification Strategies today: increasing lean protein intake to established goals, work on meal planning and preparation, reading food labels , keeping healthy foods at home, and continue to work on maintaining a reduced calorie state, getting the recommended amount of protein, incorporating whole foods, making healthy choices, staying well hydrated and practicing mindfulness when eating..  Additional resources provided today: NA  Recommended Physical Activity Goals  [name] has been advised to work up to 150 minutes of moderate  intensity aerobic activity a week and strengthening exercises 2-3 times per week for cardiovascular health, weight loss maintenance and preservation of muscle mass.   He has agreed to Think about enjoyable ways to increase daily physical activity and overcoming barriers to exercise, Increase physical activity in their day and reduce sedentary time (increase NEAT)., and Work on scheduling and tracking physical activity.    ASSOCIATED CONDITIONS ADDRESSED TODAY  Action/Plan  Type 2 diabetes mellitus with obesity (HCC) -     Increase Semaglutide (2 MG/DOSE); Inject 2 mg as directed once a week after finishing 1mg  he has at home.   Dispense: 3 mL; Refill: 0.  Side effects discussed  Persistent atrial fibrillation (HCC) Continue to follow up with cardiology  Hyperthyroidism Will recheck labs in Dec. Continue to follow up with cardiology.   Morbid obesity (HCC)  BMI 40.0-44.9, adult [facility])     Will recheck labs in December.     Return in about 4 weeks (around 05/24/2023).[name] He was informed of the importance of frequent follow up visits to maximize his success with intensive lifestyle modifications for his multiple health conditions.   ATTESTASTION STATEMENTS:  Reviewed by clinician on day of visit: allergies, medications, problem list, medical history, surgical history, family history, social history, and previous encounter notes.     Theodis Sato. Naavya Postma FNP-C

## 2023-05-11 ENCOUNTER — Encounter: Payer: Self-pay | Admitting: Cardiology

## 2023-05-11 ENCOUNTER — Ambulatory Visit: Payer: BC Managed Care – PPO | Attending: Cardiology | Admitting: Cardiology

## 2023-05-11 ENCOUNTER — Other Ambulatory Visit (HOSPITAL_BASED_OUTPATIENT_CLINIC_OR_DEPARTMENT_OTHER): Payer: Self-pay | Admitting: Cardiology

## 2023-05-11 VITALS — BP 142/80 | HR 85 | Ht 72.0 in | Wt 308.4 lb

## 2023-05-11 DIAGNOSIS — I4819 Other persistent atrial fibrillation: Secondary | ICD-10-CM

## 2023-05-11 DIAGNOSIS — I77810 Thoracic aortic ectasia: Secondary | ICD-10-CM

## 2023-05-11 DIAGNOSIS — I428 Other cardiomyopathies: Secondary | ICD-10-CM

## 2023-05-11 DIAGNOSIS — I10 Essential (primary) hypertension: Secondary | ICD-10-CM

## 2023-05-11 DIAGNOSIS — G4733 Obstructive sleep apnea (adult) (pediatric): Secondary | ICD-10-CM | POA: Diagnosis not present

## 2023-05-11 LAB — CBC
Hematocrit: 41.7 % (ref 37.5–51.0)
Hemoglobin: 13.6 g/dL (ref 13.0–17.7)
MCH: 26.9 pg (ref 26.6–33.0)
MCHC: 32.6 g/dL (ref 31.5–35.7)
MCV: 82 fL (ref 79–97)
Platelets: 224 10*3/uL (ref 150–450)
RBC: 5.06 x10E6/uL (ref 4.14–5.80)
RDW: 15.2 % (ref 11.6–15.4)
WBC: 5.4 10*3/uL (ref 3.4–10.8)

## 2023-05-11 NOTE — Progress Notes (Signed)
Date:  05/11/2023   ID:  Geoffrey Robinson, DOB 07/18/1960, MRN [id number]   PCP:  [name], MD  Cardiologist:  [name], MD Electrophysiologist:  Lewayne Bunting, MD   Chief Complaint:  OSA, HTN, Afib  History of Present Illness:    Geoffrey Robinson is a 62 y.o. male with a hx of severe OSA on CPAP, obesity, HTN and permanent atrial fibrillation on chronic anticoagulation.  Unfortunately he went back into afib and was seen back by Dr. Ladona Ridgel.  He apparently had had injections in his knee and was also found to have hyperthyroidism.  His Amio was stopped with plans to see Dr. Ladona Ridgel back in January.  Plan was to start Dig in December and do a Ziopatch to make sure his HR was adequately controlled.   He is here today for followup and is doing well.  He denies any chest pain or pressure, SOB, DOE, PND, orthopnea, LE edema (except when sitting for long periods), dizziness, palpitations or syncope. He is compliant with his meds and is tolerating meds with no SE.    He is doing well with his PAP device and thinks that he has gotten used to it.  He tolerates the mask and feels the pressure is adequate.  Since going on PAP he feels rested in the am and has no significant daytime sleepiness.  He denies any significant mouth or nasal dryness or nasal congestion.  He does not think that he snores.    Prior CV studies:   The following studies were reviewed today:  PAP compliance download, labs from PCP  Past Medical History:  Diagnosis Date   Ascending aorta dilatation (HCC)    42mm by echo 11/2020   Back pain    Bradycardia 10/21/2015   Dyslipidemia    Heart burn    High cholesterol    HTN (hypertension)    Joint pain    NICM (nonischemic cardiomyopathy) (HCC)    a. LHC 2002: no CAD, EF 35-40%; b. Echo 2003: EF 60% - tachycardia mediated (AFlutter with RVR).  EF 50% on echo 11/2020   Obesity    Palpitation    Persistent atrial fibrillation (HCC)    CHADS2VASC score is 2.  s/p  remote afib and aflutter ablations   Pre-diabetes    Severe obstructive sleep apnea    , CPAP at 14cm H2O   Swelling of lower extremity    Varicose veins    Past Surgical History:  Procedure Laterality Date   CARDIAC SURGERY     CARDIOVERSION N/A 09/07/2019   Procedure: CARDIOVERSION;  Surgeon: Parke Poisson, MD;  Location: [facility] ENDOSCOPY;  Service: Cardiovascular;  Laterality: N/A;   COLONOSCOPY       Current Meds  Medication Sig   acetaminophen (TYLENOL) 500 MG tablet Take 1,000 mg by mouth every 6 (six) hours as needed for moderate pain.   diltiazem (CARDIZEM CD) 360 MG 24 hr capsule TAKE 1 CAPSULE BY MOUTH EVERY DAY   fenofibrate (TRICOR) 145 MG tablet Take 145 mg by mouth daily.   finasteride (PROSCAR) 5 MG tablet Take 5 mg by mouth daily.   furosemide (LASIX) 20 MG tablet TAKE 1 TABLET AS NEEDED FOR EXCESS SWELLING/WEIGHT GAIN 2-3LBS PER DAY OR 5LBS PER WEEK   lisinopril (ZESTRIL) 40 MG tablet Take 1 tablet (40 mg total) by mouth daily.   metoprolol tartrate (LOPRESSOR) 100 MG tablet TAKE 1 TABLET BY MOUTH EVERY MORNING AND 1&1/2 TABLETS EVERY EVENING  omeprazole (PRILOSEC) 40 MG capsule Take 40 mg by mouth daily.    Semaglutide, 1 MG/DOSE, 4 MG/3ML SOPN Inject 1 mg as directed once a week.   tamsulosin (FLOMAX) 0.4 MG CAPS capsule Take 0.4 mg by mouth at bedtime.   Vitamin D, Ergocalciferol, (DRISDOL) 1.25 MG (50000 UNIT) CAPS capsule Take 1 capsule (50,000 Units total) by mouth every 7 (seven) days.   XARELTO 20 MG TABS tablet TAKE ONE TABLET BY MOUTH ONE TIME DAILY WITH SUPPER     Allergies:   Plavix [clopidogrel bisulfate]   Social History   Tobacco Use   Smoking status: Never   Smokeless tobacco: Never  Vaping Use   Vaping status: Never Used  Substance Use Topics   Alcohol use: Yes    Alcohol/week: 3.0 standard drinks of alcohol    Types: 1 Standard drinks or equivalent, 1 Cans of beer, 1 Glasses of wine per week    Comment: occassional beer and wine   Drug  use: No     Family Hx: The patient's family history includes Diabetes in his mother; Heart disease in his father and mother; High Cholesterol in his father and mother; High blood pressure in his mother; Hypertension in his father and mother; Sleep apnea in his mother.  ROS:   Please see the history of present illness.     All other systems reviewed and are negative.   Labs/Other Tests and Data Reviewed:    Recent Labs: 02/24/2023: ALT 17; BUN 26; Creatinine, Ser 1.05; Potassium 5.2; Sodium 144 03/24/2023: TSH 0.436   Recent Lipid Panel Lab Results  Component Value Date/Time   CHOL 162 02/24/2023 09:07 AM   TRIG 93 02/24/2023 09:07 AM   HDL 34 (L) 02/24/2023 09:07 AM   CHOLHDL 4.6 04/11/2009 04:51 AM   LDLCALC 111 (H) 02/24/2023 09:07 AM    Wt Readings from Last 3 Encounters:  05/11/23 (!) 308 lb 6.4 oz (139.9 kg)  04/26/23 (!) 305 lb (138.3 kg)  04/22/23 (!) 310 lb 6.4 oz (140.8 kg)     Objective:    Vital Signs:  BP (!) 142/80   Pulse 85   Ht 6' (1.829 m)   Wt (!) 308 lb 6.4 oz (139.9 kg)   SpO2 99%   BMI 41.83 kg/m   GEN: Well nourished, well developed in no acute distress HEENT: Normal NECK: No JVD; No carotid bruits LYMPHATICS: No lymphadenopathy CARDIAC:irregularly irregular, no murmurs, rubs, gallops RESPIRATORY:  Clear to auscultation without rales, wheezing or rhonchi  ABDOMEN: Soft, non-tender, non-distended MUSCULOSKELETAL:  No edema; No deformity marked varicose veins in LE SKIN: Warm and dry NEUROLOGIC:  Alert and oriented x 3 PSYCHIATRIC:  Normal affect  ASSESSMENT & PLAN:    OSA The patient is tolerating PAP therapy well without any problems. The PAP download performed by his DME was personally reviewed and interpreted by me today and showed an AHI of 3.2 /hr on 15 cm H2O with 97 % compliance in using more than 4 hours nightly.  The patient has been using and benefiting from PAP use and will continue to benefit from therapy.   2.  Persistent atrial  fibrillation/atrial flutter -s/p remote aflutter and afib ablations -he went back into atrial flutter back in Dec 2020 and was seen in office and Multaq stopped and started on Amio load and then DCCV >>unfortunately reverted back to afib and was found to have hyperthyroidism and Amio was stopped>>plan was to start dig in December and wear a ziopatch  in January to make sure HR was controlled -Denies any bleeding problems on DOAC -Continue drug management with Cardizem CD3 60 mg daily, Lopressor 100 mg qam and 150mg  qpm and Xarelto 20 mg daily with as needed refills -I have personally reviewed and interpreted outside labs performed by patient's PCP which showed serum creatinine 1.05, potassium 5.2 on 02/24/2023 and hemoglobin 13.4 on 12/17/2021 -Repeat CBC today  3.  HTN -BP controlled on exam today -Continue prescription drug management Cardizem CD 360 mg daily, lisinopril 40 mg daily, Lopressor 100mg  qam and 150mg  qpm with as needed refills.  4.  DCM/Chronic diastolic CHF -EF on echo 06/2019 was 35-40% with global HK  -DCM felt tachy mediated and possibly a component of poorly controlled BP -Repeat 2D echo done 12/2021 showed normal LV function with EF 55 to 60% and mild LVH -He does not hear volume overloaded on exam today -Continue lisinopril 40 mg daily and Lopressor with as needed refills  5.  Dilated ascending aorta -42mm by echo 12/2021 -Repeat echo     Medication Adjustments/Labs and Tests Ordered: Current medicines are reviewed at length with the patient today.  Concerns regarding medicines are outlined above.  Tests Ordered: No orders of the defined types were placed in this encounter.  Medication Changes: No orders of the defined types were placed in this encounter.   Disposition:  Follow up 6 months  Signed, [name], MD  05/11/2023 8:13 AM    [facility]

## 2023-05-11 NOTE — Patient Instructions (Signed)
Medication Instructions:  No changes *If you need a refill on your cardiac medications before your next appointment, please call your pharmacy*   Lab Work: Today: cbc If you have labs (blood work) drawn today and your tests are completely normal, you will receive your results only by: MyChart Message (if you have MyChart) OR A paper copy in the mail If you have any lab test that is abnormal or we need to change your treatment, we will call you to review the results.   Testing/Procedures: Your physician has requested that you have an echocardiogram. Echocardiography is a painless test that uses sound waves to create images of your heart. It provides your doctor with information about the size and shape of your heart and how well your heart's chambers and valves are working. This procedure takes approximately one hour. There are no restrictions for this procedure. Please do NOT wear cologne, perfume, aftershave, or lotions (deodorant is allowed). Please arrive 15 minutes prior to your appointment time.  Please note: We ask at that you not bring children with you during ultrasound (echo/ vascular) testing. Due to room size and safety concerns, children are not allowed in the ultrasound rooms during exams. Our front office staff cannot provide observation of children in our lobby area while testing is being conducted. An adult accompanying a patient to their appointment will only be allowed in the ultrasound room at the discretion of the ultrasound technician under special circumstances. We apologize for any inconvenience.    Follow-Up: At [facility], you and your health needs are our priority.  As part of our continuing mission to provide you with exceptional heart care, we have created designated Provider Care Teams.  These Care Teams include your primary Cardiologist (physician) and Advanced Practice Providers (APPs -  Physician Assistants and Nurse Practitioners) who all work  together to provide you with the care you need, when you need it.  We recommend signing up for the patient portal called "MyChart".  Sign up information is provided on this After Visit Summary.  MyChart is used to connect with patients for Virtual Visits (Telemedicine).  Patients are able to view lab/test results, encounter notes, upcoming appointments, etc.  Non-urgent messages can be sent to your provider as well.   To learn more about what you can do with MyChart, go to [email or web address].    Your next appointment:   12 month(s)  Provider:   [name], MD

## 2023-05-17 ENCOUNTER — Telehealth: Payer: Self-pay

## 2023-05-17 NOTE — Telephone Encounter (Signed)
Call to patient to advise that labs were normal, patient verbalizes understanding to continue current medications.

## 2023-05-17 NOTE — Telephone Encounter (Signed)
-----   Message from [name] sent at 05/13/2023  8:12 AM EST ----- Please let patient know that labs were normal.  Continue current medical therapy.

## 2023-05-24 ENCOUNTER — Other Ambulatory Visit: Payer: Self-pay | Admitting: Nurse Practitioner

## 2023-05-24 ENCOUNTER — Telehealth (INDEPENDENT_AMBULATORY_CARE_PROVIDER_SITE_OTHER): Payer: Self-pay | Admitting: Nurse Practitioner

## 2023-05-24 NOTE — Telephone Encounter (Signed)
Patient called in stating he is out of his Ozempic.He states he takes it on Wednesdays. He has an appointment with Irene Limbo next Wednesday 06/02/23. Patient states Judeth Cornfield was going to increase his dosage with this refill.

## 2023-06-02 ENCOUNTER — Encounter: Payer: Self-pay | Admitting: Nurse Practitioner

## 2023-06-02 ENCOUNTER — Ambulatory Visit: Payer: BC Managed Care – PPO | Admitting: Nurse Practitioner

## 2023-06-02 VITALS — BP 138/83 | HR 81 | Temp 97.5°F | Ht 72.0 in | Wt 306.0 lb

## 2023-06-02 DIAGNOSIS — E1169 Type 2 diabetes mellitus with other specified complication: Secondary | ICD-10-CM | POA: Diagnosis not present

## 2023-06-02 DIAGNOSIS — E669 Obesity, unspecified: Secondary | ICD-10-CM

## 2023-06-02 DIAGNOSIS — E559 Vitamin D deficiency, unspecified: Secondary | ICD-10-CM

## 2023-06-02 DIAGNOSIS — E0590 Thyrotoxicosis, unspecified without thyrotoxic crisis or storm: Secondary | ICD-10-CM

## 2023-06-02 DIAGNOSIS — Z79899 Other long term (current) drug therapy: Secondary | ICD-10-CM

## 2023-06-02 DIAGNOSIS — Z6841 Body Mass Index (BMI) 40.0 and over, adult: Secondary | ICD-10-CM

## 2023-06-02 DIAGNOSIS — Z7985 Long-term (current) use of injectable non-insulin antidiabetic drugs: Secondary | ICD-10-CM

## 2023-06-02 MED ORDER — SEMAGLUTIDE (2 MG/DOSE) 8 MG/3ML ~~LOC~~ SOPN: 2.0000 mg | PEN_INJECTOR | SUBCUTANEOUS | 0 refills | Status: DC

## 2023-06-02 NOTE — Progress Notes (Signed)
Office: [phone]  /  Fax: [phone]  WEIGHT SUMMARY AND BIOMETRICS  Weight Lost Since Last Visit: 0lb  Weight Gained Since Last Visit: 1lb   Vitals Temp: (!) 97.5 F (36.4 C) BP: 138/83 Pulse Rate: 81 SpO2: 98 %   Anthropometric Measurements Height: 6' (1.829 m) Weight: (!) 306 lb (138.8 kg) BMI (Calculated): 41.49 Weight at Last Visit: 305lb Weight Lost Since Last Visit: 0lb Weight Gained Since Last Visit: 1lb Starting Weight: 339lb Total Weight Loss (lbs): 33 lb (15 kg)   Body Composition  Body Fat %: 43.5 % Fat Mass (lbs): 133.2 lbs Muscle Mass (lbs): 164.6 lbs Visceral Fat Rating : 28   Other Clinical Data Fasting: Yes Labs: Yes Today's Visit #: 17 Starting Date: 01/14/22     HPI  Chief Complaint: OBESITY  Geoffrey Robinson is here to discuss his progress with his obesity treatment plan. He is on the the Category 4 Plan and states he is following his eating plan approximately 50 % of the time. He states he is exercising 0 minutes 0 days per week.   Interval History:  Since last office visit he has gained 1 pound.  His wife had surgery since his last visit and he has been helping care for her.  He has been struggling with polyphagia and cravings but notes that has been decreasing since the Ozempic dose was increased to 2mg .   His highest weight was 380-390 lbs.    He wants to lose weight to have left knee replacement (<290 to be able to proceed with surgery).    Pharmacotherapy for weight loss: He is not currently taking medications for medical weight loss.     Previous pharmacotherapy for medical weight loss:  None   Bariatric surgery:  Patient has not had bariatric surgery.  Pharmacotherapy for DMT2:  He is currently taking Ozempic 2mg  (x1 dose).  Denies side effects.   Last A1c was 6.6 on 02/24/23 CBGs: 120-140s Episodes of hypoglycemia: no On ACE.  Last eye exam:  8/24  Lab Results  Component Value Date   HGBA1C 6.6 (H) 02/24/2023    HGBA1C 6.7 (H) 10/28/2022   HGBA1C 6.7 (H) 06/30/2022   Lab Results  Component Value Date   LDLCALC 111 (H) 02/24/2023   CREATININE 1.05 02/24/2023    Hyperthyroidism Saw cardiology last on 04/22/23.  Cardiology stopped his amio and started him on digoxin 0.125mg  (started Dec 1st).  Will wear a 3 day zio next week.   PHYSICAL EXAM:  Blood pressure 138/83, pulse 81, temperature (!) 97.5 F (36.4 C), height 6' (1.829 m), weight (!) 306 lb (138.8 kg), SpO2 98%. Body mass index is 41.5 kg/m.  General: He is overweight, cooperative, alert, well developed, and in no acute distress. PSYCH: Has normal mood, affect and thought process.   Extremities: No edema.  Neurologic: No gross sensory or motor deficits. No tremors or fasciculations noted.    DIAGNOSTIC DATA REVIEWED:  BMET    Component Value Date/Time   NA 144 02/24/2023 0907   K 5.2 02/24/2023 0907   CL 107 (H) 02/24/2023 0907   CO2 22 02/24/2023 0907   GLUCOSE 135 (H) 02/24/2023 0907   GLUCOSE 215 (H) 09/07/2019 0800   BUN 26 (H) 02/24/2023 0907   CREATININE 1.05 02/24/2023 0907   CREATININE 0.99 03/11/2016 0853   CALCIUM 9.6 02/24/2023 0907   GFRNONAA >60 08/15/2019 0820   GFRAA >60 08/15/2019 0820   Lab Results  Component Value Date   HGBA1C 6.6 (  H) 02/24/2023   HGBA1C  04/10/2009    5.9 (NOTE) The ADA recommends the following therapeutic goal for glycemic control related to Hgb A1c measurement: Goal of therapy: <6.5 Hgb A1c  Reference: American Diabetes Association: Clinical Practice Recommendations 2010, Diabetes Care, 2010, 33: (Suppl  1).   Lab Results  Component Value Date   INSULIN 30.0 (H) 06/30/2022   INSULIN 27.1 (H) 01/14/2022   Lab Results  Component Value Date   TSH 0.436 (L) 03/24/2023   CBC    Component Value Date/Time   WBC 5.4 05/11/2023 0848   WBC 5.0 03/11/2016 0853   RBC 5.06 05/11/2023 0848   RBC 4.82 03/11/2016 0853   HGB 13.6 05/11/2023 0848   HCT 41.7 05/11/2023 0848   PLT 224  05/11/2023 0848   MCV 82 05/11/2023 0848   MCH 26.9 05/11/2023 0848   MCH 29.0 03/11/2016 0853   MCHC 32.6 05/11/2023 0848   MCHC 34.2 03/11/2016 0853   RDW 15.2 05/11/2023 0848   Iron Studies No results found for: "IRON", "TIBC", "FERRITIN", "IRONPCTSAT" Lipid Panel     Component Value Date/Time   CHOL 162 02/24/2023 0907   TRIG 93 02/24/2023 0907   HDL 34 (L) 02/24/2023 0907   CHOLHDL 4.6 04/11/2009 0451   VLDL 20 04/11/2009 0451   LDLCALC 111 (H) 02/24/2023 0907   Hepatic Function Panel     Component Value Date/Time   PROT 6.9 02/24/2023 0907   ALBUMIN 4.2 02/24/2023 0907   AST 14 02/24/2023 0907   ALT 17 02/24/2023 0907   ALKPHOS 59 02/24/2023 0907   BILITOT 0.5 02/24/2023 0907      Component Value Date/Time   TSH 0.436 (L) 03/24/2023 0905   Nutritional Lab Results  Component Value Date   VD25OH 43.1 02/24/2023   VD25OH 36.3 10/28/2022   VD25OH 24.1 (L) 06/30/2022     ASSESSMENT AND PLAN  TREATMENT PLAN FOR OBESITY:  Recommended Dietary Goals  Geoffrey Robinson is currently in the action stage of change. As such, his goal is to continue weight management plan. He has agreed to the Category 4 Plan.  Behavioral Intervention  We discussed the following Behavioral Modification Strategies today: increasing lean protein intake to established goals, increasing water intake , work on meal planning and preparation, reading food labels , celebration eating strategies, and continue to work on maintaining a reduced calorie state, getting the recommended amount of protein, incorporating whole foods, making healthy choices, staying well hydrated and practicing mindfulness when eating..  Additional resources provided today: NA  Recommended Physical Activity Goals  Geoffrey Robinson has been advised to work up to 150 minutes of moderate intensity aerobic activity a week and strengthening exercises 2-3 times per week for cardiovascular health, weight loss maintenance and preservation of  muscle mass.   He has agreed to Think about enjoyable ways to increase daily physical activity and overcoming barriers to exercise and Increase physical activity in their day and reduce sedentary time (increase NEAT).   ASSOCIATED CONDITIONS ADDRESSED TODAY  Action/Plan  Type 2 diabetes mellitus with obesity (HCC) -     Hemoglobin A1c -     Comprehensive metabolic panel -     Semaglutide (2 MG/DOSE); Inject 2 mg as directed once a week.  Dispense: 3 mL; Refill: 0  Hyperthyroidism -     TSH -     T4, free -     T3 -     Comprehensive metabolic panel  Vitamin D insufficiency -  VITAMIN D 25 Hydroxy (Vit-D Deficiency, Fractures)  Medication management -     Comprehensive metabolic panel  Morbid obesity (HCC)  BMI 40.0-44.9, adult (HCC)         Return in about 4 weeks (around 06/30/2023).[name] He was informed of the importance of frequent follow up visits to maximize his success with intensive lifestyle modifications for his multiple health conditions.   ATTESTASTION STATEMENTS:  Reviewed by clinician on day of visit: allergies, medications, problem list, medical history, surgical history, family history, social history, and previous encounter notes.     Theodis Sato. Irianna Gilday FNP-C

## 2023-06-03 LAB — HEMOGLOBIN A1C
Est. average glucose Bld gHb Est-mCnc: 146 mg/dL
Hgb A1c MFr Bld: 6.7 % — ABNORMAL HIGH (ref 4.8–5.6)

## 2023-06-03 LAB — T4, FREE: Free T4: 1.68 ng/dL (ref 0.82–1.77)

## 2023-06-03 LAB — COMPREHENSIVE METABOLIC PANEL
ALT: 18 [IU]/L (ref 0–44)
AST: 16 [IU]/L (ref 0–40)
Albumin: 4.2 g/dL (ref 3.8–4.9)
Alkaline Phosphatase: 71 [IU]/L (ref 44–121)
BUN/Creatinine Ratio: 23 — ABNORMAL HIGH (ref 9–20)
BUN: 20 mg/dL (ref 6–24)
Bilirubin Total: 0.5 mg/dL (ref 0.0–1.2)
CO2: 23 mmol/L (ref 20–29)
Calcium: 9.7 mg/dL (ref 8.7–10.2)
Chloride: 105 mmol/L (ref 96–106)
Creatinine, Ser: 0.86 mg/dL (ref 0.76–1.27)
Globulin, Total: 2.6 g/dL (ref 1.5–4.5)
Glucose: 133 mg/dL — ABNORMAL HIGH (ref 70–99)
Potassium: 4.7 mmol/L (ref 3.5–5.2)
Sodium: 142 mmol/L (ref 134–144)
Total Protein: 6.8 g/dL (ref 6.0–8.5)
eGFR: 100 mL/min/{1.73_m2} (ref >59)

## 2023-06-03 LAB — TSH: TSH: 0.478 u[IU]/mL (ref 0.450–4.500)

## 2023-06-03 LAB — T3: T3, Total: 112 ng/dL (ref 71–180)

## 2023-06-03 LAB — VITAMIN D 25 HYDROXY (VIT D DEFICIENCY, FRACTURES): Vit D, 25-Hydroxy: 40.3 ng/mL (ref 30.0–100.0)

## 2023-06-06 DIAGNOSIS — I4819 Other persistent atrial fibrillation: Secondary | ICD-10-CM | POA: Diagnosis not present

## 2023-06-06 DIAGNOSIS — R0602 Shortness of breath: Secondary | ICD-10-CM | POA: Diagnosis not present

## 2023-06-06 DIAGNOSIS — I10 Essential (primary) hypertension: Secondary | ICD-10-CM

## 2023-06-14 ENCOUNTER — Other Ambulatory Visit: Payer: Self-pay | Admitting: Internal Medicine

## 2023-06-14 ENCOUNTER — Other Ambulatory Visit: Payer: Self-pay | Admitting: Physician Assistant

## 2023-06-14 DIAGNOSIS — I4819 Other persistent atrial fibrillation: Secondary | ICD-10-CM

## 2023-06-14 DIAGNOSIS — Z79899 Other long term (current) drug therapy: Secondary | ICD-10-CM

## 2023-06-15 LAB — DIGOXIN LEVEL: Digoxin, Serum: 0.5 ng/mL (ref 0.5–0.9)

## 2023-06-30 ENCOUNTER — Ambulatory Visit (HOSPITAL_COMMUNITY): Payer: 59 | Attending: Cardiology

## 2023-06-30 DIAGNOSIS — I77810 Thoracic aortic ectasia: Secondary | ICD-10-CM | POA: Diagnosis present

## 2023-06-30 LAB — ECHOCARDIOGRAM COMPLETE
AR max vel: 3.44 cm2
AV Area VTI: 3.99 cm2
AV Area mean vel: 3.69 cm2
AV Mean grad: 2 mm[Hg]
AV Peak grad: 5.2 mm[Hg]
Ao pk vel: 1.14 m/s
Area-P 1/2: 2.64 cm2
P 1/2 time: 907 ms
S' Lateral: 3.75 cm

## 2023-07-03 ENCOUNTER — Other Ambulatory Visit: Payer: Self-pay | Admitting: Internal Medicine

## 2023-07-03 DIAGNOSIS — I4819 Other persistent atrial fibrillation: Secondary | ICD-10-CM

## 2023-07-07 ENCOUNTER — Telehealth: Payer: Self-pay

## 2023-07-07 ENCOUNTER — Ambulatory Visit: Payer: 59 | Attending: Internal Medicine | Admitting: Internal Medicine

## 2023-07-07 VITALS — BP 148/76 | HR 69 | Resp 16 | Ht 72.0 in | Wt 312.2 lb

## 2023-07-07 DIAGNOSIS — I482 Chronic atrial fibrillation, unspecified: Secondary | ICD-10-CM | POA: Diagnosis not present

## 2023-07-07 DIAGNOSIS — I77810 Thoracic aortic ectasia: Secondary | ICD-10-CM

## 2023-07-07 NOTE — Patient Instructions (Signed)
 Medication Instructions:  Your physician recommends that you continue on your current medications as directed. Please refer to the Current Medication list given to you today.  *If you need a refill on your cardiac medications before your next appointment, please call your pharmacy*  Lab Work: None ordered.  If you have labs (blood work) drawn today and your tests are completely normal, you will receive your results only by: MyChart Message (if you have MyChart) OR A paper copy in the mail If you have any lab test that is abnormal or we need to change your treatment, we will call you to review the results.  Testing/Procedures: None ordered.  Follow-Up: At [facility], you and your health needs are our priority.  As part of our continuing mission to provide you with exceptional heart care, we have created designated Provider Care Teams.  These Care Teams include your primary Cardiologist (physician) and Advanced Practice Providers (APPs -  Physician Assistants and Nurse Practitioners) who all work together to provide you with the care you need, when you need it.  We recommend signing up for the patient portal called "MyChart".  Sign up information is provided on this After Visit Summary.  MyChart is used to connect with patients for Virtual Visits (Telemedicine).  Patients are able to view lab/test results, encounter notes, upcoming appointments, etc.  Non-urgent messages can be sent to your provider as well.   To learn more about what you can do with MyChart, go to [email or web address].    Your next appointment:   1 year(s)  The format for your next appointment:   In Person  Provider:   Dr Sena Dam one of the following Advanced Practice Providers on your designated Care Team:   Mertha Abrahams, PA-C Bambi Lever [address] [place], [place] Neda Balk, NP    Important Information About Sugar

## 2023-07-07 NOTE — Telephone Encounter (Signed)
 Call to patient to discuss echo results, advised that Echo showed low normal LVF with EF 50-55% , mildly thickened heart muscle, trivial leakiness of the MV and AV and mildly dilated ascending aorta at 43mm and aortic root 40mm. Dr. Micael Adas recommends gated chest cta at this time, patient verbalizes understanding and agrees to plan.

## 2023-07-07 NOTE — Telephone Encounter (Signed)
-----   Message from Gaylyn Keas sent at 06/30/2023  4:15 PM EST ----- Echo showed low normal LVF with EF 50-55% , mildly thickened heart muscle, trivial leakiness of the MV and AV and mildly dilated ascending aorta at 43mm and aortic root 40mm.  Please get a gated Chest CTA to assess ascending aorta further

## 2023-07-07 NOTE — Progress Notes (Signed)
 HPI [name] returns today for followup of his atrial fib. He is a pleasant morbidly obese 63 yo man with HTN, mixed congestive heart failure who was seen by me almost 4 months ago. In the interim he has done well. He has not lost anymore weight. He is down almost 60 lbs.from his highest weight. He denies chest pain. He is limited by instability in his knees. He has been found to have some thyroid  hormone elevation. His TSH is down. We had him wear a 3 day zio and his VR is well controlled. He thinks that he is better.  Allergies  Allergen Reactions   Plavix [Clopidogrel Bisulfate] Rash     Current Outpatient Medications  Medication Sig Dispense Refill   acetaminophen (TYLENOL) 500 MG tablet Take 1,000 mg by mouth every 6 (six) hours as needed for moderate pain.     digoxin  (LANOXIN ) 0.125 MG tablet Take 1 tablet (0.125 mg total) by mouth daily. 90 tablet 3   diltiazem  (CARDIZEM  CD) 360 MG 24 hr capsule TAKE 1 CAPSULE BY MOUTH EVERY DAY 90 capsule 0   fenofibrate (TRICOR) 145 MG tablet Take 145 mg by mouth daily.     finasteride (PROSCAR) 5 MG tablet Take 5 mg by mouth daily.     furosemide  (LASIX ) 20 MG tablet TAKE 1 TABLET AS NEEDED FOR EXCESS SWELLING/WEIGHT GAIN 2-3LBS PER DAY OR 5LBS PER WEEK 90 tablet 3   lisinopril  (ZESTRIL ) 40 MG tablet Take 1 tablet (40 mg total) by mouth daily. 90 tablet 3   metoprolol  tartrate (LOPRESSOR ) 100 MG tablet TAKE 1 TABLET BY MOUTH EVERY MORNING AND 1&1/2 TABLETS EVERY EVENING 225 tablet 1   omeprazole (PRILOSEC) 40 MG capsule Take 40 mg by mouth daily.      Semaglutide , 2 MG/DOSE, 8 MG/3ML SOPN Inject 2 mg as directed once a week. 3 mL 0   tamsulosin (FLOMAX) 0.4 MG CAPS capsule Take 0.4 mg by mouth at bedtime.     Vitamin D , Ergocalciferol , (DRISDOL ) 1.25 MG (50000 UNIT) CAPS capsule Take 1 capsule (50,000 Units total) by mouth every 7 (seven) days. 5 capsule 0   XARELTO  20 MG TABS tablet TAKE ONE TABLET BY MOUTH ONE TIME DAILY WITH SUPPER 90  tablet 1   No current facility-administered medications for this visit.     Past Medical History:  Diagnosis Date   Ascending aorta dilatation (HCC)    42mm by echo 11/2020   Back pain    Bradycardia 10/21/2015   Dyslipidemia    Heart burn    High cholesterol    HTN (hypertension)    Joint pain    NICM (nonischemic cardiomyopathy) (HCC)    a. LHC 2002: no CAD, EF 35-40%; b. Echo 2003: EF 60% - tachycardia mediated (AFlutter with RVR).  EF 50% on echo 11/2020   Obesity    Palpitation    Persistent atrial fibrillation (HCC)    CHADS2VASC score is 2.  s/p remote afib and aflutter ablations   Pre-diabetes    Severe obstructive sleep apnea    , CPAP at 14cm H2O   Swelling of lower extremity    Varicose veins     ROS:   All systems reviewed and negative except as noted in the HPI.   Past Surgical History:  Procedure Laterality Date   CARDIAC SURGERY     CARDIOVERSION N/A 09/07/2019   Procedure: CARDIOVERSION;  Surgeon: Euell Herrlich, MD;  Location: [facility] ENDOSCOPY;  Service: Cardiovascular;  Laterality: N/A;   COLONOSCOPY       Family History  Problem Relation Age of Onset   Hypertension Mother    Diabetes Mother    High blood pressure Mother    High Cholesterol Mother    Heart disease Mother    Sleep apnea Mother    Hypertension Father    Heart disease Father    High Cholesterol Father      Social History   Socioeconomic History   Marital status: Married    Spouse name: Not on file   Number of children: Not on file   Years of education: Not on file   Highest education level: Not on file  Occupational History   Not on file  Tobacco Use   Smoking status: Never   Smokeless tobacco: Never  Vaping Use   Vaping status: Never Used  Substance and Sexual Activity   Alcohol use: Yes    Alcohol/week: 3.0 standard drinks of alcohol    Types: 1 Standard drinks or equivalent, 1 Cans of beer, 1 Glasses of wine per week    Comment: occassional beer and wine    Drug use: No   Sexual activity: Not on file  Other Topics Concern   Not on file  Social History Narrative   Not on file   Social Drivers of Health   Financial Resource Strain: Not on file  Food Insecurity: Low Risk  (03/11/2023)   Received from [facility]   Hunger Vital Sign    Worried About Running Out of Food in the Last Year: Never true    Ran Out of Food in the Last Year: Never true  Transportation Needs: No Transportation Needs (03/11/2023)   Received from [facility]    In the past 12 months, has lack of reliable transportation kept you from medical appointments, meetings, work or from getting things needed for daily living? : No  Physical Activity: Not on file  Stress: Not on file  Social Connections: Not on file  Intimate Partner Violence: Not on file     BP (!) 148/76 (BP Location: Right Arm, Patient Position: Sitting, Cuff Size: Large)   Pulse 69   Resp 16   Ht 6' (1.829 m)   Wt (!) 312 lb 3.2 oz (141.6 kg)   SpO2 99%   BMI 42.34 kg/m   Physical Exam:  Well appearing NAD HEENT: Unremarkable Neck:  No JVD, no thyromegally Lymphatics:  No adenopathy Back:  No CVA tenderness Lungs:  Clear with no wheezes HEART:  IRegular rate rhythm, no murmurs, no rubs, no clicks Abd:  soft, positive bowel sounds, no organomegally, no rebound, no guarding Ext:  2 plus pulses, no edema, no cyanosis, no clubbing Skin:  No rashes no nodules Neuro:  CN II through XII intact, motor grossly intact  DEVICE  Normal device function.  See PaceArt for details.   Assess/Plan:  Atrial fib with a controlled VR - I have recommended he continue digoxin  0.125 mg daily and metoprolol  100 bid. His rates are controlled and digoxiin was 0.5.  Obesity - I encouraged him to work on this. Sleep apnea - he is tolerating his CPAP fairly well. Non-ischemic CM - he will continue his current meds.   Pete Brand Garvis Downum,MD

## 2023-07-08 ENCOUNTER — Ambulatory Visit (INDEPENDENT_AMBULATORY_CARE_PROVIDER_SITE_OTHER): Payer: 59 | Admitting: Nurse Practitioner

## 2023-07-08 ENCOUNTER — Encounter: Payer: Self-pay | Admitting: Nurse Practitioner

## 2023-07-08 VITALS — BP 135/73 | HR 68 | Temp 97.5°F | Ht 72.0 in | Wt 306.0 lb

## 2023-07-08 DIAGNOSIS — E669 Obesity, unspecified: Secondary | ICD-10-CM

## 2023-07-08 DIAGNOSIS — E559 Vitamin D deficiency, unspecified: Secondary | ICD-10-CM | POA: Diagnosis not present

## 2023-07-08 DIAGNOSIS — Z7985 Long-term (current) use of injectable non-insulin antidiabetic drugs: Secondary | ICD-10-CM

## 2023-07-08 DIAGNOSIS — E1169 Type 2 diabetes mellitus with other specified complication: Secondary | ICD-10-CM

## 2023-07-08 DIAGNOSIS — Z6841 Body Mass Index (BMI) 40.0 and over, adult: Secondary | ICD-10-CM | POA: Diagnosis not present

## 2023-07-08 MED ORDER — VITAMIN D (ERGOCALCIFEROL) 1.25 MG (50000 UNIT) PO CAPS: 50000.0000 [IU] | ORAL_CAPSULE | ORAL | 0 refills | Status: DC

## 2023-07-08 MED ORDER — SEMAGLUTIDE (2 MG/DOSE) 8 MG/3ML ~~LOC~~ SOPN: 2.0000 mg | PEN_INJECTOR | SUBCUTANEOUS | 0 refills | Status: DC

## 2023-07-08 NOTE — Progress Notes (Signed)
Office: [phone]  /  Fax: [phone]  WEIGHT SUMMARY AND BIOMETRICS  Weight Lost Since Last Visit: 0lb  Weight Gained Since Last Visit: 0lb   Vitals Temp: (!) 97.5 F (36.4 C) BP: 135/73 Pulse Rate: 68 SpO2: 97 %   Anthropometric Measurements Height: 6' (1.829 m) Weight: (!) 306 lb (138.8 kg) BMI (Calculated): 41.49 Weight at Last Visit: 306lb Weight Lost Since Last Visit: 0lb Weight Gained Since Last Visit: 0lb Starting Weight: 339lb Total Weight Loss (lbs): 33 lb (15 kg)   Body Composition  Body Fat %: 43.2 % Fat Mass (lbs): 132.4 lbs Muscle Mass (lbs): 165.4 lbs Visceral Fat Rating : 28   Other Clinical Data Fasting: Yes Labs: No Today's Visit #: 18 Starting Date: 01/14/22     HPI  Chief Complaint: OBESITY  Geoffrey Robinson is here to discuss his progress with his obesity treatment plan. He is on the the Category 4 Plan and states he is following his eating plan approximately 50 % of the time. He states he is exercising 0 minutes 0 days per week.   Interval History:  Since last office visit he has maintained his weight.  He celebrated Christmas and went out of town for [employer].  He saw cardiology yesterday and has been scheduled for a gated chest CTA in April.  He notes "it has been a crazy time".  He hasn't been able to exercise as he was due to the weather and continues to struggle with knee pain. Considering getting a cane.  He drinking water and Sprite daily.    No upcoming traveling or celebrations  His highest weight was 380-390 lbs.    He wants to lose weight to have left knee replacement (<290 to be able to proceed with surgery).    Pharmacotherapy for weight loss: He is not currently taking medications for medical weight loss.     Previous pharmacotherapy for medical weight loss:  None   Bariatric surgery:  Patient has not had bariatric surgery.  Pharmacotherapy for DMT2:  He is currently taking Ozempic 2mg .  Denies side effects.    Last A1c was 6.7 CBGs: 115-130 Episodes of hypoglycemia: no On ACE  Last eye exam:  8/24  Lab Results  Component Value Date   HGBA1C 6.7 (H) 06/02/2023   HGBA1C 6.6 (H) 02/24/2023   HGBA1C 6.7 (H) 10/28/2022   Lab Results  Component Value Date   LDLCALC 111 (H) 02/24/2023   CREATININE 0.86 06/02/2023    Vit D deficiency  He is taking Vit D 50,000 IU weekly.  Denies side effects.  Denies nausea, vomiting or muscle weakness.    Lab Results  Component Value Date   VD25OH 40.3 06/02/2023   VD25OH 43.1 02/24/2023   VD25OH 36.3 10/28/2022     PHYSICAL EXAM:  Blood pressure 135/73, pulse 68, temperature (!) 97.5 F (36.4 C), height 6' (1.829 m), weight (!) 306 lb (138.8 kg), SpO2 97%. Body mass index is 41.5 kg/m.  General: He is overweight, cooperative, alert, well developed, and in no acute distress. PSYCH: Has normal mood, affect and thought process.   Extremities: No edema.  Neurologic: No gross sensory or motor deficits. No tremors or fasciculations noted.    DIAGNOSTIC DATA REVIEWED:  BMET    Component Value Date/Time   NA 142 06/02/2023 0849   K 4.7 06/02/2023 0849   CL 105 06/02/2023 0849   CO2 23 06/02/2023 0849   GLUCOSE 133 (H) 06/02/2023 0849   GLUCOSE 215 (H)  09/07/2019 0800   BUN 20 06/02/2023 0849   CREATININE 0.86 06/02/2023 0849   CREATININE 0.99 03/11/2016 0853   CALCIUM 9.7 06/02/2023 0849   GFRNONAA >60 08/15/2019 0820   GFRAA >60 08/15/2019 0820   Lab Results  Component Value Date   HGBA1C 6.7 (H) 06/02/2023   HGBA1C  04/10/2009    5.9 (NOTE) The ADA recommends the following therapeutic goal for glycemic control related to Hgb A1c measurement: Goal of therapy: <6.5 Hgb A1c  Reference: American Diabetes Association: Clinical Practice Recommendations 2010, Diabetes Care, 2010, 33: (Suppl  1).   Lab Results  Component Value Date   INSULIN 30.0 (H) 06/30/2022   INSULIN 27.1 (H) 01/14/2022   Lab Results  Component Value Date   TSH  0.478 06/02/2023   CBC    Component Value Date/Time   WBC 5.4 05/11/2023 0848   WBC 5.0 03/11/2016 0853   RBC 5.06 05/11/2023 0848   RBC 4.82 03/11/2016 0853   HGB 13.6 05/11/2023 0848   HCT 41.7 05/11/2023 0848   PLT 224 05/11/2023 0848   MCV 82 05/11/2023 0848   MCH 26.9 05/11/2023 0848   MCH 29.0 03/11/2016 0853   MCHC 32.6 05/11/2023 0848   MCHC 34.2 03/11/2016 0853   RDW 15.2 05/11/2023 0848   Iron Studies No results found for: "IRON", "TIBC", "FERRITIN", "IRONPCTSAT" Lipid Panel     Component Value Date/Time   CHOL 162 02/24/2023 0907   TRIG 93 02/24/2023 0907   HDL 34 (L) 02/24/2023 0907   CHOLHDL 4.6 04/11/2009 0451   VLDL 20 04/11/2009 0451   LDLCALC 111 (H) 02/24/2023 0907   Hepatic Function Panel     Component Value Date/Time   PROT 6.8 06/02/2023 0849   ALBUMIN 4.2 06/02/2023 0849   AST 16 06/02/2023 0849   ALT 18 06/02/2023 0849   ALKPHOS 71 06/02/2023 0849   BILITOT 0.5 06/02/2023 0849      Component Value Date/Time   TSH 0.478 06/02/2023 0849   Nutritional Lab Results  Component Value Date   VD25OH 40.3 06/02/2023   VD25OH 43.1 02/24/2023   VD25OH 36.3 10/28/2022     ASSESSMENT AND PLAN  TREATMENT PLAN FOR OBESITY:  Recommended Dietary Goals  Geoffrey Robinson is currently in the action stage of change. As such, his goal is to continue weight management plan. He has agreed to the Category 4 Plan.    Behavioral Intervention  We discussed the following Behavioral Modification Strategies today: increasing lean protein intake to established goals, decreasing simple carbohydrates , increasing vegetables, increasing fiber rich foods, increasing water intake , work on meal planning and preparation, reading food labels , and continue to work on maintaining a reduced calorie state, getting the recommended amount of protein, incorporating whole foods, making healthy choices, staying well hydrated and practicing mindfulness when eating..  Additional  resources provided today: NA  Recommended Physical Activity Goals  Geoffrey Robinson has been advised to work up to 150 minutes of moderate intensity aerobic activity a week and strengthening exercises 2-3 times per week for cardiovascular health, weight loss maintenance and preservation of muscle mass.   He has agreed to Think about enjoyable ways to increase daily physical activity and overcoming barriers to exercise, Increase physical activity in their day and reduce sedentary time (increase NEAT)., and Work on scheduling and tracking physical activity.    ASSOCIATED CONDITIONS ADDRESSED TODAY  Action/Plan  Type 2 diabetes mellitus with obesity (HCC) -     Semaglutide (2 MG/DOSE); Inject 2 mg  as directed once a week.  Dispense: 3 mL; Refill: 0  Vitamin D insufficiency -     Vitamin D (Ergocalciferol); Take 1 capsule (50,000 Units total) by mouth every 7 (seven) days.  Dispense: 5 capsule; Refill: 0  Morbid obesity (HCC)  BMI 40.0-44.9, adult (HCC)     Labs reviewed in chart with patient from 06/02/23  Encouraged to get a cane to help prevent falls  He is motivated to lose weight to be able to proceed with knee surgery.  Return in about 4 weeks (around 08/05/2023).[name] He was informed of the importance of frequent follow up visits to maximize his success with intensive lifestyle modifications for his multiple health conditions.   ATTESTASTION STATEMENTS:  Reviewed by clinician on day of visit: allergies, medications, problem list, medical history, surgical history, family history, social history, and previous encounter notes.    Theodis Sato. Othar Curto FNP-C

## 2023-07-28 ENCOUNTER — Other Ambulatory Visit (HOSPITAL_COMMUNITY): Payer: Self-pay | Admitting: Cardiology

## 2023-07-29 ENCOUNTER — Ambulatory Visit (HOSPITAL_COMMUNITY)
Admission: RE | Admit: 2023-07-29 | Discharge: 2023-07-29 | Disposition: A | Discharge status: Home / Self Care | Payer: 59 | Source: Ambulatory Visit | Attending: Cardiology | Admitting: Cardiology

## 2023-07-29 DIAGNOSIS — E669 Obesity, unspecified: Secondary | ICD-10-CM | POA: Diagnosis present

## 2023-07-29 DIAGNOSIS — E1169 Type 2 diabetes mellitus with other specified complication: Secondary | ICD-10-CM | POA: Diagnosis present

## 2023-07-29 DIAGNOSIS — I77810 Thoracic aortic ectasia: Secondary | ICD-10-CM | POA: Insufficient documentation

## 2023-07-29 LAB — POCT I-STAT CREATININE: Creatinine, Ser: 0.9 mg/dL (ref 0.61–1.24)

## 2023-07-29 MED ORDER — IOHEXOL 350 MG/ML SOLN
75.0000 mL | Freq: Once | INTRAVENOUS | Status: AC | PRN
  Administered 2023-07-29: 75 mL via INTRAVENOUS

## 2023-08-01 ENCOUNTER — Encounter: Payer: Self-pay | Admitting: Cardiology

## 2023-08-04 ENCOUNTER — Encounter: Payer: Self-pay | Admitting: Nurse Practitioner

## 2023-08-04 ENCOUNTER — Ambulatory Visit: Payer: 59 | Admitting: Nurse Practitioner

## 2023-08-04 ENCOUNTER — Encounter: Payer: Self-pay | Admitting: Cardiology

## 2023-08-04 VITALS — BP 134/72 | HR 60 | Temp 97.8°F | Ht 72.0 in | Wt 303.0 lb

## 2023-08-04 DIAGNOSIS — E559 Vitamin D deficiency, unspecified: Secondary | ICD-10-CM | POA: Diagnosis not present

## 2023-08-04 DIAGNOSIS — E041 Nontoxic single thyroid nodule: Secondary | ICD-10-CM | POA: Diagnosis not present

## 2023-08-04 DIAGNOSIS — I10 Essential (primary) hypertension: Secondary | ICD-10-CM

## 2023-08-04 DIAGNOSIS — E1169 Type 2 diabetes mellitus with other specified complication: Secondary | ICD-10-CM

## 2023-08-04 DIAGNOSIS — E66813 Obesity, class 3: Secondary | ICD-10-CM

## 2023-08-04 DIAGNOSIS — Z7985 Long-term (current) use of injectable non-insulin antidiabetic drugs: Secondary | ICD-10-CM

## 2023-08-04 DIAGNOSIS — Z6841 Body Mass Index (BMI) 40.0 and over, adult: Secondary | ICD-10-CM

## 2023-08-04 MED ORDER — SEMAGLUTIDE (2 MG/DOSE) 8 MG/3ML ~~LOC~~ SOPN: 2.0000 mg | PEN_INJECTOR | SUBCUTANEOUS | 0 refills | Status: DC

## 2023-08-04 MED ORDER — VITAMIN D (ERGOCALCIFEROL) 1.25 MG (50000 UNIT) PO CAPS: 50000.0000 [IU] | ORAL_CAPSULE | ORAL | 0 refills | Status: DC

## 2023-08-04 NOTE — Progress Notes (Signed)
Office: [phone]  /  Fax: [phone]  WEIGHT SUMMARY AND BIOMETRICS  Weight Lost Since Last Visit: 3lb  Weight Gained Since Last Visit: 0   Vitals Temp: 97.8 F (36.6 C) BP: 134/72 Pulse Rate: 60 SpO2: 97 %   Anthropometric Measurements Height: 6' (1.829 m) Weight: (!) 303 lb (137.4 kg) BMI (Calculated): 41.09 Weight at Last Visit: 306lb Weight Lost Since Last Visit: 3lb Weight Gained Since Last Visit: 0 Starting Weight: 339lb Total Weight Loss (lbs): 36 lb (16.3 kg)   Body Composition  Body Fat %: 42.7 % Fat Mass (lbs): 129.4 lbs Muscle Mass (lbs): 165 lbs Total Body Water (lbs): 141.8 lbs Visceral Fat Rating : 27   Other Clinical Data Fasting: no Labs: no Today's Visit #: 19 Starting Date: 01/14/22     HPI  Chief Complaint: OBESITY  Geoffrey Robinson is here to discuss his progress with his obesity treatment plan. He is on the the Category 4 Plan and states he is following his eating plan approximately 50 % of the time. He states he is exercising 0 minutes 0 days per week.   Interval History:  Since last office visit he has lost 3 pounds.  He has been trying to make healthier choices and watching his portion sizes.  He has been limiting carbs intake.  He is not skipping meals and is eating a protein with each meal. He is drinking water and 1 sprite daily.    No upcoming traveling or celebrations   His highest weight was 380-390 lbs.    He wants to lose weight to have left knee replacement (<290 to be able to proceed with surgery).    Pharmacotherapy for weight loss: He is not currently taking medications for medical weight loss.     Previous pharmacotherapy for medical weight loss:  None   Bariatric surgery:  Patient has not had bariatric surgery.  Pharmacotherapy for DMT2:    He is currently taking Ozempic 2mg .  Denies side effects.   Last A1c was 6.7 CBGs: 110-140s Episodes of hypoglycemia: denies On ACE -lisinopril 40 mg Last eye exam:   8/24  Lab Results  Component Value Date   HGBA1C 6.7 (H) 06/02/2023   HGBA1C 6.6 (H) 02/24/2023   HGBA1C 6.7 (H) 10/28/2022   Lab Results  Component Value Date   LDLCALC 111 (H) 02/24/2023   CREATININE 0.90 07/29/2023    Vit D deficiency  He is taking Vit D 50,000 IU weekly.  Denies side effects.  Denies nausea, vomiting or muscle weakness.    Lab Results  Component Value Date   VD25OH 40.3 06/02/2023   VD25OH 43.1 02/24/2023   VD25OH 36.3 10/28/2022    Thyroid nodule Patient had a Noncontrast CT of the chest performed on 07/29/23.  Dr. Mayford Knife noted to make a follow up appt with his PCP to schedule an [place].  Patient was not aware of this.  His PCP retired and he has not seen a new PCP yet.    "IMPRESSION: 1. 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 Continue annual imaging followup by CTA. This recommendation follows 2010 ACCF/AHA/AATS/ACR/ASA/SCA/SCAI/SIR/STS/SVM Guidelines for the Diagnosis and Management of Patients with Thoracic Aortic Disease. Circulation. 2010; 121: G956-O130. Aortic aneurysm NOS (ICD10-I71.9) 2. 2.2 cm LEFT thyroid nodule. Recommend routine US Thyroid for further evaluation."   Hypertension Hypertension stable.  Saw Dr. Ladona Ridgel last on 07/07/23 and Dr. Mayford Knife last on 05/10/24 Denies chest pain, palpitations and SOB.  BP Readings from Last 3 Encounters:  08/04/23 134/72  07/08/23 135/73  07/07/23 (!) 148/76   Lab Results  Component Value Date   CREATININE 0.90 07/29/2023   CREATININE 0.86 06/02/2023   CREATININE 1.05 02/24/2023     PHYSICAL EXAM:  Blood pressure 134/72, pulse 60, temperature 97.8 F (36.6 C), height 6' (1.829 m), weight (!) 303 lb (137.4 kg), SpO2 97%. Body mass index is 41.09 kg/m.  General: He is overweight, cooperative, alert, well developed, and in no acute distress. PSYCH: Has normal mood, affect and thought process.   Extremities: No edema.  Neurologic: No gross sensory or motor deficits. No  tremors or fasciculations noted.    DIAGNOSTIC DATA REVIEWED:  BMET    Component Value Date/Time   NA 142 06/02/2023 0849   K 4.7 06/02/2023 0849   CL 105 06/02/2023 0849   CO2 23 06/02/2023 0849   GLUCOSE 133 (H) 06/02/2023 0849   GLUCOSE 215 (H) 09/07/2019 0800   BUN 20 06/02/2023 0849   CREATININE 0.90 07/29/2023 0943   CREATININE 0.99 03/11/2016 0853   CALCIUM 9.7 06/02/2023 0849   GFRNONAA >60 08/15/2019 0820   GFRAA >60 08/15/2019 0820   Lab Results  Component Value Date   HGBA1C 6.7 (H) 06/02/2023   HGBA1C  04/10/2009    5.9 (NOTE) The ADA recommends the following therapeutic goal for glycemic control related to Hgb A1c measurement: Goal of therapy: <6.5 Hgb A1c  Reference: American Diabetes Association: Clinical Practice Recommendations 2010, Diabetes Care, 2010, 33: (Suppl  1).   Lab Results  Component Value Date   INSULIN 30.0 (H) 06/30/2022   INSULIN 27.1 (H) 01/14/2022   Lab Results  Component Value Date   TSH 0.478 06/02/2023   CBC    Component Value Date/Time   WBC 5.4 05/11/2023 0848   WBC 5.0 03/11/2016 0853   RBC 5.06 05/11/2023 0848   RBC 4.82 03/11/2016 0853   HGB 13.6 05/11/2023 0848   HCT 41.7 05/11/2023 0848   PLT 224 05/11/2023 0848   MCV 82 05/11/2023 0848   MCH 26.9 05/11/2023 0848   MCH 29.0 03/11/2016 0853   MCHC 32.6 05/11/2023 0848   MCHC 34.2 03/11/2016 0853   RDW 15.2 05/11/2023 0848   Iron Studies No results found for: "IRON", "TIBC", "FERRITIN", "IRONPCTSAT" Lipid Panel     Component Value Date/Time   CHOL 162 02/24/2023 0907   TRIG 93 02/24/2023 0907   HDL 34 (L) 02/24/2023 0907   CHOLHDL 4.6 04/11/2009 0451   VLDL 20 04/11/2009 0451   LDLCALC 111 (H) 02/24/2023 0907   Hepatic Function Panel     Component Value Date/Time   PROT 6.8 06/02/2023 0849   ALBUMIN 4.2 06/02/2023 0849   AST 16 06/02/2023 0849   ALT 18 06/02/2023 0849   ALKPHOS 71 06/02/2023 0849   BILITOT 0.5 06/02/2023 0849      Component Value  Date/Time   TSH 0.478 06/02/2023 0849   Nutritional Lab Results  Component Value Date   VD25OH 40.3 06/02/2023   VD25OH 43.1 02/24/2023   VD25OH 36.3 10/28/2022     ASSESSMENT AND PLAN  TREATMENT PLAN FOR OBESITY:  Recommended Dietary Goals  Ronit is currently in the action stage of change. As such, his goal is to continue weight management plan. He has agreed to the Category 4 Plan.  Behavioral Intervention  We discussed the following Behavioral Modification Strategies today: increasing lean protein intake to established goals, increasing vegetables, increasing fiber rich foods, increasing water intake , continue to work on  implementation of reduced calorie nutritional plan, continue to practice mindfulness when eating, planning for success, and continue to work on maintaining a reduced calorie state, getting the recommended amount of protein, incorporating whole foods, making healthy choices, staying well hydrated and practicing mindfulness when eating..  Additional resources provided today: NA  Recommended Physical Activity Goals  Giovanne has been advised to work up to 150 minutes of moderate intensity aerobic activity a week and strengthening exercises 2-3 times per week for cardiovascular health, weight loss maintenance and preservation of muscle mass.   He has agreed to Think about enjoyable ways to increase daily physical activity and overcoming barriers to exercise, Increase physical activity in their day and reduce sedentary time (increase NEAT)., and Work on scheduling and tracking physical activity.    ASSOCIATED CONDITIONS ADDRESSED TODAY  Action/Plan  Vitamin D insufficiency -     Continue Vitamin D (Ergocalciferol); Take 1 capsule (50,000 Units total) by mouth every 7 (seven) days.  Dispense: 5 capsule; Refill: 0  Type 2 diabetes mellitus with obesity (HCC) -     Semaglutide (2 MG/DOSE); Inject 2 mg as directed once a week.  Dispense: 3 mL; Refill: 0. Side  effects discussed.  Will obtain labs at next visit    Thyroid nodule -     US THYROID; Future  Patient's PCP has retired and he has not seen a new PCP yet.  Due to recent finding on CT scan, I will schedule his thyroid ultrasound.  Will obtain labs at next visit  Patient has a history of hyperthyroidism.  Has looked better with medication change.  See cardiology notes.   Hypertension, unspecified type Continue follow-up with cardiology.  Continue medications as directed.  Class 3 severe obesity due to excess calories with serious comorbidity and body mass index (BMI) of 40.0 to 44.9 in adult [facility])      Will obtain labs at next visit.    Return in about 4 weeks (around 09/01/2023).[name] He was informed of the importance of frequent follow up visits to maximize his success with intensive lifestyle modifications for his multiple health conditions.   ATTESTASTION STATEMENTS:  Reviewed by clinician on day of visit: allergies, medications, problem list, medical history, surgical history, family history, social history, and previous encounter notes.     Theodis Sato. Mayar Whittier FNP-C

## 2023-08-06 ENCOUNTER — Telehealth: Payer: Self-pay

## 2023-08-06 NOTE — Telephone Encounter (Signed)
Call to patient to discuss chest CT results. No answer, left message advising patient to call our office to discuss chest CT results. Forwarded results to PCP.

## 2023-08-06 NOTE — Telephone Encounter (Signed)
-----   Message from [name] sent at 08/01/2023  8:00 PM EST ----- Chest CT showed stable ascending aorta measuring 4.1cm. When this is indexed for body surface area the measurement is actually within normal limits.You also have a 2.2cm left thyroid nodule which requires an [place] for further assessment.  We will forward this result to your PCP.  Please call you PCP to schedule an appt ASAP to get [place] scheduled.

## 2023-08-09 ENCOUNTER — Telehealth: Payer: Self-pay

## 2023-08-09 NOTE — Telephone Encounter (Signed)
 Call to patient, spoke with spouse Bonita Quin [facility]). Explained Chest CT showed stable ascending aorta measuring 4.1cm. Per Dr. Mayford Knife, when this is indexed for body surface area the measurement is actually within normal limits. Spouse verbalized understanding of presence of  2.2cm left thyroid nodule which requires an [place] for further assessment. She states PCP already aware and set up [place] appt for this week which is visible in epic.

## 2023-08-13 ENCOUNTER — Other Ambulatory Visit: Payer: 59

## 2023-08-13 ENCOUNTER — Ambulatory Visit
Admission: RE | Admit: 2023-08-13 | Discharge: 2023-08-13 | Disposition: A | Discharge status: Home / Self Care | Payer: 59 | Source: Ambulatory Visit | Attending: Nurse Practitioner | Admitting: Nurse Practitioner

## 2023-08-13 DIAGNOSIS — E041 Nontoxic single thyroid nodule: Secondary | ICD-10-CM

## 2023-08-16 ENCOUNTER — Other Ambulatory Visit: Payer: Self-pay | Admitting: Internal Medicine

## 2023-08-16 DIAGNOSIS — I4819 Other persistent atrial fibrillation: Secondary | ICD-10-CM

## 2023-08-16 NOTE — Telephone Encounter (Signed)
 Prescription refill request for Xarelto received.  Indication: AF Last office visit: 07/07/23  Solmon Ice MD Weight: 141.6kg Age: 63 Scr: 0.90 on 07/29/23 CrCl: 174.81  Based on above findings Xarelto 20mg  daily is the appropriate dose.  Refill approved.

## 2023-08-18 ENCOUNTER — Other Ambulatory Visit: Payer: Self-pay | Admitting: Nurse Practitioner

## 2023-08-18 ENCOUNTER — Encounter: Payer: Self-pay | Admitting: Nurse Practitioner

## 2023-08-18 DIAGNOSIS — E1169 Type 2 diabetes mellitus with other specified complication: Secondary | ICD-10-CM

## 2023-08-19 ENCOUNTER — Other Ambulatory Visit: Payer: Self-pay | Admitting: Nurse Practitioner

## 2023-08-19 DIAGNOSIS — E041 Nontoxic single thyroid nodule: Secondary | ICD-10-CM

## 2023-08-23 ENCOUNTER — Telehealth: Payer: Self-pay

## 2023-08-23 NOTE — Telephone Encounter (Signed)
 error

## 2023-08-26 ENCOUNTER — Encounter: Payer: Self-pay | Admitting: Nurse Practitioner

## 2023-08-26 ENCOUNTER — Other Ambulatory Visit: Payer: Self-pay | Admitting: Nurse Practitioner

## 2023-09-01 ENCOUNTER — Encounter: Payer: Self-pay | Admitting: Nurse Practitioner

## 2023-09-01 ENCOUNTER — Ambulatory Visit: Payer: 59 | Admitting: Nurse Practitioner

## 2023-09-01 VITALS — BP 110/68 | HR 68 | Temp 97.9°F | Ht 72.0 in | Wt 300.0 lb

## 2023-09-01 DIAGNOSIS — E66813 Obesity, class 3: Secondary | ICD-10-CM

## 2023-09-01 DIAGNOSIS — Z7985 Long-term (current) use of injectable non-insulin antidiabetic drugs: Secondary | ICD-10-CM

## 2023-09-01 DIAGNOSIS — E1169 Type 2 diabetes mellitus with other specified complication: Secondary | ICD-10-CM

## 2023-09-01 DIAGNOSIS — E041 Nontoxic single thyroid nodule: Secondary | ICD-10-CM | POA: Diagnosis not present

## 2023-09-01 DIAGNOSIS — Z6841 Body Mass Index (BMI) 40.0 and over, adult: Secondary | ICD-10-CM

## 2023-09-01 DIAGNOSIS — E559 Vitamin D deficiency, unspecified: Secondary | ICD-10-CM

## 2023-09-01 MED ORDER — SEMAGLUTIDE (2 MG/DOSE) 8 MG/3ML ~~LOC~~ SOPN: 2.0000 mg | PEN_INJECTOR | SUBCUTANEOUS | 0 refills | Status: DC

## 2023-09-01 MED ORDER — VITAMIN D (ERGOCALCIFEROL) 1.25 MG (50000 UNIT) PO CAPS: 50000.0000 [IU] | ORAL_CAPSULE | ORAL | 0 refills | Status: DC

## 2023-09-01 NOTE — Progress Notes (Signed)
 Office: [phone]  /  Fax: [phone]  WEIGHT SUMMARY AND BIOMETRICS  Weight Lost Since Last Visit: 3  Weight Gained Since Last Visit: 0   Vitals Temp: 97.9 F (36.6 C) BP: 110/68 Pulse Rate: 68 SpO2: 98 %   Anthropometric Measurements Height: 6' (1.829 m) Weight: 300 lb (136.1 kg) BMI (Calculated): 40.68 Weight at Last Visit: 303 lb Weight Lost Since Last Visit: 3 Weight Gained Since Last Visit: 0 Starting Weight: 339 lb Total Weight Loss (lbs): 39 lb (17.7 kg)   Body Composition  Body Fat %: 42.6 % Fat Mass (lbs): 127.8 lbs Muscle Mass (lbs): 163.8 lbs Total Body Water (lbs): 141.6 lbs Visceral Fat Rating : 27   Other Clinical Data Fasting: yes Labs: yes Today's Visit #: 20 Starting Date: 01/14/22     HPI  Chief Complaint: OBESITY  Geoffrey Robinson is here to discuss his progress with his obesity treatment plan. He is on the the Category 4 Plan and states he is following his eating plan approximately 50 % of the time. He states he is exercising 0 minutes 0 days per week.   Interval History:  Since last office visit he has lost 3 pounds. He is not skipping meals.  He is eating a protein with each meal.  He is drinking a protein shake 3-4 days per week.  He is drinking water daily.   He continues to struggle with knee pain and notes the pain is getting worse.    His highest weight was 380-390 lbs.    He wants to lose weight to have left knee replacement (<290 to be able to proceed with surgery).    Pharmacotherapy for weight loss: He is not currently taking medications for medical weight loss.     Previous pharmacotherapy for medical weight loss:  None   Bariatric surgery:  Patient has not had bariatric surgery.  Pharmacotherapy for DMT2:  He is currently taking Ozempic 2mg .  Denies side effects.   Last A1c was 6.7 CBGs: 115-140 Episodes of hypoglycemia: None On ACE-lisinopril 40 mg Last eye exam:  8/24  Lab Results  Component Value Date    HGBA1C 6.7 (H) 06/02/2023   HGBA1C 6.6 (H) 02/24/2023   HGBA1C 6.7 (H) 10/28/2022   Lab Results  Component Value Date   LDLCALC 111 (H) 02/24/2023   CREATININE 0.90 07/29/2023    Thyroid nodule Saw Dr. Jenne Pane yesterday and is going to be scheduled for a biopsy.   Vit D deficiency  He is taking Vit D 50,000 IU weekly.  Denies side effects.  Denies nausea, vomiting or muscle weakness.    Lab Results  Component Value Date   VD25OH 40.3 06/02/2023   VD25OH 43.1 02/24/2023   VD25OH 36.3 10/28/2022      PHYSICAL EXAM:  Blood pressure 110/68, pulse 68, temperature 97.9 F (36.6 C), height 6' (1.829 m), weight 300 lb (136.1 kg), SpO2 98%. Body mass index is 40.69 kg/m.  General: He is overweight, cooperative, alert, well developed, and in no acute distress. PSYCH: Has normal mood, affect and thought process.   Extremities: No edema.  Neurologic: No gross sensory or motor deficits. No tremors or fasciculations noted.    DIAGNOSTIC DATA REVIEWED:  BMET    Component Value Date/Time   NA 142 06/02/2023 0849   K 4.7 06/02/2023 0849   CL 105 06/02/2023 0849   CO2 23 06/02/2023 0849   GLUCOSE 133 (H) 06/02/2023 0849   GLUCOSE 215 (H) 09/07/2019 0800   BUN  20 06/02/2023 0849   CREATININE 0.90 07/29/2023 0943   CREATININE 0.99 03/11/2016 0853   CALCIUM 9.7 06/02/2023 0849   GFRNONAA >60 08/15/2019 0820   GFRAA >60 08/15/2019 0820   Lab Results  Component Value Date   HGBA1C 6.7 (H) 06/02/2023   HGBA1C  04/10/2009    5.9 (NOTE) The ADA recommends the following therapeutic goal for glycemic control related to Hgb A1c measurement: Goal of therapy: <6.5 Hgb A1c  Reference: American Diabetes Association: Clinical Practice Recommendations 2010, Diabetes Care, 2010, 33: (Suppl  1).   Lab Results  Component Value Date   INSULIN 30.0 (H) 06/30/2022   INSULIN 27.1 (H) 01/14/2022   Lab Results  Component Value Date   TSH 0.478 06/02/2023   CBC    Component Value Date/Time    WBC 5.4 05/11/2023 0848   WBC 5.0 03/11/2016 0853   RBC 5.06 05/11/2023 0848   RBC 4.82 03/11/2016 0853   HGB 13.6 05/11/2023 0848   HCT 41.7 05/11/2023 0848   PLT 224 05/11/2023 0848   MCV 82 05/11/2023 0848   MCH 26.9 05/11/2023 0848   MCH 29.0 03/11/2016 0853   MCHC 32.6 05/11/2023 0848   MCHC 34.2 03/11/2016 0853   RDW 15.2 05/11/2023 0848   Iron Studies No results found for: "IRON", "TIBC", "FERRITIN", "IRONPCTSAT" Lipid Panel     Component Value Date/Time   CHOL 162 02/24/2023 0907   TRIG 93 02/24/2023 0907   HDL 34 (L) 02/24/2023 0907   CHOLHDL 4.6 04/11/2009 0451   VLDL 20 04/11/2009 0451   LDLCALC 111 (H) 02/24/2023 0907   Hepatic Function Panel     Component Value Date/Time   PROT 6.8 06/02/2023 0849   ALBUMIN 4.2 06/02/2023 0849   AST 16 06/02/2023 0849   ALT 18 06/02/2023 0849   ALKPHOS 71 06/02/2023 0849   BILITOT 0.5 06/02/2023 0849      Component Value Date/Time   TSH 0.478 06/02/2023 0849   Nutritional Lab Results  Component Value Date   VD25OH 40.3 06/02/2023   VD25OH 43.1 02/24/2023   VD25OH 36.3 10/28/2022     ASSESSMENT AND PLAN  TREATMENT PLAN FOR OBESITY:  Recommended Dietary Goals  [name] is currently in the action stage of change. As such, his goal is to continue weight management plan. He has agreed to the Category 4 Plan.  Behavioral Intervention  We discussed the following Behavioral Modification Strategies today: increasing lean protein intake to established goals, decreasing simple carbohydrates , increasing vegetables, increasing water intake , and continue to work on maintaining a reduced calorie state, getting the recommended amount of protein, incorporating whole foods, making healthy choices, staying well hydrated and practicing mindfulness when eating..  Additional resources provided today: NA  Recommended Physical Activity Goals  [name] has been advised to work up to 150 minutes of moderate intensity aerobic  activity a week and strengthening exercises 2-3 times per week for cardiovascular health, weight loss maintenance and preservation of muscle mass.   He has agreed to Unable to participate in physical activity at present due to medical conditions    ASSOCIATED CONDITIONS ADDRESSED TODAY  Action/Plan  Type 2 diabetes mellitus with obesity (HCC) -     Continue Semaglutide (2 MG/DOSE); Inject 2 mg as directed once a week.  Dispense: 3 mL; Refill: 0. Side effects discussed  Thyroid nodule Continue to follow up with Dr. Jenne Pane.  Waiting for appt for biopsy.    Vitamin D insufficiency -     Vitamin  D (Ergocalciferol); Take 1 capsule (50,000 Units total) by mouth every 7 (seven) days.  Dispense: 5 capsule; Refill: 0.  Side effects discussed  Class 3 severe obesity due to excess calories with serious comorbidity and body mass index (BMI) of 40.0 to 44.9 in adult [facility])      Patient would like to wait and obtain labs at next visit.    Return in about 4 weeks (around 09/29/2023).[name] He was informed of the importance of frequent follow up visits to maximize his success with intensive lifestyle modifications for his multiple health conditions.   ATTESTASTION STATEMENTS:  Reviewed by clinician on day of visit: allergies, medications, problem list, medical history, surgical history, family history, social history, and previous encounter notes.     Theodis Sato. Gottlieb Zuercher FNP-C

## 2023-09-06 ENCOUNTER — Other Ambulatory Visit: Payer: Self-pay | Admitting: Otolaryngology

## 2023-09-06 DIAGNOSIS — E042 Nontoxic multinodular goiter: Secondary | ICD-10-CM

## 2023-09-27 ENCOUNTER — Institutional Professional Consult (permissible substitution) (INDEPENDENT_AMBULATORY_CARE_PROVIDER_SITE_OTHER): Admitting: Otolaryngology

## 2023-10-06 ENCOUNTER — Encounter: Payer: Self-pay | Admitting: Nurse Practitioner

## 2023-10-06 ENCOUNTER — Ambulatory Visit: Admitting: Nurse Practitioner

## 2023-10-06 VITALS — BP 136/77 | HR 70 | Temp 98.3°F | Ht 72.0 in | Wt 301.0 lb

## 2023-10-06 DIAGNOSIS — E66813 Obesity, class 3: Secondary | ICD-10-CM

## 2023-10-06 DIAGNOSIS — Z79899 Other long term (current) drug therapy: Secondary | ICD-10-CM

## 2023-10-06 DIAGNOSIS — Z7985 Long-term (current) use of injectable non-insulin antidiabetic drugs: Secondary | ICD-10-CM

## 2023-10-06 DIAGNOSIS — E0590 Thyrotoxicosis, unspecified without thyrotoxic crisis or storm: Secondary | ICD-10-CM | POA: Diagnosis not present

## 2023-10-06 DIAGNOSIS — E559 Vitamin D deficiency, unspecified: Secondary | ICD-10-CM

## 2023-10-06 DIAGNOSIS — E041 Nontoxic single thyroid nodule: Secondary | ICD-10-CM | POA: Diagnosis not present

## 2023-10-06 DIAGNOSIS — E785 Hyperlipidemia, unspecified: Secondary | ICD-10-CM

## 2023-10-06 DIAGNOSIS — R748 Abnormal levels of other serum enzymes: Secondary | ICD-10-CM

## 2023-10-06 DIAGNOSIS — E1169 Type 2 diabetes mellitus with other specified complication: Secondary | ICD-10-CM

## 2023-10-06 DIAGNOSIS — Z6841 Body Mass Index (BMI) 40.0 and over, adult: Secondary | ICD-10-CM

## 2023-10-06 NOTE — Progress Notes (Signed)
 Office: [phone]  /  Fax: [phone]  WEIGHT SUMMARY AND BIOMETRICS  Weight Lost Since Last Visit: 0lb  Weight Gained Since Last Visit: 1lb   Vitals Temp: 98.3 F (36.8 C) BP: 136/77 Pulse Rate: 70 SpO2: 98 %   Anthropometric Measurements Height: 6' (1.829 m) Weight: (!) 301 lb (136.5 kg) BMI (Calculated): 40.81 Weight at Last Visit: 300lb Weight Lost Since Last Visit: 0lb Weight Gained Since Last Visit: 1lb Starting Weight: 339lb Total Weight Loss (lbs): 38 lb (17.2 kg)   Body Composition  Body Fat %: 42.7 % Fat Mass (lbs): 128.8 lbs Muscle Mass (lbs): 164.6 lbs Visceral Fat Rating : 27   Other Clinical Data Fasting: Yes Labs: No Today's Visit #: 21 Starting Date: 01/14/22     HPI  Chief Complaint: OBESITY  Geoffrey Robinson is here to discuss his progress with his obesity treatment plan. He is on the the Category 4 Plan and states he is following his eating plan approximately 50 % of the time. He states he is exercising 0 minutes 0 days per week.   Interval History:  Since last office visit he has gained 1 pound.  He is staying active by working in his yard.    Food recall: BF:  biscuit or sandwich with egg/cheese or tuna Snack:  pecans Lunch:  salad with a protein or [place] Snack:  whatever I decide to eat Dinner:  protein, vegetable Drinks:  water, diet drink  His highest weight was 380-390 lbs.    He wants to lose weight to have left knee replacement (<290 to be able to proceed with surgery).  Planning to make a follow up appt with ortho.      Pharmacotherapy for weight loss: He is not currently taking medications for medical weight loss.     Previous pharmacotherapy for medical weight loss:  None   Bariatric surgery:  Patient has not had bariatric surgery.  Pharmacotherapy for DMT2:  He  is currently taking Ozempic 2mg .  Denies side effects.   Last A1c was 6.7 CBGs: He reports his sugars are running higher since his last visit.   120-150s (fasting). He has not changed his diet that he feels would increase his sugars.   Episodes of hypoglycemia: None On ACE-lisinopril 40 mg Last eye exam:  8/24  Lab Results  Component Value Date   HGBA1C 6.7 (H) 06/02/2023   HGBA1C 6.6 (H) 02/24/2023   HGBA1C 6.7 (H) 10/28/2022   Lab Results  Component Value Date   LDLCALC 111 (H) 02/24/2023   CREATININE 0.90 07/29/2023    Thyroid nodule/Hyperthyroidism Scheduled for biopsy May 2nd.   Cardiology stopped his amio and started him on digoxin 0.125mg  (started Dec 1st).   Hyperlipidemia/low serum HDL Medication(s): Tricor 145mg . Denies side effects.   Was on statin in the past and was stopped by cardiology.   Lab Results  Component Value Date   CHOL 162 02/24/2023   HDL 34 (L) 02/24/2023   LDLCALC 111 (H) 02/24/2023   TRIG 93 02/24/2023   CHOLHDL 4.6 04/11/2009   Lab Results  Component Value Date   ALT 18 06/02/2023   AST 16 06/02/2023   ALKPHOS 71 06/02/2023   BILITOT 0.5 06/02/2023   The 10-year ASCVD risk score (Arnett DK, et al., 2019) is: 22%   Values used to calculate the score:     Age: 63 years     Sex: Male     Is Non-Hispanic African American: No     Diabetic:  Yes     Tobacco smoker: No     Systolic Blood Pressure: 136 mmHg     Is BP treated: Yes     HDL Cholesterol: 34 mg/dL     Total Cholesterol: 162 mg/dL   Vit D deficiency  He is taking Vit D 50,000 IU weekly.  Denies side effects.  Denies nausea, vomiting or muscle weakness.    Lab Results  Component Value Date   VD25OH 40.3 06/02/2023   VD25OH 43.1 02/24/2023   VD25OH 36.3 10/28/2022    PHYSICAL EXAM:  Blood pressure 136/77, pulse 70, temperature 98.3 F (36.8 C), height 6' (1.829 m), weight (!) 301 lb (136.5 kg), SpO2 98%. Body mass index is 40.82 kg/m.  General: He is overweight, cooperative, alert, well developed, and in no acute distress. PSYCH: Has normal mood, affect and thought process.   Extremities: No edema.   Neurologic: No gross sensory or motor deficits. No tremors or fasciculations noted.    DIAGNOSTIC DATA REVIEWED:  BMET    Component Value Date/Time   NA 142 06/02/2023 0849   K 4.7 06/02/2023 0849   CL 105 06/02/2023 0849   CO2 23 06/02/2023 0849   GLUCOSE 133 (H) 06/02/2023 0849   GLUCOSE 215 (H) 09/07/2019 0800   BUN 20 06/02/2023 0849   CREATININE 0.90 07/29/2023 0943   CREATININE 0.99 03/11/2016 0853   CALCIUM 9.7 06/02/2023 0849   GFRNONAA >60 08/15/2019 0820   GFRAA >60 08/15/2019 0820   Lab Results  Component Value Date   HGBA1C 6.7 (H) 06/02/2023   HGBA1C  04/10/2009    5.9 (NOTE) The ADA recommends the following therapeutic goal for glycemic control related to Hgb A1c measurement: Goal of therapy: <6.5 Hgb A1c  Reference: American Diabetes Association: Clinical Practice Recommendations 2010, Diabetes Care, 2010, 33: (Suppl  1).   Lab Results  Component Value Date   INSULIN 30.0 (H) 06/30/2022   INSULIN 27.1 (H) 01/14/2022   Lab Results  Component Value Date   TSH 0.478 06/02/2023   CBC    Component Value Date/Time   WBC 5.4 05/11/2023 0848   WBC 5.0 03/11/2016 0853   RBC 5.06 05/11/2023 0848   RBC 4.82 03/11/2016 0853   HGB 13.6 05/11/2023 0848   HCT 41.7 05/11/2023 0848   PLT 224 05/11/2023 0848   MCV 82 05/11/2023 0848   MCH 26.9 05/11/2023 0848   MCH 29.0 03/11/2016 0853   MCHC 32.6 05/11/2023 0848   MCHC 34.2 03/11/2016 0853   RDW 15.2 05/11/2023 0848   Iron Studies No results found for: "IRON", "TIBC", "FERRITIN", "IRONPCTSAT" Lipid Panel     Component Value Date/Time   CHOL 162 02/24/2023 0907   TRIG 93 02/24/2023 0907   HDL 34 (L) 02/24/2023 0907   CHOLHDL 4.6 04/11/2009 0451   VLDL 20 04/11/2009 0451   LDLCALC 111 (H) 02/24/2023 0907   Hepatic Function Panel     Component Value Date/Time   PROT 6.8 06/02/2023 0849   ALBUMIN 4.2 06/02/2023 0849   AST 16 06/02/2023 0849   ALT 18 06/02/2023 0849   ALKPHOS 71 06/02/2023 0849    BILITOT 0.5 06/02/2023 0849      Component Value Date/Time   TSH 0.478 06/02/2023 0849   Nutritional Lab Results  Component Value Date   VD25OH 40.3 06/02/2023   VD25OH 43.1 02/24/2023   VD25OH 36.3 10/28/2022     ASSESSMENT AND PLAN  TREATMENT PLAN FOR OBESITY:  Recommended Dietary Goals  Geoffrey Robinson is currently  in the action stage of change. As such, his goal is to continue weight management plan. He has agreed to the Category 4 Plan.  Behavioral Intervention  We discussed the following Behavioral Modification Strategies today: increasing lean protein intake to established goals, decreasing simple carbohydrates , increasing fiber rich foods, increasing water intake , work on meal planning and preparation, reading food labels , keeping healthy foods at home, and continue to work on maintaining a reduced calorie state, getting the recommended amount of protein, incorporating whole foods, making healthy choices, staying well hydrated and practicing mindfulness when eating..  Additional resources provided today: NA  Recommended Physical Activity Goals  He is limited/unable to participate in physical activity at present due to medical conditions   ASSOCIATED CONDITIONS ADDRESSED TODAY  Action/Plan  Type 2 diabetes mellitus with obesity (HCC) -     Hemoglobin A1c  Discussed stopping Ozempic and adding a Mounjaro or continuing Ozempic and adding metformin.  Will discuss plan of care based upon lab results.  Patient does not want to start Metformin.  Geoffrey Robinson his wife took in the past and stopped due to side effects.    Hyperthyroidism -     TSH  Keep appointment scheduled for biopsy.  Thyroid nodule Keep appointment scheduled for biopsy.  Dyslipidemia -     Lipid Panel With LDL/HDL Ratio  Continue follow-up with cardiology and PCP.  Continue medications as directed.  Low serum HDL -     Lipid Panel With LDL/HDL Ratio  Vitamin D insufficiency -     VITAMIN D 25 Hydroxy  (Vit-D Deficiency, Fractures)  Medication management -     Comprehensive metabolic panel with GFR  Class 3 severe obesity due to excess calories with serious comorbidity and body mass index (BMI) of 40.0 to 44.9 in adult Geoffrey Robinson)       Return in about 4 weeks (around 11/03/2023).Geoffrey Robinson He was informed of the importance of frequent follow up visits to maximize his success with intensive lifestyle modifications for his multiple health conditions.   ATTESTASTION STATEMENTS:  Reviewed by clinician on day of visit: allergies, medications, problem list, medical history, surgical history, family history, social history, and previous encounter notes.     Geoffrey Robinson. Ariz Terrones FNP-C

## 2023-10-07 ENCOUNTER — Other Ambulatory Visit: Payer: Self-pay | Admitting: Nurse Practitioner

## 2023-10-07 LAB — COMPREHENSIVE METABOLIC PANEL WITH GFR
ALT: 26 IU/L (ref 0–44)
AST: 21 IU/L (ref 0–40)
Albumin: 4.3 g/dL (ref 3.8–4.9)
Alkaline Phosphatase: 87 IU/L (ref 44–121)
BUN/Creatinine Ratio: 22 (ref 10–24)
BUN: 19 mg/dL (ref 8–27)
Bilirubin Total: 0.4 mg/dL (ref 0.0–1.2)
CO2: 21 mmol/L (ref 20–29)
Calcium: 9.7 mg/dL (ref 8.6–10.2)
Chloride: 105 mmol/L (ref 96–106)
Creatinine, Ser: 0.88 mg/dL (ref 0.76–1.27)
Globulin, Total: 2.8 g/dL (ref 1.5–4.5)
Glucose: 163 mg/dL — ABNORMAL HIGH (ref 70–99)
Potassium: 4.9 mmol/L (ref 3.5–5.2)
Sodium: 144 mmol/L (ref 134–144)
Total Protein: 7.1 g/dL (ref 6.0–8.5)
eGFR: 98 mL/min/{1.73_m2} (ref >59)

## 2023-10-07 LAB — LIPID PANEL WITH LDL/HDL RATIO
Cholesterol, Total: 161 mg/dL (ref 100–199)
HDL: 29 mg/dL — ABNORMAL LOW (ref >39)
LDL Chol Calc (NIH): 101 mg/dL — ABNORMAL HIGH (ref 0–99)
LDL/HDL Ratio: 3.5 ratio (ref 0.0–3.6)
Triglycerides: 174 mg/dL — ABNORMAL HIGH (ref 0–149)
VLDL Cholesterol Cal: 31 mg/dL (ref 5–40)

## 2023-10-07 LAB — HEMOGLOBIN A1C
Est. average glucose Bld gHb Est-mCnc: 146 mg/dL
Hgb A1c MFr Bld: 6.7 % — ABNORMAL HIGH (ref 4.8–5.6)

## 2023-10-07 LAB — VITAMIN D 25 HYDROXY (VIT D DEFICIENCY, FRACTURES): Vit D, 25-Hydroxy: 36.6 ng/mL (ref 30.0–100.0)

## 2023-10-07 LAB — TSH: TSH: 0.364 u[IU]/mL — ABNORMAL LOW (ref 0.450–4.500)

## 2023-10-07 MED ORDER — TIRZEPATIDE 5 MG/0.5ML ~~LOC~~ SOAJ: 5.0000 mg | SUBCUTANEOUS | 0 refills | Status: DC

## 2023-10-07 NOTE — Addendum Note (Signed)
 Addended by: Mariano Doshi on: 10/07/2023 04:54 PM   Modules accepted: Orders

## 2023-10-14 ENCOUNTER — Other Ambulatory Visit: Payer: Self-pay | Admitting: Nurse Practitioner

## 2023-10-14 DIAGNOSIS — E669 Obesity, unspecified: Secondary | ICD-10-CM

## 2023-10-15 ENCOUNTER — Other Ambulatory Visit

## 2023-10-22 ENCOUNTER — Other Ambulatory Visit (HOSPITAL_COMMUNITY)
Admission: RE | Admit: 2023-10-22 | Discharge: 2023-10-22 | Disposition: A | Discharge status: Home / Self Care | Source: Ambulatory Visit | Attending: Student | Admitting: Student

## 2023-10-22 ENCOUNTER — Ambulatory Visit
Admission: RE | Admit: 2023-10-22 | Discharge: 2023-10-22 | Disposition: A | Discharge status: Home / Self Care | Source: Ambulatory Visit | Attending: Otolaryngology | Admitting: Otolaryngology

## 2023-10-22 DIAGNOSIS — E042 Nontoxic multinodular goiter: Secondary | ICD-10-CM | POA: Diagnosis present

## 2023-10-25 LAB — CYTOLOGY - NON PAP

## 2023-11-03 ENCOUNTER — Encounter: Payer: Self-pay | Admitting: Nurse Practitioner

## 2023-11-03 ENCOUNTER — Ambulatory Visit: Admitting: Nurse Practitioner

## 2023-11-03 VITALS — BP 139/71 | HR 59 | Temp 98.0°F | Ht 72.0 in | Wt 300.0 lb

## 2023-11-03 DIAGNOSIS — E0590 Thyrotoxicosis, unspecified without thyrotoxic crisis or storm: Secondary | ICD-10-CM | POA: Diagnosis not present

## 2023-11-03 DIAGNOSIS — E66813 Obesity, class 3: Secondary | ICD-10-CM

## 2023-11-03 DIAGNOSIS — Z6841 Body Mass Index (BMI) 40.0 and over, adult: Secondary | ICD-10-CM

## 2023-11-03 DIAGNOSIS — E559 Vitamin D deficiency, unspecified: Secondary | ICD-10-CM

## 2023-11-03 DIAGNOSIS — E041 Nontoxic single thyroid nodule: Secondary | ICD-10-CM

## 2023-11-03 DIAGNOSIS — E1169 Type 2 diabetes mellitus with other specified complication: Secondary | ICD-10-CM

## 2023-11-03 DIAGNOSIS — Z7985 Long-term (current) use of injectable non-insulin antidiabetic drugs: Secondary | ICD-10-CM

## 2023-11-03 MED ORDER — TIRZEPATIDE 7.5 MG/0.5ML ~~LOC~~ SOAJ: 7.5000 mg | SUBCUTANEOUS | 0 refills | Status: DC

## 2023-11-03 MED ORDER — VITAMIN D (ERGOCALCIFEROL) 1.25 MG (50000 UNIT) PO CAPS: 50000.0000 [IU] | ORAL_CAPSULE | ORAL | 0 refills | Status: DC

## 2023-11-03 NOTE — Progress Notes (Signed)
 Office: [phone]  /  Fax: [phone]  WEIGHT SUMMARY AND BIOMETRICS  Weight Lost Since Last Visit: 1lb  Weight Gained Since Last Visit: 0lb   Vitals Temp: 98 F (36.7 C) BP: 139/71 Pulse Rate: (!) 59 SpO2: 97 %   Anthropometric Measurements Height: 6' (1.829 m) Weight: 300 lb (136.1 kg) BMI (Calculated): 40.68 Weight at Last Visit: 301lb Weight Lost Since Last Visit: 1lb Weight Gained Since Last Visit: 0lb Starting Weight: 339lb Total Weight Loss (lbs): 39 lb (17.7 kg)   Body Composition  Body Fat %: 42.5 % Fat Mass (lbs): 127.6 lbs Muscle Mass (lbs): 164 lbs Visceral Fat Rating : 27   Other Clinical Data Fasting: Yes Labs: No Today's Visit #: 22 Starting Date: 01/14/22     HPI  Chief Complaint: OBESITY  Geoffrey Robinson is here to discuss his progress with his obesity treatment plan. He is on the the Category 4 Plan and states he is following his eating plan approximately 50-60 % of the time. He states he is exercising 0 minutes 0 days per week.   Interval History:  Since last office visit he has lost 1 pound.  He saw his surgeon yesterday and has a follow up appt on 12/14/23 to discuss proceeding with surgery.  He is not skipping meals and is eating a protein with each meal.  He is drinking water and diet sodas daily.    Pharmacotherapy for weight loss: He is not currently taking medications for medical weight loss.     Previous pharmacotherapy for medical weight loss:  None   Bariatric surgery:  Patient has not had bariatric surgery.  Pharmacotherapy for DMT2:   He is currently taking Mounjaro 5mg  (x 3 injections).  Denies side effects.   Last A1c was 6.7 CBGs: 137-178  Episodes of hypoglycemia: Denies On ACE-lisinopril  40 mg Last eye exam:  8/24 Meds tried in the past:  Mounjaro Doesn't want to take Metformin  Lab Results  Component Value Date   HGBA1C 6.7 (H) 10/06/2023   HGBA1C 6.7 (H) 06/02/2023   HGBA1C 6.6 (H) 02/24/2023   Lab  Results  Component Value Date   LDLCALC 101 (H) 10/06/2023   CREATININE 0.88 10/06/2023    Thyroid  nodule/Hyperthyroidism Biopsy May 2nd-benign thyroid  FNA results.    Cardiology stopped his amio and started him on digoxin  0.125mg  (started Dec 1st).  Hasn't discussed with cardiology.  I asked him to after his last lab results.    Vit D deficiency  He is taking Vit D 50,000 IU weekly.  Denies side effects.  Denies nausea, vomiting or muscle weakness.    Lab Results  Component Value Date   VD25OH 36.6 10/06/2023   VD25OH 40.3 06/02/2023   VD25OH 43.1 02/24/2023     PHYSICAL EXAM:  Blood pressure 139/71, pulse (!) 59, temperature 98 F (36.7 C), height 6' (1.829 m), weight 300 lb (136.1 kg), SpO2 97%. Body mass index is 40.69 kg/m.  General: He is overweight, cooperative, alert, well developed, and in no acute distress. PSYCH: Has normal mood, affect and thought process.   Extremities: No edema.  Neurologic: No gross sensory or motor deficits. No tremors or fasciculations noted.    DIAGNOSTIC DATA REVIEWED:  BMET    Component Value Date/Time   NA 144 10/06/2023 0934   K 4.9 10/06/2023 0934   CL 105 10/06/2023 0934   CO2 21 10/06/2023 0934   GLUCOSE 163 (H) 10/06/2023 0934   GLUCOSE 215 (H) 09/07/2019 0800   BUN  19 10/06/2023 0934   CREATININE 0.88 10/06/2023 0934   CREATININE 0.99 03/11/2016 0853   CALCIUM 9.7 10/06/2023 0934   GFRNONAA >60 08/15/2019 0820   GFRAA >60 08/15/2019 0820   Lab Results  Component Value Date   HGBA1C 6.7 (H) 10/06/2023   HGBA1C  04/10/2009    5.9 (NOTE) The ADA recommends the following therapeutic goal for glycemic control related to Hgb A1c measurement: Goal of therapy: <6.5 Hgb A1c  Reference: American Diabetes Association: Clinical Practice Recommendations 2010, Diabetes Care, 2010, 33: (Suppl  1).   Lab Results  Component Value Date   INSULIN  30.0 (H) 06/30/2022   INSULIN  27.1 (H) 01/14/2022   Lab Results  Component Value  Date   TSH 0.364 (L) 10/06/2023   CBC    Component Value Date/Time   WBC 5.4 05/11/2023 0848   WBC 5.0 03/11/2016 0853   RBC 5.06 05/11/2023 0848   RBC 4.82 03/11/2016 0853   HGB 13.6 05/11/2023 0848   HCT 41.7 05/11/2023 0848   PLT 224 05/11/2023 0848   MCV 82 05/11/2023 0848   MCH 26.9 05/11/2023 0848   MCH 29.0 03/11/2016 0853   MCHC 32.6 05/11/2023 0848   MCHC 34.2 03/11/2016 0853   RDW 15.2 05/11/2023 0848   Iron Studies No results found for: "IRON", "TIBC", "FERRITIN", "IRONPCTSAT" Lipid Panel     Component Value Date/Time   CHOL 161 10/06/2023 0934   TRIG 174 (H) 10/06/2023 0934   HDL 29 (L) 10/06/2023 0934   CHOLHDL 4.6 04/11/2009 0451   VLDL 20 04/11/2009 0451   LDLCALC 101 (H) 10/06/2023 0934   Hepatic Function Panel     Component Value Date/Time   PROT 7.1 10/06/2023 0934   ALBUMIN 4.3 10/06/2023 0934   AST 21 10/06/2023 0934   ALT 26 10/06/2023 0934   ALKPHOS 87 10/06/2023 0934   BILITOT 0.4 10/06/2023 0934      Component Value Date/Time   TSH 0.364 (L) 10/06/2023 0934   Nutritional Lab Results  Component Value Date   VD25OH 36.6 10/06/2023   VD25OH 40.3 06/02/2023   VD25OH 43.1 02/24/2023     ASSESSMENT AND PLAN  TREATMENT PLAN FOR OBESITY:  Recommended Dietary Goals  Pranith is currently in the action stage of change. As such, his goal is to continue weight management plan. He has agreed to the Category 4 Plan.  Behavioral Intervention  We discussed the following Behavioral Modification Strategies today: increasing lean protein intake to established goals, decreasing simple carbohydrates , increasing vegetables, increasing fiber rich foods, increasing water intake , and continue to work on maintaining a reduced calorie state, getting the recommended amount of protein, incorporating whole foods, making healthy choices, staying well hydrated and practicing mindfulness when eating..  Additional resources provided today: NA  Recommended  Physical Activity Goals  Yonael has been advised to work up to 150 minutes of moderate intensity aerobic activity a week and strengthening exercises 2-3 times per week for cardiovascular health, weight loss maintenance and preservation of muscle mass.   He has agreed to Think about enjoyable ways to increase daily physical activity and overcoming barriers to exercise and Increase physical activity in their day and reduce sedentary time (increase NEAT).   ASSOCIATED CONDITIONS ADDRESSED TODAY  Action/Plan  Type 2 diabetes mellitus with obesity (HCC) -     Increase Tirzepatide; Inject 7.5 mg into the skin once a week.  Dispense: 2 mL; Refill: 0. Side effects discussed.    Hyperthyroidism -  TSH -     T4, free -     T3  I've asked him to reach out to cardiology today.  Discussed side effects of hyperthyroidism-pt has a flutter, a fib-can worsen both.  Needs to discuss with cardiology.   Discussed referral to endo based upon results  Thyroid  nodule Biopsy was benign.  Vitamin D  insufficiency -     Vitamin D  (Ergocalciferol ); Take 1 capsule (50,000 Units total) by mouth every 7 (seven) days.  Dispense: 5 capsule; Refill: 0  Class 3 severe obesity due to excess calories with serious comorbidity and body mass index (BMI) of 40.0 to 44.9 in adult      Labs reviewed in chart with patient 10/06/23   Return in about 4 weeks (around 12/01/2023).Aaron Aas He was informed of the importance of frequent follow up visits to maximize his success with intensive lifestyle modifications for his multiple health conditions.   ATTESTASTION STATEMENTS:  Reviewed by clinician on day of visit: allergies, medications, problem list, medical history, surgical history, family history, social history, and previous encounter notes.    Crist Dominion. Dallin Mccorkel FNP-C

## 2023-11-04 ENCOUNTER — Telehealth: Payer: Self-pay | Admitting: Nurse Practitioner

## 2023-11-04 ENCOUNTER — Other Ambulatory Visit: Payer: Self-pay | Admitting: Nurse Practitioner

## 2023-11-04 DIAGNOSIS — E0590 Thyrotoxicosis, unspecified without thyrotoxic crisis or storm: Secondary | ICD-10-CM

## 2023-11-04 LAB — T3: T3, Total: 133 ng/dL (ref 71–180)

## 2023-11-04 LAB — T4, FREE: Free T4: 1.47 ng/dL (ref 0.82–1.77)

## 2023-11-04 LAB — TSH: TSH: 0.364 u[IU]/mL — ABNORMAL LOW (ref 0.450–4.500)

## 2023-11-04 NOTE — Telephone Encounter (Signed)
 Talked with patient's wife and stressed the importance of contacting cardiology about hyperthyroidism and I referred him to endo.

## 2023-11-10 ENCOUNTER — Encounter: Payer: Self-pay | Admitting: Nurse Practitioner

## 2023-11-12 ENCOUNTER — Institutional Professional Consult (permissible substitution) (INDEPENDENT_AMBULATORY_CARE_PROVIDER_SITE_OTHER): Admitting: Otolaryngology

## 2023-11-29 ENCOUNTER — Encounter: Payer: Self-pay | Admitting: Internal Medicine

## 2023-12-08 ENCOUNTER — Ambulatory Visit: Admitting: Nurse Practitioner

## 2023-12-08 ENCOUNTER — Encounter: Payer: Self-pay | Admitting: Nurse Practitioner

## 2023-12-08 VITALS — BP 133/72 | HR 61 | Temp 98.1°F | Ht 72.0 in | Wt 293.0 lb

## 2023-12-08 DIAGNOSIS — E0590 Thyrotoxicosis, unspecified without thyrotoxic crisis or storm: Secondary | ICD-10-CM

## 2023-12-08 DIAGNOSIS — E1169 Type 2 diabetes mellitus with other specified complication: Secondary | ICD-10-CM

## 2023-12-08 DIAGNOSIS — E0500 Thyrotoxicosis with diffuse goiter without thyrotoxic crisis or storm: Secondary | ICD-10-CM | POA: Diagnosis not present

## 2023-12-08 DIAGNOSIS — E559 Vitamin D deficiency, unspecified: Secondary | ICD-10-CM

## 2023-12-08 DIAGNOSIS — E66812 Obesity, class 2: Secondary | ICD-10-CM | POA: Diagnosis not present

## 2023-12-08 DIAGNOSIS — Z7985 Long-term (current) use of injectable non-insulin antidiabetic drugs: Secondary | ICD-10-CM

## 2023-12-08 DIAGNOSIS — E041 Nontoxic single thyroid nodule: Secondary | ICD-10-CM

## 2023-12-08 DIAGNOSIS — Z6839 Body mass index (BMI) 39.0-39.9, adult: Secondary | ICD-10-CM

## 2023-12-08 MED ORDER — TIRZEPATIDE 7.5 MG/0.5ML ~~LOC~~ SOAJ: 7.5000 mg | SUBCUTANEOUS | 0 refills | Status: DC

## 2023-12-08 MED ORDER — VITAMIN D (ERGOCALCIFEROL) 1.25 MG (50000 UNIT) PO CAPS: 50000.0000 [IU] | ORAL_CAPSULE | ORAL | 0 refills | Status: DC

## 2023-12-08 NOTE — Progress Notes (Signed)
 Office: [phone]  /  Fax: [phone]  WEIGHT SUMMARY AND BIOMETRICS  Weight Lost Since Last Visit: 7lb  Weight Gained Since Last Visit: 0lb   Vitals Temp: 98.1 F (36.7 C) BP: 133/72 Pulse Rate: 61 SpO2: 97 %   Anthropometric Measurements Height: 6' (1.829 m) Weight: 293 lb (132.9 kg) BMI (Calculated): 39.73 Weight at Last Visit: 300lb Weight Lost Since Last Visit: 7lb Weight Gained Since Last Visit: 0lb Starting Weight: 339lb Total Weight Loss (lbs): 46 lb (20.9 kg)   Body Composition  Body Fat %: 41.9 % Fat Mass (lbs): 122.8 lbs Muscle Mass (lbs): 161.8 lbs Total Body Water (lbs): 139.6 lbs Visceral Fat Rating : 26   Other Clinical Data Fasting: Yes Labs: No Today's Visit #: 23 Starting Date: 01/14/22     HPI  Chief Complaint: OBESITY  Seger is here to discuss his progress with his obesity treatment plan. He is on the the Category 4 Plan and states he is following his eating plan approximately 60 % of the time. He states he is exercising 0 minutes 0 days per week.   Interval History:  Since last office visit he has lost 7 pounds.  He has been focusing on eating more protein and decreasing his carbs intake.  He has been drinking more water and a diet soda daily.  He reports that he feels well and is moving more/has been more active.  He is wearing compression socks daily and feels that has been beneficial.  He has been walking more and working in the yard.  He started riding his bike last week. He continues to struggle with knee pain and has an appt with ortho on 12/14/23 to discuss surgery.    Pharmacotherapy for weight loss: He is not currently taking medications for medical weight loss.     Previous pharmacotherapy for medical weight loss:  None   Bariatric surgery:  Patient has not had bariatric surgery.  Pharmacotherapy for DMT2:  He is currently taking Mounjaro  7.5mg .  Denies side effects.   Last A1c was 6.7 CBGs: 137-178  Episodes of  hypoglycemia: Denies On ACE-lisinopril  40 mg Last eye exam:  8/24 Meds tried in the past:  Mounjaro  Doesn't want to take Metformin  Lab Results  Component Value Date   HGBA1C 6.7 (H) 10/06/2023   HGBA1C 6.7 (H) 06/02/2023   HGBA1C 6.6 (H) 02/24/2023   Lab Results  Component Value Date   LDLCALC 101 (H) 10/06/2023   CREATININE 0.88 10/06/2023    Thyroid  nodule/Hyperthyroidism Biopsy May 2nd-benign thyroid  FNA results.    Cardiology stopped his amio and started him on digoxin  0.125mg  (started Dec 1st).  Discussed recently labs with cardiology  Has appt with Endo on 01/18/24     Vit D deficiency  He is taking Vit D 50,000 IU weekly.  Denies side effects.  Denies nausea, vomiting or muscle weakness.    Lab Results  Component Value Date   VD25OH 36.6 10/06/2023   VD25OH 40.3 06/02/2023   VD25OH 43.1 02/24/2023     PHYSICAL EXAM:  Blood pressure 133/72, pulse 61, temperature 98.1 F (36.7 C), height 6' (1.829 m), weight 293 lb (132.9 kg), SpO2 97%. Body mass index is 39.74 kg/m.  General: He is overweight, cooperative, alert, well developed, and in no acute distress. PSYCH: Has normal mood, affect and thought process.   Extremities: No edema.  Neurologic: No gross sensory or motor deficits. No tremors or fasciculations noted.    DIAGNOSTIC DATA REVIEWED:  BMET  Component Value Date/Time   NA 144 10/06/2023 0934   K 4.9 10/06/2023 0934   CL 105 10/06/2023 0934   CO2 21 10/06/2023 0934   GLUCOSE 163 (H) 10/06/2023 0934   GLUCOSE 215 (H) 09/07/2019 0800   BUN 19 10/06/2023 0934   CREATININE 0.88 10/06/2023 0934   CREATININE 0.99 03/11/2016 0853   CALCIUM 9.7 10/06/2023 0934   GFRNONAA >60 08/15/2019 0820   GFRAA >60 08/15/2019 0820   Lab Results  Component Value Date   HGBA1C 6.7 (H) 10/06/2023   HGBA1C  04/10/2009    5.9 (NOTE) The ADA recommends the following therapeutic goal for glycemic control related to Hgb A1c measurement: Goal of therapy: <6.5  Hgb A1c  Reference: American Diabetes Association: Clinical Practice Recommendations 2010, Diabetes Care, 2010, 33: (Suppl  1).   Lab Results  Component Value Date   INSULIN  30.0 (H) 06/30/2022   INSULIN  27.1 (H) 01/14/2022   Lab Results  Component Value Date   TSH 0.364 (L) 11/03/2023   CBC    Component Value Date/Time   WBC 5.4 05/11/2023 0848   WBC 5.0 03/11/2016 0853   RBC 5.06 05/11/2023 0848   RBC 4.82 03/11/2016 0853   HGB 13.6 05/11/2023 0848   HCT 41.7 05/11/2023 0848   PLT 224 05/11/2023 0848   MCV 82 05/11/2023 0848   MCH 26.9 05/11/2023 0848   MCH 29.0 03/11/2016 0853   MCHC 32.6 05/11/2023 0848   MCHC 34.2 03/11/2016 0853   RDW 15.2 05/11/2023 0848   Iron Studies No results found for: IRON, TIBC, FERRITIN, IRONPCTSAT Lipid Panel     Component Value Date/Time   CHOL 161 10/06/2023 0934   TRIG 174 (H) 10/06/2023 0934   HDL 29 (L) 10/06/2023 0934   CHOLHDL 4.6 04/11/2009 0451   VLDL 20 04/11/2009 0451   LDLCALC 101 (H) 10/06/2023 0934   Hepatic Function Panel     Component Value Date/Time   PROT 7.1 10/06/2023 0934   ALBUMIN 4.3 10/06/2023 0934   AST 21 10/06/2023 0934   ALT 26 10/06/2023 0934   ALKPHOS 87 10/06/2023 0934   BILITOT 0.4 10/06/2023 0934      Component Value Date/Time   TSH 0.364 (L) 11/03/2023 0825   Nutritional Lab Results  Component Value Date   VD25OH 36.6 10/06/2023   VD25OH 40.3 06/02/2023   VD25OH 43.1 02/24/2023     ASSESSMENT AND PLAN  TREATMENT PLAN FOR OBESITY:  Recommended Dietary Goals  Elma is currently in the action stage of change. As such, his goal is to continue weight management plan. He has agreed to practicing portion control and making smarter food choices, such as increasing vegetables and decreasing simple carbohydrates.  Behavioral Intervention  We discussed the following Behavioral Modification Strategies today: increasing lean protein intake to established goals, decreasing simple  carbohydrates , increasing vegetables, increasing fiber rich foods, increasing water intake , work on meal planning and preparation, and continue to work on maintaining a reduced calorie state, getting the recommended amount of protein, incorporating whole foods, making healthy choices, staying well hydrated and practicing mindfulness when eating..  Additional resources provided today: NA  Recommended Physical Activity Goals  Rylee has been advised to work up to 150 minutes of moderate intensity aerobic activity a week and strengthening exercises 2-3 times per week for cardiovascular health, weight loss maintenance and preservation of muscle mass.   He has agreed to Think about enjoyable ways to increase daily physical activity and overcoming barriers to exercise,  Increase physical activity in their day and reduce sedentary time (increase NEAT)., and continue to gradually increase the amount and intensity of exercise routine   ASSOCIATED CONDITIONS ADDRESSED TODAY  Action/Plan  Type 2 diabetes mellitus with obesity (HCC) -     Continue Tirzepatide ; Inject 7.5 mg into the skin once a week.  Dispense: 2 mL; Refill: 0. Side effects discussed  Hyperthyroidism Keep appt with endo  Thyroid  nodule Keep appt with endo  Vitamin D  insufficiency -     Vitamin D  (Ergocalciferol ); Take 1 capsule (50,000 Units total) by mouth every 7 (seven) days.  Dispense: 5 capsule; Refill: 0  Class 2 severe obesity due to excess calories with serious comorbidity and body mass index (BMI) of 39.0 to 39.9 in adult [facility])         Return in about 4 weeks (around 01/05/2024).Aaron Aas He was informed of the importance of frequent follow up visits to maximize his success with intensive lifestyle modifications for his multiple health conditions.   ATTESTASTION STATEMENTS:  Reviewed by clinician on day of visit: allergies, medications, problem list, medical history, surgical history, family history, social history, and  previous encounter notes.    Crist Dominion. Jong Rickman FNP-C

## 2023-12-15 ENCOUNTER — Encounter: Payer: Self-pay | Admitting: Nurse Practitioner

## 2023-12-27 ENCOUNTER — Ambulatory Visit: Admitting: Internal Medicine

## 2023-12-28 ENCOUNTER — Other Ambulatory Visit: Payer: Self-pay | Admitting: Nurse Practitioner

## 2023-12-28 DIAGNOSIS — E669 Obesity, unspecified: Secondary | ICD-10-CM

## 2023-12-29 ENCOUNTER — Encounter: Payer: Self-pay | Admitting: Internal Medicine

## 2023-12-29 ENCOUNTER — Ambulatory Visit: Admitting: Internal Medicine

## 2023-12-29 ENCOUNTER — Telehealth: Payer: Self-pay | Admitting: Internal Medicine

## 2023-12-29 VITALS — BP 130/70 | HR 65 | Temp 98.2°F | Resp 17 | Ht 72.0 in | Wt 293.4 lb

## 2023-12-29 DIAGNOSIS — E785 Hyperlipidemia, unspecified: Secondary | ICD-10-CM | POA: Diagnosis not present

## 2023-12-29 DIAGNOSIS — I10 Essential (primary) hypertension: Secondary | ICD-10-CM

## 2023-12-29 DIAGNOSIS — I4892 Unspecified atrial flutter: Secondary | ICD-10-CM

## 2023-12-29 DIAGNOSIS — I4819 Other persistent atrial fibrillation: Secondary | ICD-10-CM

## 2023-12-29 DIAGNOSIS — E1169 Type 2 diabetes mellitus with other specified complication: Secondary | ICD-10-CM | POA: Diagnosis not present

## 2023-12-29 DIAGNOSIS — K219 Gastro-esophageal reflux disease without esophagitis: Secondary | ICD-10-CM

## 2023-12-29 DIAGNOSIS — E669 Obesity, unspecified: Secondary | ICD-10-CM

## 2023-12-29 MED ORDER — OMEPRAZOLE 40 MG PO CPDR: 40.0000 mg | DELAYED_RELEASE_CAPSULE | Freq: Every day | ORAL | 1 refills | Status: DC

## 2023-12-29 NOTE — Telephone Encounter (Signed)
 Refill sent.

## 2023-12-29 NOTE — Progress Notes (Signed)
 New Patient Office Visit     CC/Reason for Visit: Establish care, preoperative clearance Previous PCP: Prentice Blush, MD Last Visit: Approximately 1 year ago  HPI: Geoffrey Robinson is a 63 y.o. male who is coming in today for the above mentioned reasons. Past Medical History is significant for: Hypertension, hyperlipidemia, A-fib anticoagulated, BPH, vitamin D  deficiency, GERD, type 2 diabetes that is well-controlled, nonischemic cardiomyopathy.  He will be having a right knee replacement and is needing medical clearance, he has already been cleared from cardiology standpoint.  He is feeling well and has no acute concerns or complaints.  Is overdue for his annual wellness visit.   Past Medical/Surgical History: Past Medical History:  Diagnosis Date   Ascending aorta dilatation (HCC)    41mm by CT>>normal when indexed for BSA   Back pain    Bradycardia 10/21/2015   Dyslipidemia    Heart burn    High cholesterol    HTN (hypertension)    Joint pain    NICM (nonischemic cardiomyopathy) (HCC)    a. LHC 2002: no CAD, EF 35-40%; b. Echo 2003: EF 60% - tachycardia mediated (AFlutter with RVR).  EF 50% on echo 11/2020   Obesity    Palpitation    Persistent atrial fibrillation (HCC)    CHADS2VASC score is 2.  s/p remote afib and aflutter ablations   Pre-diabetes    Severe obstructive sleep apnea    , CPAP at 14cm H2O   Swelling of lower extremity    Varicose veins     Past Surgical History:  Procedure Laterality Date   CARDIAC SURGERY     CARDIOVERSION N/A 09/07/2019   Procedure: CARDIOVERSION;  Surgeon: Loni Soyla LABOR, MD;  Location: [facility] ENDOSCOPY;  Service: Cardiovascular;  Laterality: N/A;   COLONOSCOPY      Social History:  reports that he has never smoked. He has never used smokeless tobacco. He reports current alcohol use of about 3.0 standard drinks of alcohol per week. He reports that he does not use drugs.  Allergies: Allergies  Allergen Reactions   Plavix  [Clopidogrel Bisulfate] Rash    Family History:  Family History  Problem Relation Age of Onset   Hypertension Mother    Diabetes Mother    High blood pressure Mother    High Cholesterol Mother    Heart disease Mother    Sleep apnea Mother    Hypertension Father    Heart disease Father    High Cholesterol Father      Current Outpatient Medications:    acetaminophen (TYLENOL) 500 MG tablet, Take 1,000 mg by mouth every 6 (six) hours as needed for moderate pain., Disp: , Rfl:    digoxin  (LANOXIN ) 0.125 MG tablet, Take 1 tablet (0.125 mg total) by mouth daily., Disp: 90 tablet, Rfl: 3   diltiazem  (CARDIZEM  CD) 360 MG 24 hr capsule, TAKE 1 CAPSULE BY MOUTH EVERY DAY, Disp: 90 capsule, Rfl: 3   fenofibrate (TRICOR) 145 MG tablet, Take 145 mg by mouth daily., Disp: , Rfl:    finasteride (PROSCAR) 5 MG tablet, Take 5 mg by mouth daily., Disp: , Rfl:    furosemide  (LASIX ) 20 MG tablet, TAKE 1 TABLET AS NEEDED FOR EXCESS SWELLING/WEIGHT GAIN 2-3LBS PER DAY OR 5LBS PER WEEK, Disp: 90 tablet, Rfl: 3   lisinopril  (ZESTRIL ) 40 MG tablet, Take 1 tablet (40 mg total) by mouth daily., Disp: 90 tablet, Rfl: 3   metoprolol  tartrate (LOPRESSOR ) 100 MG tablet, TAKE 1 TABLET BY  MOUTH EVERY MORNING AND 1&1/2 TABLETS EVERY EVENING, Disp: 225 tablet, Rfl: 3   omeprazole  (PRILOSEC) 40 MG capsule, Take 40 mg by mouth daily. , Disp: , Rfl:    tamsulosin (FLOMAX) 0.4 MG CAPS capsule, Take 0.4 mg by mouth at bedtime., Disp: , Rfl:    tirzepatide  (MOUNJARO ) 7.5 MG/0.5ML Pen, Inject 7.5 mg into the skin once a week., Disp: 2 mL, Rfl: 0   Vitamin D , Ergocalciferol , (DRISDOL ) 1.25 MG (50000 UNIT) CAPS capsule, Take 1 capsule (50,000 Units total) by mouth every 7 (seven) days., Disp: 5 capsule, Rfl: 0   XARELTO  20 MG TABS tablet, TAKE ONE TABLET BY MOUTH ONE TIME DAILY WITH SUPPER, Disp: 90 tablet, Rfl: 1  Review of Systems:  Negative except as indicated in HPI.   Physical Exam: Vitals:   12/29/23 1421  BP:  130/70  Pulse: 65  Resp: 17  Temp: 98.2 F (36.8 C)  SpO2: 98%  Weight: 293 lb 6.4 oz (133.1 kg)  Height: 6' (1.829 m)   Body mass index is 39.79 kg/m.  Physical Exam Vitals reviewed.  Constitutional:      Appearance: Normal appearance. He is obese.  HENT:     Head: Normocephalic and atraumatic.  Eyes:     Conjunctiva/sclera: Conjunctivae normal.  Cardiovascular:     Rate and Rhythm: Normal rate. Rhythm irregular.  Pulmonary:     Effort: Pulmonary effort is normal.     Breath sounds: Normal breath sounds.  Musculoskeletal:     Right lower leg: Edema present.     Left lower leg: Edema present.  Skin:    General: Skin is warm and dry.  Neurological:     General: No focal deficit present.     Mental Status: He is alert and oriented to person, place, and time.  Psychiatric:        Mood and Affect: Mood normal.        Behavior: Behavior normal.        Thought Content: Thought content normal.        Judgment: Judgment normal.        Impression and Plan:  Persistent atrial fibrillation (HCC)  Primary hypertension  Dyslipidemia  Type 2 diabetes mellitus with obesity (HCC)  Gastroesophageal reflux disease, unspecified whether esophagitis present  Atrial flutter, unspecified type (HCC)    -A-flutter is stable, rate controlled, anticoagulated on Xarelto , followed by cardiology. - Blood pressure is well-controlled. - Recent A1c of 6.7 demonstrates excellent diabetic management. - Check cholesterol next visit.  Time spent: 46 minutes reviewing chart, interviewing and examining patient and formulating plan of care.       Tully Theophilus Andrews, MD [facility]

## 2023-12-29 NOTE — Telephone Encounter (Signed)
 Patient came to the desk after being seen and stated he forgot to mention he needs the following refilled:  omeprazole  (PRILOSEC) 40 MG capsule   [id number] - [place], [place] - [address] Phone: [phone]  Fax: [phone]     Please advise at [phone]

## 2023-12-30 ENCOUNTER — Other Ambulatory Visit: Payer: Self-pay | Admitting: Nurse Practitioner

## 2023-12-30 DIAGNOSIS — E1169 Type 2 diabetes mellitus with other specified complication: Secondary | ICD-10-CM

## 2024-01-12 ENCOUNTER — Encounter: Payer: Self-pay | Admitting: Nurse Practitioner

## 2024-01-12 ENCOUNTER — Ambulatory Visit: Admitting: Nurse Practitioner

## 2024-01-12 VITALS — BP 126/69 | HR 56 | Temp 97.6°F | Ht 72.0 in | Wt 287.0 lb

## 2024-01-12 DIAGNOSIS — Z7985 Long-term (current) use of injectable non-insulin antidiabetic drugs: Secondary | ICD-10-CM

## 2024-01-12 DIAGNOSIS — E0590 Thyrotoxicosis, unspecified without thyrotoxic crisis or storm: Secondary | ICD-10-CM | POA: Diagnosis not present

## 2024-01-12 DIAGNOSIS — E66812 Obesity, class 2: Secondary | ICD-10-CM | POA: Diagnosis not present

## 2024-01-12 DIAGNOSIS — E1169 Type 2 diabetes mellitus with other specified complication: Secondary | ICD-10-CM

## 2024-01-12 DIAGNOSIS — Z6838 Body mass index (BMI) 38.0-38.9, adult: Secondary | ICD-10-CM

## 2024-01-12 DIAGNOSIS — E559 Vitamin D deficiency, unspecified: Secondary | ICD-10-CM

## 2024-01-12 MED ORDER — TIRZEPATIDE 7.5 MG/0.5ML ~~LOC~~ SOAJ: 7.5000 mg | SUBCUTANEOUS | 0 refills | Status: DC

## 2024-01-12 MED ORDER — VITAMIN D (ERGOCALCIFEROL) 1.25 MG (50000 UNIT) PO CAPS: 50000.0000 [IU] | ORAL_CAPSULE | ORAL | 0 refills | Status: DC

## 2024-01-12 NOTE — Progress Notes (Signed)
 Office: [phone]  /  Fax: [phone]  WEIGHT SUMMARY AND BIOMETRICS  Weight Lost Since Last Visit: 6lb  Weight Gained Since Last Visit: 0lb   Vitals Temp: 97.6 F (36.4 C) BP: 126/69 Pulse Rate: (!) 56 SpO2: 98 %   Anthropometric Measurements Height: 6' (1.829 m) Weight: 287 lb (130.2 kg) BMI (Calculated): 38.92 Weight at Last Visit: 293lb Weight Lost Since Last Visit: 6lb Weight Gained Since Last Visit: 0lb Starting Weight: 339lb Total Weight Loss (lbs): 52 lb (23.6 kg)   Body Composition  Body Fat %: 41.4 % Fat Mass (lbs): 119 lbs Muscle Mass (lbs): 160.2 lbs Visceral Fat Rating : 25   Other Clinical Data Fasting: No Labs: no Today's Visit #: 24 Starting Date: 01/14/22     HPI  Chief Complaint: OBESITY  Geoffrey Robinson is here to discuss his progress with his obesity treatment plan. He is on the the Category 4 Plan and states he is following his eating plan approximately 50 % of the time. He states he is swimming for 30-60 minutes 3-4 days per week.   Interval History:  Since last office visit he has lost 6 pounds.  He is drinking water and one diet soda daily.  He is scheduled for right knee surgery on 02/17/24 and plans to have the other knee done in the future.  He is wearing compression socks daily. He has been walking more, swimming, riding his bike and working in the yard.    His highest weight was 392lbs.  Pharmacotherapy for weight loss: He is not currently taking medications for medical weight loss.     Previous pharmacotherapy for medical weight loss:  None   Bariatric surgery:  Patient has not had bariatric surgery.   Pharmacotherapy for DMT2:   He is currently taking Mounjaro  7.5mg .  Denies side effects.   Last A1c was 6.7 CBGs: 140s Episodes of hypoglycemia: None On ACE-lisinopril  40 mg Last eye exam:  8/24-plans to schedule Meds tried in the past:  Mounjaro  Doesn't want to take Metformin   Lab Results  Component Value Date    HGBA1C 6.7 (H) 10/06/2023   HGBA1C 6.7 (H) 06/02/2023   HGBA1C 6.6 (H) 02/24/2023   Lab Results  Component Value Date   LDLCALC 101 (H) 10/06/2023   CREATININE 0.88 10/06/2023    Vit D deficiency  He is taking Vit D 50,000 IU weekly.  Denies side effects.  Denies nausea, vomiting or muscle weakness.    Lab Results  Component Value Date   VD25OH 36.6 10/06/2023   VD25OH 40.3 06/02/2023   VD25OH 43.1 02/24/2023     Hyperthyroid -Biopsy May 2nd-benign thyroid  FNA results.    -Cardiology stopped his amio and started him on digoxin  0.125mg  (started Dec 1st).   -Discussed labs with cardiology   -Has appt with Endo on 01/18/24  -denies palpitations    PHYSICAL EXAM:  Blood pressure 126/69, pulse (!) 56, temperature 97.6 F (36.4 C), height 6' (1.829 m), weight 287 lb (130.2 kg), SpO2 98%. Body mass index is 38.92 kg/m.  General: He is overweight, cooperative, alert, well developed, and in no acute distress. PSYCH: Has normal mood, affect and thought process.   Extremities: No edema.  Neurologic: No gross sensory or motor deficits. No tremors or fasciculations noted.    DIAGNOSTIC DATA REVIEWED:  BMET    Component Value Date/Time   NA 144 10/06/2023 0934   K 4.9 10/06/2023 0934   CL 105 10/06/2023 0934   CO2 21 10/06/2023  0934   GLUCOSE 163 (H) 10/06/2023 0934   GLUCOSE 215 (H) 09/07/2019 0800   BUN 19 10/06/2023 0934   CREATININE 0.88 10/06/2023 0934   CREATININE 0.99 03/11/2016 0853   CALCIUM 9.7 10/06/2023 0934   GFRNONAA >60 08/15/2019 0820   GFRAA >60 08/15/2019 0820   Lab Results  Component Value Date   HGBA1C 6.7 (H) 10/06/2023   HGBA1C  04/10/2009    5.9 (NOTE) The ADA recommends the following therapeutic goal for glycemic control related to Hgb A1c measurement: Goal of therapy: <6.5 Hgb A1c  Reference: American Diabetes Association: Clinical Practice Recommendations 2010, Diabetes Care, 2010, 33: (Suppl  1).   Lab Results  Component Value Date    INSULIN  30.0 (H) 06/30/2022   INSULIN  27.1 (H) 01/14/2022   Lab Results  Component Value Date   TSH 0.364 (L) 11/03/2023   CBC    Component Value Date/Time   WBC 5.4 05/11/2023 0848   WBC 5.0 03/11/2016 0853   RBC 5.06 05/11/2023 0848   RBC 4.82 03/11/2016 0853   HGB 13.6 05/11/2023 0848   HCT 41.7 05/11/2023 0848   PLT 224 05/11/2023 0848   MCV 82 05/11/2023 0848   MCH 26.9 05/11/2023 0848   MCH 29.0 03/11/2016 0853   MCHC 32.6 05/11/2023 0848   MCHC 34.2 03/11/2016 0853   RDW 15.2 05/11/2023 0848   Iron Studies No results found for: IRON, TIBC, FERRITIN, IRONPCTSAT Lipid Panel     Component Value Date/Time   CHOL 161 10/06/2023 0934   TRIG 174 (H) 10/06/2023 0934   HDL 29 (L) 10/06/2023 0934   CHOLHDL 4.6 04/11/2009 0451   VLDL 20 04/11/2009 0451   LDLCALC 101 (H) 10/06/2023 0934   Hepatic Function Panel     Component Value Date/Time   PROT 7.1 10/06/2023 0934   ALBUMIN 4.3 10/06/2023 0934   AST 21 10/06/2023 0934   ALT 26 10/06/2023 0934   ALKPHOS 87 10/06/2023 0934   BILITOT 0.4 10/06/2023 0934      Component Value Date/Time   TSH 0.364 (L) 11/03/2023 0825   Nutritional Lab Results  Component Value Date   VD25OH 36.6 10/06/2023   VD25OH 40.3 06/02/2023   VD25OH 43.1 02/24/2023     ASSESSMENT AND PLAN  TREATMENT PLAN FOR OBESITY:  Recommended Dietary Goals  Geoffrey Robinson is currently in the action stage of change. As such, his goal is to continue weight management plan. He has agreed to the Category 4 Plan.  Behavioral Intervention  We discussed the following Behavioral Modification Strategies today: increasing lean protein intake to established goals, decreasing simple carbohydrates , increasing vegetables, increasing fiber rich foods, increasing water intake , and continue to work on maintaining a reduced calorie state, getting the recommended amount of protein, incorporating whole foods, making healthy choices, staying well hydrated and  practicing mindfulness when eating..  Additional resources provided today: NA  Recommended Physical Activity Goals  He has agreed to exercise per surgeon recommendations   ASSOCIATED CONDITIONS ADDRESSED TODAY  Action/Plan  Type 2 diabetes mellitus with obesity (HCC) -     continue Tirzepatide ; Inject 7.5 mg into the skin once a week.  Dispense: 2 mL; Refill: 0.  Side effects discussed.   Vitamin D  insufficiency -     continue  D (Ergocalciferol ); Take 1 capsule (50,000 Units total) by mouth every 7 (seven) days.  Dispense: 5 capsule; Refill: 0. Side effects discussed   Hyperthyroidism Keep appt with endo  Obesity, Class II, BMI 35-39.9  Will stop Vit D and Mounjaro  2 weeks prior to surgery Keep appt with endo    No follow-ups on file.SABRA He was informed of the importance of frequent follow up visits to maximize his success with intensive lifestyle modifications for his multiple health conditions.   ATTESTASTION STATEMENTS:  Reviewed by clinician on day of visit: allergies, medications, problem list, medical history, surgical history, family history, social history, and previous encounter notes.    Geoffrey Robinson. Burdette Gergely FNP-C

## 2024-01-18 ENCOUNTER — Encounter: Payer: Self-pay | Admitting: Internal Medicine

## 2024-01-18 ENCOUNTER — Ambulatory Visit: Admitting: Internal Medicine

## 2024-01-18 VITALS — BP 122/70 | HR 62 | Ht 72.0 in | Wt 293.0 lb

## 2024-01-18 DIAGNOSIS — E042 Nontoxic multinodular goiter: Secondary | ICD-10-CM | POA: Diagnosis not present

## 2024-01-18 DIAGNOSIS — E0590 Thyrotoxicosis, unspecified without thyrotoxic crisis or storm: Secondary | ICD-10-CM

## 2024-01-18 NOTE — Progress Notes (Unsigned)
 Name: Geoffrey Robinson  MRN/ DOB: [id number], 06-Jun-1961    Age/ Sex: 63 y.o., male    PCP: Theophilus Andrews, Tully GRADE, MD   Reason for Endocrinology Evaluation: Hyperthyroidism     Date of Initial Endocrinology Evaluation: 01/18/2024     HPI: [name] is a 63 y.o. male with a past medical history of DM, A-fib, HTN, OSA, GERD. The patient presented for initial endocrinology clinic visit on 01/18/2024 for consultative assistance with his Hyperthyroidism.   Patient has been noted with low TSH in September, 2024 at 0.367 u IU/mL, his TSH remained somewhat stable by October, 2024 with an elevated free T4 at 1.8 NG/DL and normal T3.   Thyroid  ultrasound revealed multinodular goiter in February, 2025  He is s/p benign FNA of the left mid left thyroid  nodule 2.5 cm May, 2025  In reviewing his chart, the patient has had thyroid  uptake and scan in 2007, results were consistent with acute thyroiditis  He was on Amiodarone  for ~ 3 years until 03/2023  Pt with weight loss , on mounjaro   Has rare palpitations  Denies diarrhea or loose or diarrhea  Rare hand tremors  Denies anxiety or jittery sensation  Pt scheduled for right knee sx 01/2024   No FH of thyroid  disease   HISTORY:  Past Medical History:  Past Medical History:  Diagnosis Date   Ascending aorta dilatation (HCC)    41mm by CT>>normal when indexed for BSA   Back pain    Bradycardia 10/21/2015   Dyslipidemia    Heart burn    High cholesterol    HTN (hypertension)    Joint pain    NICM (nonischemic cardiomyopathy) (HCC)    a. LHC 2002: no CAD, EF 35-40%; b. Echo 2003: EF 60% - tachycardia mediated (AFlutter with RVR).  EF 50% on echo 11/2020   Obesity    Palpitation    Persistent atrial fibrillation (HCC)    CHADS2VASC score is 2.  s/p remote afib and aflutter ablations   Pre-diabetes    Severe obstructive sleep apnea    , CPAP at 14cm H2O   Swelling of lower extremity    Varicose veins    Past  Surgical History:  Past Surgical History:  Procedure Laterality Date   CARDIAC SURGERY     CARDIOVERSION N/A 09/07/2019   Procedure: CARDIOVERSION;  Surgeon: Loni Soyla LABOR, MD;  Location: [facility] ENDOSCOPY;  Service: Cardiovascular;  Laterality: N/A;   COLONOSCOPY      Social History:  reports that he has never smoked. He has never used smokeless tobacco. He reports current alcohol use of about 3.0 standard drinks of alcohol per week. He reports that he does not use drugs. Family History: family history includes Diabetes in his mother; Heart disease in his father and mother; High Cholesterol in his father and mother; High blood pressure in his mother; Hypertension in his father and mother; Sleep apnea in his mother.   HOME MEDICATIONS: Allergies as of 01/18/2024       Reactions   Plavix [clopidogrel Bisulfate] Rash        Medication List        Accurate as of January 18, 2024  1:06 PM. If you have any questions, ask your nurse or doctor.          acetaminophen 500 MG tablet Commonly known as: TYLENOL Take 1,000 mg by mouth every 6 (six) hours as needed for moderate pain.   digoxin  0.125 MG  tablet Commonly known as: LANOXIN  Take 1 tablet (0.125 mg total) by mouth daily.   diltiazem  360 MG 24 hr capsule Commonly known as: CARDIZEM  CD TAKE 1 CAPSULE BY MOUTH EVERY DAY   fenofibrate  145 MG tablet Commonly known as: TRICOR  Take 145 mg by mouth daily.   finasteride 5 MG tablet Commonly known as: PROSCAR Take 5 mg by mouth daily.   furosemide  20 MG tablet Commonly known as: LASIX  TAKE 1 TABLET AS NEEDED FOR EXCESS SWELLING/WEIGHT GAIN 2-3LBS PER DAY OR 5LBS PER WEEK   lisinopril  40 MG tablet Commonly known as: ZESTRIL  Take 1 tablet (40 mg total) by mouth daily.   metoprolol  tartrate 100 MG tablet Commonly known as: LOPRESSOR  TAKE 1 TABLET BY MOUTH EVERY MORNING AND 1&1/2 TABLETS EVERY EVENING   omeprazole  40 MG capsule Commonly known as: PRILOSEC Take 1 capsule  (40 mg total) by mouth daily.   tamsulosin 0.4 MG Caps capsule Commonly known as: FLOMAX Take 0.4 mg by mouth at bedtime.   tirzepatide  7.5 MG/0.5ML Pen Commonly known as: MOUNJARO  Inject 7.5 mg into the skin once a week.   Vitamin D  (Ergocalciferol ) 1.25 MG (50000 UNIT) Caps capsule Commonly known as: DRISDOL  Take 1 capsule (50,000 Units total) by mouth every 7 (seven) days.   Xarelto  20 MG Tabs tablet Generic drug: rivaroxaban  TAKE ONE TABLET BY MOUTH ONE TIME DAILY WITH SUPPER          REVIEW OF SYSTEMS: A comprehensive ROS was conducted with the patient and is negative except as per HPI     OBJECTIVE:  VS: BP 122/70 (BP Location: Left Arm, Patient Position: Sitting, Cuff Size: Normal)   Pulse 62   Ht 6' (1.829 m)   Wt 293 lb (132.9 kg)   SpO2 97%   BMI 39.74 kg/m    Wt Readings from Last 3 Encounters:  01/18/24 293 lb (132.9 kg)  01/12/24 287 lb (130.2 kg)  12/29/23 293 lb 6.4 oz (133.1 kg)     EXAM: General: Pt appears well and is in NAD  Eyes: External eye exam normal without stare, lid lag or exophthalmos.  EOM intact.  PERRL.  Neck: General: Supple without adenopathy. Thyroid : Thyroid  size normal.  No goiter or nodules appreciated. No thyroid  bruit.  Lungs: Clear with good BS bilat   Heart: Auscultation: RRR.  Extremities:  BL LE: No pretibial edema   Mental Status: Judgment, insight: Intact Orientation: Oriented to time, place, and person Mood and affect: No depression, anxiety, or agitation     DATA REVIEWED:     Latest Reference Range & Units 01/18/24 13:33  TSH 0.40 - 4.50 mIU/L 0.26 (L)  Triiodothyronine,Free,Serum 2.3 - 4.2 pg/mL 3.5  T4,Free(Direct) 0.8 - 1.8 ng/dL 1.3  Thyroid  stimulating immunoglobulin  Rpt (IP)   Thyroid  ultrasound 08/13/2023   Estimated total number of nodules >/= 1 cm: 4   Number of spongiform nodules >/=  2 cm not described below (TR1): 0   Number of mixed cystic and solid nodules >/= 1.5 cm not  described below (TR2): 0   _________________________________________________________   Nodule # 1:   Location: Right; Superior   Maximum size: 1.4 cm; Other 2 dimensions: 1.0 x 0.9 cm   Composition: spongiform (0)   Echogenicity: hypoechoic (2)   Shape: not taller-than-wide (0)   Margins: smooth (0)   Echogenic foci: none (0)   ACR TI-RADS total points: 2.   ACR TI-RADS risk category: TR2 (2 points).   ACR TI-RADS recommendations:  This nodule does NOT meet TI-RADS criteria for biopsy or dedicated follow-up.   _________________________________________________________   Nodule # 2:   Location: Right; Mid   Maximum size: 1.6 cm; Other 2 dimensions: 1.4 x 1.3 cm   Composition: mixed cystic and solid (1)   Echogenicity: isoechoic (1)   Shape: not taller-than-wide (0)   Margins: ill-defined (0)   Echogenic foci: none (0)   ACR TI-RADS total points: 2.   ACR TI-RADS risk category: TR2 (2 points).   ACR TI-RADS recommendations:   This nodule does NOT meet TI-RADS criteria for biopsy or dedicated follow-up.   _________________________________________________________   Nodule # 3:   Location: Right; Inferior   Maximum size: 2.2 cm; Other 2 dimensions: 1.8 x 1.6 cm   Composition: mixed cystic and solid (1)   Echogenicity: isoechoic (1)   Shape: not taller-than-wide (0)   Margins: ill-defined (0)   Echogenic foci: none (0)   ACR TI-RADS total points: 2.   ACR TI-RADS risk category: TR2 (2 points).   ACR TI-RADS recommendations:   This nodule does NOT meet TI-RADS criteria for biopsy or dedicated follow-up.   _________________________________________________________   Nodule # 4:   Location: Left; Mid   Maximum size: 2.5 cm; Other 2 dimensions: 1.6 x 1.4 cm   Composition: mixed cystic and solid (1)   Echogenicity: hypoechoic (2)   Shape: not taller-than-wide (0)   Margins: ill-defined (0)   Echogenic foci: none (0)   ACR  TI-RADS total points: 3.   ACR TI-RADS risk category: TR3 (3 points).   ACR TI-RADS recommendations:   **Given size (>/= 2.5 cm) and appearance, fine needle aspiration of this mildly suspicious nodule should be considered based on TI-RADS criteria.   _________________________________________________________   Nodule # 5:   Location: Left; Inferior   Maximum size: 0.8 cm; Other 2 dimensions: 0.8 x 0.4 cm   Composition: spongiform (0)   Echogenicity: hypoechoic (2)   Shape: not taller-than-wide (0)   Margins: smooth (0)   Echogenic foci: none (0)   ACR TI-RADS total points: 2.   ACR TI-RADS risk category: TR2 (2 points).   ACR TI-RADS recommendations:   This nodule does NOT meet TI-RADS criteria for biopsy or dedicated follow-up.   _________________________________________________________   No cervical lymphadenopathy.   IMPRESSION: 1. Multinodular goiter. 2. Solid cystic nodule in the left mid thyroid  (labeled 4, 2.5 cm) meets criteria (TI-RADS category 3) for tissue sampling. Recommend ultrasound-guided fine-needle aspiration. 3. The remaining visualized bilateral thyroid  nodules appear benign and do not warrant additional follow-up.    FNA Left Mid 2.5 cm thyroid  nodule 10/22/2023   Clinical History: Left; Mid 2.5cm;  Other 2 dimensions: 1.6 x 1.4cm,  mixed cystic and solid hypoechoic TI-RADS - 3  Specimen Submitted:  A. THYROID , LEFT MID, FINE NEEDLE ASPIRATION    FINAL MICROSCOPIC DIAGNOSIS:  - Benign follicular nodule (Bethesda category II)    ASSESSMENT/PLAN/RECOMMENDATIONS:   Subclinical Hyperthyroidism  - Patient is clinically euthyroid - Discussed differential diagnosis to include amiodarone , Graves' disease, versus autonomous thyroid  nodule - I have recommended methimazole , we discussed the importance of euthyroidism in the setting of cardiac arrhythmia We discussed that Graves' Disease is a result of an autoimmune condition involving the  thyroid .    We discussed with pt the benefits of methimazole  in the Tx of hyperthyroidism, as well as the possible side effects/complications of anti-thyroid  drug Tx (specifically detailing the rare, but serious side effect of agranulocytosis). He was informed of need for  regular thyroid  function monitoring while on methimazole  to ensure appropriate dosage without over-treatment. As well, we discussed the possible side effects of methimazole  including the chance of rash, the small chance of liver irritation/juandice and the <=1 in 300-400 chance of sudden onset agranulocytosis.  We discussed importance of going to ED promptly (and stopping methimazole ) if he were to develop significant fever with severe sore throat of other evidence of acute infection.     - Will recheck labs in 6 weeks   Medications : Methimazole  5 mg Monday through Friday, none on Saturdays on Sundays    2. Multinodular Goiter :  - No local neck symptoms  - S/P benign FNA of the left mid thyroid  nodule in 10/2023 - Discussed importance of annual ultrasound for up to 5 years - We also discussed this could be another cause for hyperthyroidism - Unable to proceed with uptake and scan as he has been off Amiodarone     Follow-up in 3 months  Signed electronically by: Stefano Redgie Butts, MD  [facility] Endocrinology  [facility] [address] [place] [place], [place] [address] Phone: [phone] FAX: [phone]   CC: Theophilus Andrews, Tully GRADE, MD [address] [place] [address] Phone: [phone] Fax: [phone]   Return to Endocrinology clinic as below: Future Appointments  Date Time Provider Department Center  02/11/2024 11:00 AM [facility] PAT 2 [facility] None  02/15/2024  9:15 AM Becki Krabbe, FNP [facility] None

## 2024-01-19 ENCOUNTER — Other Ambulatory Visit: Payer: Self-pay | Admitting: Internal Medicine

## 2024-01-19 ENCOUNTER — Ambulatory Visit: Payer: Self-pay | Admitting: Internal Medicine

## 2024-01-19 DIAGNOSIS — E042 Nontoxic multinodular goiter: Secondary | ICD-10-CM | POA: Insufficient documentation

## 2024-01-19 DIAGNOSIS — E0590 Thyrotoxicosis, unspecified without thyrotoxic crisis or storm: Secondary | ICD-10-CM | POA: Insufficient documentation

## 2024-01-19 MED ORDER — METHIMAZOLE 5 MG PO TABS: 5.0000 mg | ORAL_TABLET | ORAL | 2 refills | Status: DC

## 2024-01-19 MED ORDER — LISINOPRIL 40 MG PO TABS: 40.0000 mg | ORAL_TABLET | Freq: Every day | ORAL | 1 refills | Status: DC

## 2024-01-19 MED ORDER — FENOFIBRATE 145 MG PO TABS: 145.0000 mg | ORAL_TABLET | Freq: Every day | ORAL | 1 refills | Status: DC

## 2024-01-19 NOTE — Telephone Encounter (Signed)
 Please let the pt know that his thyroid  test continues to show over activity    My suggest is to start Methimazole  Monday through Friday and none on Saturdays or Sundays    Thank you

## 2024-01-19 NOTE — Telephone Encounter (Signed)
 Copied from CRM [id number]. Topic: Clinical - Medication Refill >> Jan 19, 2024 10:36 AM Franky GRADE wrote: Medication: lisinopril  (ZESTRIL ) 40 MG tablet [[id number]], fenofibrate  (TRICOR ) 145 MG tablet [[id number]]  Has the patient contacted their pharmacy? Yes, they asked patient to contact his ordering provider.  (Agent: If no, request that the patient contact the pharmacy for the refill. If patient does not wish to contact the pharmacy document the reason why and proceed with request.) (Agent: If yes, when and what did the pharmacy advise?)  This is the patient's preferred pharmacy:  [id number] - [place], [place] - [address] [place] [address] Phone: [phone] Fax: [phone]  Is this the correct pharmacy for this prescription? Yes If no, delete pharmacy and type the correct one.   Has the prescription been filled recently? No  Is the patient out of the medication? No  Has the patient been seen for an appointment in the last year OR does the patient have an upcoming appointment? Yes  Can we respond through MyChart? Yes  Agent: Please be advised that Rx refills may take up to 3 business days. We ask that you follow-up with your pharmacy.

## 2024-01-20 ENCOUNTER — Ambulatory Visit: Admitting: Family Medicine

## 2024-01-21 ENCOUNTER — Ambulatory Visit: Payer: Self-pay | Admitting: Student

## 2024-01-21 DIAGNOSIS — E1169 Type 2 diabetes mellitus with other specified complication: Secondary | ICD-10-CM

## 2024-01-21 LAB — T3, FREE: T3, Free: 3.5 pg/mL (ref 2.3–4.2)

## 2024-01-21 LAB — THYROID STIMULATING IMMUNOGLOBULIN: TSI: 94 %{baseline} (ref <140)

## 2024-01-21 LAB — TRAB (TSH RECEPTOR BINDING ANTIBODY): TRAB: <1 IU/L (ref <=2.00)

## 2024-01-21 LAB — T4, FREE: Free T4: 1.3 ng/dL (ref 0.8–1.8)

## 2024-01-21 LAB — TSH: TSH: 0.26 m[IU]/L — ABNORMAL LOW (ref 0.40–4.50)

## 2024-01-21 NOTE — H&P (View-Only) (Signed)
 TOTAL KNEE ADMISSION H&P  Patient is being admitted for right total knee arthroplasty.  Subjective:  Chief Complaint:right knee pain.  HPI: KARION CUDD, 63 y.o. male, has a history of pain and functional disability in the right knee due to arthritis and has failed non-surgical conservative treatments for greater than 12 weeks to includeNSAID's and/or analgesics, corticosteriod injections, flexibility and strengthening excercises, use of assistive devices, weight reduction as appropriate, and activity modification.  Onset of symptoms was gradual, starting 10 years ago with rapidlly worsening course since that time. The patient noted no past surgery on the right knee(s).  Patient currently rates pain in the right knee(s) at 10 out of 10 with activity. Patient has night pain, worsening of pain with activity and weight bearing, pain that interferes with activities of daily living, pain with passive range of motion, crepitus, and joint swelling.  Patient has evidence of subchondral cysts, subchondral sclerosis, periarticular osteophytes, and joint space narrowing by imaging studies. There is no active infection.  Patient Active Problem List   Diagnosis Date Noted   Hyperthyroidism 01/19/2024   Multinodular goiter 01/19/2024   Low serum HDL 02/03/2022   Vitamin D  insufficiency 01/28/2022   Type 2 diabetes mellitus with obesity (HCC) 01/27/2022   Low HDL (under 40) 01/15/2022   Other fatigue 01/14/2022   SOB (shortness of breath) on exertion 01/14/2022   Vitamin D  deficiency 01/14/2022   Depression screening 01/14/2022   Class 3 severe obesity with serious comorbidity and body mass index (BMI) of 45.0 to 49.9 in adult 01/14/2022   Ascending aorta dilatation (HCC) 12/19/2020   Arthritis of both knees 10/30/2019   Nonischemic cardiomyopathy (HCC) 10/13/2019   Atrial flutter (HCC) 06/12/2019   Acquired thrombophilia (HCC) 06/12/2019   Bradycardia 10/21/2015   Anticoagulant long-term use  09/09/2015   GERD (gastroesophageal reflux disease) 09/09/2015   Varicose veins of leg with complications 03/11/2015   Varicose veins of lower extremities with complications 11/20/2014   Persistent atrial fibrillation (HCC)    HTN (hypertension)    Warfarin anticoagulation    Dyslipidemia    Severe obstructive sleep apnea    Morbid obesity (HCC)    Past Medical History:  Diagnosis Date   Arthritis    Ascending aorta dilatation (HCC)    41mm by CT>>normal when indexed for BSA   Back pain    Bradycardia 10/21/2015   Diabetes mellitus without complication (HCC)    Dyslipidemia    Heart burn    High cholesterol    HTN (hypertension)    Joint pain    NICM (nonischemic cardiomyopathy) (HCC)    a. LHC 2002: no CAD, EF 35-40%; b. Echo 2003: EF 60% - tachycardia mediated (AFlutter with RVR).  EF 50% on echo 11/2020   Obesity    Palpitation    Persistent atrial fibrillation (HCC)    CHADS2VASC score is 2.  s/p remote afib and aflutter ablations   Severe obstructive sleep apnea    , CPAP at 14cm H2O   Swelling of lower extremity    Varicose veins     Past Surgical History:  Procedure Laterality Date   afib ablation      BIOPSY THYROID      10/2023   CARDIAC SURGERY     CARDIOVERSION N/A 09/07/2019   Procedure: CARDIOVERSION;  Surgeon: Loni Soyla LABOR, MD;  Location: [facility] ENDOSCOPY;  Service: Cardiovascular;  Laterality: N/A;   COLONOSCOPY     right knee surgery      VARICOSE VEIN SURGERY  Current Outpatient Medications  Medication Sig Dispense Refill Last Dose/Taking   acetaminophen  (TYLENOL ) 500 MG tablet Take 1,000 mg by mouth every 6 (six) hours as needed for moderate pain.      digoxin  (LANOXIN ) 0.125 MG tablet Take 1 tablet (0.125 mg total) by mouth daily. 90 tablet 3    diltiazem  (CARDIZEM  CD) 360 MG 24 hr capsule TAKE 1 CAPSULE BY MOUTH EVERY DAY 90 capsule 3    fenofibrate  (TRICOR ) 145 MG tablet Take 1 tablet (145 mg total) by mouth daily. 90 tablet 1     finasteride  (PROSCAR ) 5 MG tablet Take 5 mg by mouth daily.      furosemide  (LASIX ) 20 MG tablet TAKE 1 TABLET AS NEEDED FOR EXCESS SWELLING/WEIGHT GAIN 2-3LBS PER DAY OR 5LBS PER WEEK 90 tablet 3    lisinopril  (ZESTRIL ) 40 MG tablet Take 1 tablet (40 mg total) by mouth daily. 90 tablet 1    methimazole  (TAPAZOLE ) 5 MG tablet Take 1 tablet (5 mg total) by mouth as directed. 1 tablet Monday through Friday and none Saturdays or sundays 65 tablet 2    metoprolol  tartrate (LOPRESSOR ) 100 MG tablet TAKE 1 TABLET BY MOUTH EVERY MORNING AND 1&1/2 TABLETS EVERY EVENING (Patient taking differently: Take 100-150 mg by mouth See admin instructions. Take 100 mg in the morning and 150 mg at bedtime) 225 tablet 3    omeprazole  (PRILOSEC) 40 MG capsule Take 1 capsule (40 mg total) by mouth daily. 90 capsule 1    tamsulosin  (FLOMAX ) 0.4 MG CAPS capsule Take 0.4 mg by mouth at bedtime.      tirzepatide  (MOUNJARO ) 7.5 MG/0.5ML Pen Inject 7.5 mg into the skin once a week. 2 mL 0    Vitamin D , Ergocalciferol , (DRISDOL ) 1.25 MG (50000 UNIT) CAPS capsule Take 1 capsule (50,000 Units total) by mouth every 7 (seven) days. 5 capsule 0    XARELTO  20 MG TABS tablet TAKE ONE TABLET BY MOUTH ONE TIME DAILY WITH SUPPER 90 tablet 1    No current facility-administered medications for this visit.   Allergies  Allergen Reactions   Plavix [Clopidogrel Bisulfate] Rash    Social History   Tobacco Use   Smoking status: Never   Smokeless tobacco: Never  Substance Use Topics   Alcohol  use: Yes    Alcohol /week: 3.0 standard drinks of alcohol     Types: 1 Standard drinks or equivalent, 1 Cans of beer, 1 Glasses of wine per week    Comment: occassional beer and wine    Family History  Problem Relation Age of Onset   Hypertension Mother    Diabetes Mother    High blood pressure Mother    High Cholesterol Mother    Heart disease Mother    Sleep apnea Mother    Hypertension Father    Heart disease Father    High Cholesterol  Father      Review of Systems  Cardiovascular:  Positive for leg swelling.  Musculoskeletal:  Positive for arthralgias, gait problem and joint swelling.  All other systems reviewed and are negative.   Objective:  Physical Exam Constitutional:      Appearance: Normal appearance.  HENT:     Head: Normocephalic and atraumatic.     Nose: Nose normal.     Mouth/Throat:     Mouth: Mucous membranes are moist.     Pharynx: Oropharynx is clear.  Eyes:     Conjunctiva/sclera: Conjunctivae normal.  Cardiovascular:     Rate and Rhythm: Normal rate and  regular rhythm.     Pulses: Normal pulses.     Heart sounds: Normal heart sounds.  Pulmonary:     Effort: Pulmonary effort is normal.     Breath sounds: Normal breath sounds.  Abdominal:     General: Abdomen is flat.     Palpations: Abdomen is soft.  Genitourinary:    Comments: deferred Musculoskeletal:     Cervical back: Normal range of motion and neck supple.     Comments: Examination of the right knee reveals no skin wounds or lesions. He has significant varicose veins about the knee. Varus deformity. He has swelling, trace effusion. No warmth or erythema. Tenderness to palpation medial joint line, lateral joint line, and peripatellar retinacular tissues with a positive grind sign. Range of motion 10 to 108 degrees without any ligamentous instability. Painless range of motion of the hip.   Distally, there is no focal motor or sensory deficit. He has palpable pedal pulses. Edema is significantly improved. Venous stasis skin changes. Large varicose veins noted.   Ambulates with an antalgic gait.  Skin:    General: Skin is warm and dry.     Capillary Refill: Capillary refill takes less than 2 seconds.  Neurological:     General: No focal deficit present.     Mental Status: He is alert and oriented to person, place, and time.  Psychiatric:        Mood and Affect: Mood normal.        Behavior: Behavior normal.        Thought Content:  Thought content normal.        Judgment: Judgment normal.     Vital signs in last 24 hours: @VSRANGES @  Labs:   Estimated body mass index is 38.65 kg/m as calculated from the following:   Height as of 02/15/24: 6' (1.829 m).   Weight as of 02/15/24: 129.3 kg.   Imaging Review Plain radiographs demonstrate severe degenerative joint disease of the right knee(s). The overall alignment issignificant varus. The bone quality appears to be adequate for age and reported activity level.      Assessment/Plan:  End stage arthritis, right knee   The patient history, physical examination, clinical judgment of the provider and imaging studies are consistent with end stage degenerative joint disease of the right knee(s) and total knee arthroplasty is deemed medically necessary. The treatment options including medical management, injection therapy arthroscopy and arthroplasty were discussed at length. The risks and benefits of total knee arthroplasty were presented and reviewed. The risks due to aseptic loosening, infection, stiffness, patella tracking problems, thromboembolic complications and other imponderables were discussed. The patient acknowledged the explanation, agreed to proceed with the plan and consent was signed. Patient is being admitted for inpatient treatment for surgery, pain control, PT, OT, prophylactic antibiotics, VTE prophylaxis, progressive ambulation and ADL's and discharge planning. The patient is planning to be discharged home with OPPT once cleared with PT.  Therapy Plans: outpatient therapy. [facility] 02/22/24 Disposition: Home with wife.  Planned DVT Prophylaxis: Xarelto  20mg  daily DME needed: walker. Will decide about ice machine.  PCP: Cleared.  Cardiology: Cleared. Hold xarelto  4 days prior to surgery. Restart when medically optimized.  TXA: IV Allergies: - Plavix - rash  Anesthesia Concerns: None.  BMI: 36.9 Last HgbA1c: 6.7. Other:  - Mixed  CHF - A-fib, chronic xarelto  20mg  daily. Discussed to hold 4 days prior to surgery.  - Severe OSA, uses CPAP.  - Chronic venous stasis with edema, compression stockings  20-9mmHg. Please bring to hospital. - T2DM, semaglutide , hold 14 days prior to surgery.  - Oxycodone , zofran , methocarbamol , no NSAIDs. - 02/11/24: Hgb 13.0, K+ 4.0, Cr. 0.88. - Mupirocin    Patient's anticipated LOS is less than 2 midnights, meeting these requirements: - Younger than 7 - Lives within 1 hour of care - Has a competent adult at home to recover with post-op recover - NO history of  - Chronic pain requiring opiods  - Diabetes  - Coronary Artery Disease  - Heart failure  - Heart attack  - Stroke  - DVT/VTE  - Cardiac arrhythmia  - Respiratory Failure/COPD  - Renal failure  - Anemia  - Advanced Liver disease

## 2024-01-21 NOTE — H&P (Incomplete Revision)
 TOTAL KNEE ADMISSION H&P  Patient is being admitted for right total knee arthroplasty.  Subjective:  Chief Complaint:right knee pain.  HPI: Geoffrey Robinson, 63 y.o. male, has a history of pain and functional disability in the right knee due to arthritis and has failed non-surgical conservative treatments for greater than 12 weeks to includeNSAID's and/or analgesics, corticosteriod injections, flexibility and strengthening excercises, use of assistive devices, weight reduction as appropriate, and activity modification.  Onset of symptoms was gradual, starting 10 years ago with rapidlly worsening course since that time. The patient noted no past surgery on the right knee(s).  Patient currently rates pain in the right knee(s) at 10 out of 10 with activity. Patient has night pain, worsening of pain with activity and weight bearing, pain that interferes with activities of daily living, pain with passive range of motion, crepitus, and joint swelling.  Patient has evidence of subchondral cysts, subchondral sclerosis, periarticular osteophytes, and joint space narrowing by imaging studies. There is no active infection.  Patient Active Problem List   Diagnosis Date Noted   Hyperthyroidism 01/19/2024   Multinodular goiter 01/19/2024   Low serum HDL 02/03/2022   Vitamin D  insufficiency 01/28/2022   Type 2 diabetes mellitus with obesity (HCC) 01/27/2022   Low HDL (under 40) 01/15/2022   Other fatigue 01/14/2022   SOB (shortness of breath) on exertion 01/14/2022   Vitamin D  deficiency 01/14/2022   Depression screening 01/14/2022   Class 3 severe obesity with serious comorbidity and body mass index (BMI) of 45.0 to 49.9 in adult 01/14/2022   Ascending aorta dilatation (HCC) 12/19/2020   Arthritis of both knees 10/30/2019   Nonischemic cardiomyopathy (HCC) 10/13/2019   Atrial flutter (HCC) 06/12/2019   Acquired thrombophilia (HCC) 06/12/2019   Bradycardia 10/21/2015   Anticoagulant long-term use  09/09/2015   GERD (gastroesophageal reflux disease) 09/09/2015   Varicose veins of leg with complications 03/11/2015   Varicose veins of lower extremities with complications 11/20/2014   Persistent atrial fibrillation (HCC)    HTN (hypertension)    Warfarin anticoagulation    Dyslipidemia    Severe obstructive sleep apnea    Morbid obesity (HCC)    Past Medical History:  Diagnosis Date   Ascending aorta dilatation (HCC)    41mm by CT>>normal when indexed for BSA   Back pain    Bradycardia 10/21/2015   Dyslipidemia    Heart burn    High cholesterol    HTN (hypertension)    Joint pain    NICM (nonischemic cardiomyopathy) (HCC)    a. LHC 2002: no CAD, EF 35-40%; b. Echo 2003: EF 60% - tachycardia mediated (AFlutter with RVR).  EF 50% on echo 11/2020   Obesity    Palpitation    Persistent atrial fibrillation (HCC)    CHADS2VASC score is 2.  s/p remote afib and aflutter ablations   Pre-diabetes    Severe obstructive sleep apnea    , CPAP at 14cm H2O   Swelling of lower extremity    Varicose veins     Past Surgical History:  Procedure Laterality Date   CARDIAC SURGERY     CARDIOVERSION N/A 09/07/2019   Procedure: CARDIOVERSION;  Surgeon: Loni Soyla LABOR, MD;  Location: [facility] ENDOSCOPY;  Service: Cardiovascular;  Laterality: N/A;   COLONOSCOPY      Current Outpatient Medications  Medication Sig Dispense Refill Last Dose/Taking   acetaminophen (TYLENOL) 500 MG tablet Take 1,000 mg by mouth every 6 (six) hours as needed for moderate pain.  digoxin  (LANOXIN ) 0.125 MG tablet Take 1 tablet (0.125 mg total) by mouth daily. 90 tablet 3    diltiazem  (CARDIZEM  CD) 360 MG 24 hr capsule TAKE 1 CAPSULE BY MOUTH EVERY DAY 90 capsule 3    fenofibrate  (TRICOR ) 145 MG tablet Take 1 tablet (145 mg total) by mouth daily. 90 tablet 1    finasteride (PROSCAR) 5 MG tablet Take 5 mg by mouth daily.      furosemide  (LASIX ) 20 MG tablet TAKE 1 TABLET AS NEEDED FOR EXCESS SWELLING/WEIGHT GAIN  2-3LBS PER DAY OR 5LBS PER WEEK 90 tablet 3    lisinopril  (ZESTRIL ) 40 MG tablet Take 1 tablet (40 mg total) by mouth daily. 90 tablet 1    methimazole  (TAPAZOLE ) 5 MG tablet Take 1 tablet (5 mg total) by mouth as directed. 1 tablet Monday through Friday and none Saturdays or sundays 65 tablet 2    metoprolol  tartrate (LOPRESSOR ) 100 MG tablet TAKE 1 TABLET BY MOUTH EVERY MORNING AND 1&1/2 TABLETS EVERY EVENING 225 tablet 3    omeprazole  (PRILOSEC) 40 MG capsule Take 1 capsule (40 mg total) by mouth daily. 90 capsule 1    tamsulosin (FLOMAX) 0.4 MG CAPS capsule Take 0.4 mg by mouth at bedtime.      tirzepatide  (MOUNJARO ) 7.5 MG/0.5ML Pen Inject 7.5 mg into the skin once a week. 2 mL 0    Vitamin D , Ergocalciferol , (DRISDOL ) 1.25 MG (50000 UNIT) CAPS capsule Take 1 capsule (50,000 Units total) by mouth every 7 (seven) days. 5 capsule 0    XARELTO  20 MG TABS tablet TAKE ONE TABLET BY MOUTH ONE TIME DAILY WITH SUPPER 90 tablet 1    No current facility-administered medications for this visit.   Allergies  Allergen Reactions   Plavix [Clopidogrel Bisulfate] Rash    Social History   Tobacco Use   Smoking status: Never   Smokeless tobacco: Never  Substance Use Topics   Alcohol use: Yes    Alcohol/week: 3.0 standard drinks of alcohol    Types: 1 Standard drinks or equivalent, 1 Cans of beer, 1 Glasses of wine per week    Comment: occassional beer and wine    Family History  Problem Relation Age of Onset   Hypertension Mother    Diabetes Mother    High blood pressure Mother    High Cholesterol Mother    Heart disease Mother    Sleep apnea Mother    Hypertension Father    Heart disease Father    High Cholesterol Father      Review of Systems  Cardiovascular:  Positive for leg swelling.  Musculoskeletal:  Positive for arthralgias, gait problem and joint swelling.  All other systems reviewed and are negative.   Objective:  Physical Exam Constitutional:      Appearance: Normal  appearance.  HENT:     Head: Normocephalic and atraumatic.     Nose: Nose normal.     Mouth/Throat:     Mouth: Mucous membranes are moist.     Pharynx: Oropharynx is clear.  Eyes:     Conjunctiva/sclera: Conjunctivae normal.  Cardiovascular:     Rate and Rhythm: Normal rate and regular rhythm.     Pulses: Normal pulses.     Heart sounds: Normal heart sounds.  Pulmonary:     Effort: Pulmonary effort is normal.     Breath sounds: Normal breath sounds.  Abdominal:     General: Abdomen is flat.     Palpations: Abdomen is soft.  Genitourinary:  Comments: deferred Musculoskeletal:     Cervical back: Normal range of motion and neck supple.     Comments: Examination of the right knee reveals no skin wounds or lesions. He has significant varicose veins about the knee. Varus deformity. He has swelling, trace effusion. No warmth or erythema. Tenderness to palpation medial joint line, lateral joint line, and peripatellar retinacular tissues with a positive grind sign. Range of motion 10 to 108 degrees without any ligamentous instability. Painless range of motion of the hip.   Distally, there is no focal motor or sensory deficit. He has palpable pedal pulses. Edema is significantly improved. Venous stasis skin changes. Large varicose veins noted.   Ambulates with an antalgic gait.  Skin:    General: Skin is warm and dry.     Capillary Refill: Capillary refill takes less than 2 seconds.  Neurological:     General: No focal deficit present.     Mental Status: He is alert and oriented to person, place, and time.  Psychiatric:        Mood and Affect: Mood normal.        Behavior: Behavior normal.        Thought Content: Thought content normal.        Judgment: Judgment normal.     Vital signs in last 24 hours: @VSRANGES @  Labs:   Estimated body mass index is 39.74 kg/m as calculated from the following:   Height as of 01/18/24: 6' (1.829 m).   Weight as of 01/18/24: 132.9  kg.   Imaging Review Plain radiographs demonstrate severe degenerative joint disease of the right knee(s). The overall alignment issignificant varus. The bone quality appears to be adequate for age and reported activity level.      Assessment/Plan:  End stage arthritis, right knee   The patient history, physical examination, clinical judgment of the provider and imaging studies are consistent with end stage degenerative joint disease of the right knee(s) and total knee arthroplasty is deemed medically necessary. The treatment options including medical management, injection therapy arthroscopy and arthroplasty were discussed at length. The risks and benefits of total knee arthroplasty were presented and reviewed. The risks due to aseptic loosening, infection, stiffness, patella tracking problems, thromboembolic complications and other imponderables were discussed. The patient acknowledged the explanation, agreed to proceed with the plan and consent was signed. Patient is being admitted for inpatient treatment for surgery, pain control, PT, OT, prophylactic antibiotics, VTE prophylaxis, progressive ambulation and ADL's and discharge planning. The patient is planning to be discharged home with OPPT once cleared with PT.  Therapy Plans: outpatient therapy. [facility] 02/22/24 Disposition: Home with wife.  Planned DVT Prophylaxis: Xarelto  20mg  daily DME needed: walker. Will decide about ice machine.  PCP: Cleared.  Cardiology: Cleared. Hold xarelto  4 days prior to surgery. Restart when medically optimized.  TXA: IV Allergies: - Plavix - rash  Anesthesia Concerns: None.  BMI: 36.9 Last HgbA1c: 6.7. Other:  - Mixed CHF - A-fib, chronic xarelto  20mg  daily. Discussed to hold 4 days prior to surgery.  - Severe OSA, uses CPAP.  - Chronic venous stasis with edema, compression stockings 20-62mmHg. Please bring to hospital. - T2DM, semaglutide , hold 14 days prior to surgery.  -  Oxycodone, zofran, methocarbamol, no NSAIDs. - Labs pending.    Patient's anticipated LOS is less than 2 midnights, meeting these requirements: - Younger than 67 - Lives within 1 hour of care - Has a competent adult at home to recover with post-op recover -  NO history of  - Chronic pain requiring opiods  - Diabetes  - Coronary Artery Disease  - Heart failure  - Heart attack  - Stroke  - DVT/VTE  - Cardiac arrhythmia  - Respiratory Failure/COPD  - Renal failure  - Anemia  - Advanced Liver disease

## 2024-01-21 NOTE — H&P (Cosign Needed Addendum)
 TOTAL KNEE ADMISSION H&P  Patient is being admitted for right total knee arthroplasty.  Subjective:  Chief Complaint:right knee pain.  HPI: Geoffrey Robinson, 63 y.o. male, has a history of pain and functional disability in the right knee due to arthritis and has failed non-surgical conservative treatments for greater than 12 weeks to includeNSAID's and/or analgesics, corticosteriod injections, flexibility and strengthening excercises, use of assistive devices, weight reduction as appropriate, and activity modification.  Onset of symptoms was gradual, starting 10 years ago with rapidlly worsening course since that time. The patient noted no past surgery on the right knee(s).  Patient currently rates pain in the right knee(s) at 10 out of 10 with activity. Patient has night pain, worsening of pain with activity and weight bearing, pain that interferes with activities of daily living, pain with passive range of motion, crepitus, and joint swelling.  Patient has evidence of subchondral cysts, subchondral sclerosis, periarticular osteophytes, and joint space narrowing by imaging studies. There is no active infection.  Patient Active Problem List   Diagnosis Date Noted   Hyperthyroidism 01/19/2024   Multinodular goiter 01/19/2024   Low serum HDL 02/03/2022   Vitamin D  insufficiency 01/28/2022   Type 2 diabetes mellitus with obesity (HCC) 01/27/2022   Low HDL (under 40) 01/15/2022   Other fatigue 01/14/2022   SOB (shortness of breath) on exertion 01/14/2022   Vitamin D  deficiency 01/14/2022   Depression screening 01/14/2022   Class 3 severe obesity with serious comorbidity and body mass index (BMI) of 45.0 to 49.9 in adult 01/14/2022   Ascending aorta dilatation (HCC) 12/19/2020   Arthritis of both knees 10/30/2019   Nonischemic cardiomyopathy (HCC) 10/13/2019   Atrial flutter (HCC) 06/12/2019   Acquired thrombophilia (HCC) 06/12/2019   Bradycardia 10/21/2015   Anticoagulant long-term use  09/09/2015   GERD (gastroesophageal reflux disease) 09/09/2015   Varicose veins of leg with complications 03/11/2015   Varicose veins of lower extremities with complications 11/20/2014   Persistent atrial fibrillation (HCC)    HTN (hypertension)    Warfarin anticoagulation    Dyslipidemia    Severe obstructive sleep apnea    Morbid obesity (HCC)    Past Medical History:  Diagnosis Date   Arthritis    Ascending aorta dilatation (HCC)    41mm by CT>>normal when indexed for BSA   Back pain    Bradycardia 10/21/2015   Diabetes mellitus without complication (HCC)    Dyslipidemia    Heart burn    High cholesterol    HTN (hypertension)    Joint pain    NICM (nonischemic cardiomyopathy) (HCC)    a. LHC 2002: no CAD, EF 35-40%; b. Echo 2003: EF 60% - tachycardia mediated (AFlutter with RVR).  EF 50% on echo 11/2020   Obesity    Palpitation    Persistent atrial fibrillation (HCC)    CHADS2VASC score is 2.  s/p remote afib and aflutter ablations   Severe obstructive sleep apnea    , CPAP at 14cm H2O   Swelling of lower extremity    Varicose veins     Past Surgical History:  Procedure Laterality Date   afib ablation      BIOPSY THYROID      10/2023   CARDIAC SURGERY     CARDIOVERSION N/A 09/07/2019   Procedure: CARDIOVERSION;  Surgeon: Loni Soyla LABOR, MD;  Location: [facility] ENDOSCOPY;  Service: Cardiovascular;  Laterality: N/A;   COLONOSCOPY     right knee surgery      VARICOSE VEIN SURGERY  Current Outpatient Medications  Medication Sig Dispense Refill Last Dose/Taking   acetaminophen  (TYLENOL ) 500 MG tablet Take 1,000 mg by mouth every 6 (six) hours as needed for moderate pain.      digoxin  (LANOXIN ) 0.125 MG tablet Take 1 tablet (0.125 mg total) by mouth daily. 90 tablet 3    diltiazem  (CARDIZEM  CD) 360 MG 24 hr capsule TAKE 1 CAPSULE BY MOUTH EVERY DAY 90 capsule 3    fenofibrate  (TRICOR ) 145 MG tablet Take 1 tablet (145 mg total) by mouth daily. 90 tablet 1     finasteride  (PROSCAR ) 5 MG tablet Take 5 mg by mouth daily.      furosemide  (LASIX ) 20 MG tablet TAKE 1 TABLET AS NEEDED FOR EXCESS SWELLING/WEIGHT GAIN 2-3LBS PER DAY OR 5LBS PER WEEK 90 tablet 3    lisinopril  (ZESTRIL ) 40 MG tablet Take 1 tablet (40 mg total) by mouth daily. 90 tablet 1    methimazole  (TAPAZOLE ) 5 MG tablet Take 1 tablet (5 mg total) by mouth as directed. 1 tablet Monday through Friday and none Saturdays or sundays 65 tablet 2    metoprolol  tartrate (LOPRESSOR ) 100 MG tablet TAKE 1 TABLET BY MOUTH EVERY MORNING AND 1&1/2 TABLETS EVERY EVENING (Patient taking differently: Take 100-150 mg by mouth See admin instructions. Take 100 mg in the morning and 150 mg at bedtime) 225 tablet 3    omeprazole  (PRILOSEC) 40 MG capsule Take 1 capsule (40 mg total) by mouth daily. 90 capsule 1    tamsulosin  (FLOMAX ) 0.4 MG CAPS capsule Take 0.4 mg by mouth at bedtime.      tirzepatide  (MOUNJARO ) 7.5 MG/0.5ML Pen Inject 7.5 mg into the skin once a week. 2 mL 0    Vitamin D , Ergocalciferol , (DRISDOL ) 1.25 MG (50000 UNIT) CAPS capsule Take 1 capsule (50,000 Units total) by mouth every 7 (seven) days. 5 capsule 0    XARELTO  20 MG TABS tablet TAKE ONE TABLET BY MOUTH ONE TIME DAILY WITH SUPPER 90 tablet 1    No current facility-administered medications for this visit.   Allergies  Allergen Reactions   Plavix [Clopidogrel Bisulfate] Rash    Social History   Tobacco Use   Smoking status: Never   Smokeless tobacco: Never  Substance Use Topics   Alcohol  use: Yes    Alcohol /week: 3.0 standard drinks of alcohol     Types: 1 Standard drinks or equivalent, 1 Cans of beer, 1 Glasses of wine per week    Comment: occassional beer and wine    Family History  Problem Relation Age of Onset   Hypertension Mother    Diabetes Mother    High blood pressure Mother    High Cholesterol Mother    Heart disease Mother    Sleep apnea Mother    Hypertension Father    Heart disease Father    High Cholesterol  Father      Review of Systems  Cardiovascular:  Positive for leg swelling.  Musculoskeletal:  Positive for arthralgias, gait problem and joint swelling.  All other systems reviewed and are negative.   Objective:  Physical Exam Constitutional:      Appearance: Normal appearance.  HENT:     Head: Normocephalic and atraumatic.     Nose: Nose normal.     Mouth/Throat:     Mouth: Mucous membranes are moist.     Pharynx: Oropharynx is clear.  Eyes:     Conjunctiva/sclera: Conjunctivae normal.  Cardiovascular:     Rate and Rhythm: Normal rate and  regular rhythm.     Pulses: Normal pulses.     Heart sounds: Normal heart sounds.  Pulmonary:     Effort: Pulmonary effort is normal.     Breath sounds: Normal breath sounds.  Abdominal:     General: Abdomen is flat.     Palpations: Abdomen is soft.  Genitourinary:    Comments: deferred Musculoskeletal:     Cervical back: Normal range of motion and neck supple.     Comments: Examination of the right knee reveals no skin wounds or lesions. He has significant varicose veins about the knee. Varus deformity. He has swelling, trace effusion. No warmth or erythema. Tenderness to palpation medial joint line, lateral joint line, and peripatellar retinacular tissues with a positive grind sign. Range of motion 10 to 108 degrees without any ligamentous instability. Painless range of motion of the hip.   Distally, there is no focal motor or sensory deficit. He has palpable pedal pulses. Edema is significantly improved. Venous stasis skin changes. Large varicose veins noted.   Ambulates with an antalgic gait.  Skin:    General: Skin is warm and dry.     Capillary Refill: Capillary refill takes less than 2 seconds.  Neurological:     General: No focal deficit present.     Mental Status: He is alert and oriented to person, place, and time.  Psychiatric:        Mood and Affect: Mood normal.        Behavior: Behavior normal.        Thought Content:  Thought content normal.        Judgment: Judgment normal.     Vital signs in last 24 hours: @VSRANGES @  Labs:   Estimated body mass index is 38.65 kg/m as calculated from the following:   Height as of 02/15/24: 6' (1.829 m).   Weight as of 02/15/24: 129.3 kg.   Imaging Review Plain radiographs demonstrate severe degenerative joint disease of the right knee(s). The overall alignment issignificant varus. The bone quality appears to be adequate for age and reported activity level.      Assessment/Plan:  End stage arthritis, right knee   The patient history, physical examination, clinical judgment of the provider and imaging studies are consistent with end stage degenerative joint disease of the right knee(s) and total knee arthroplasty is deemed medically necessary. The treatment options including medical management, injection therapy arthroscopy and arthroplasty were discussed at length. The risks and benefits of total knee arthroplasty were presented and reviewed. The risks due to aseptic loosening, infection, stiffness, patella tracking problems, thromboembolic complications and other imponderables were discussed. The patient acknowledged the explanation, agreed to proceed with the plan and consent was signed. Patient is being admitted for inpatient treatment for surgery, pain control, PT, OT, prophylactic antibiotics, VTE prophylaxis, progressive ambulation and ADL's and discharge planning. The patient is planning to be discharged home with OPPT once cleared with PT.  Therapy Plans: outpatient therapy. [facility] 02/22/24 Disposition: Home with wife.  Planned DVT Prophylaxis: Xarelto  20mg  daily DME needed: walker. Will decide about ice machine.  PCP: Cleared.  Cardiology: Cleared. Hold xarelto  4 days prior to surgery. Restart when medically optimized.  TXA: IV Allergies: - Plavix - rash  Anesthesia Concerns: None.  BMI: 36.9 Last HgbA1c: 6.7. Other:  - Mixed  CHF - A-fib, chronic xarelto  20mg  daily. Discussed to hold 4 days prior to surgery.  - Severe OSA, uses CPAP.  - Chronic venous stasis with edema, compression stockings  20-9mmHg. Please bring to hospital. - T2DM, semaglutide , hold 14 days prior to surgery.  - Oxycodone , zofran , methocarbamol , no NSAIDs. - 02/11/24: Hgb 13.0, K+ 4.0, Cr. 0.88. - Mupirocin    Patient's anticipated LOS is less than 2 midnights, meeting these requirements: - Younger than 7 - Lives within 1 hour of care - Has a competent adult at home to recover with post-op recover - NO history of  - Chronic pain requiring opiods  - Diabetes  - Coronary Artery Disease  - Heart failure  - Heart attack  - Stroke  - DVT/VTE  - Cardiac arrhythmia  - Respiratory Failure/COPD  - Renal failure  - Anemia  - Advanced Liver disease

## 2024-02-08 NOTE — Patient Instructions (Addendum)
 SURGICAL WAITING ROOM VISITATION  Patients having surgery or a procedure may have no more than 2 support people in the waiting area - these visitors may rotate.    Children under the age of 29 must have an adult with them who is not the patient.  Visitors with respiratory illnesses are discouraged from visiting and should remain at home.  If the patient needs to stay at the hospital during part of their recovery, the visitor guidelines for inpatient rooms apply. Pre-op nurse will coordinate an appropriate time for 1 support person to accompany patient in pre-op.  This support person may not rotate.    Please refer to the [facility] website for the visitor guidelines for Inpatients (after your surgery is over and you are in a regular room).       Your procedure is scheduled on: 02/17/24     Report to [facility] Main Entrance    Report to admitting at   [phone]   Call this number if you have problems the morning of surgery [phone]   Do not eat food :After Midnight.   After Midnight you may have the following liquids until __ 0745___ AM  DAY OF SURGERY  Water Non-Citrus Juices (without pulp, NO RED-Apple, White grape, White cranberry) Black Coffee (NO MILK/CREAM OR CREAMERS, sugar ok)  Clear Tea (NO MILK/CREAM OR CREAMERS, sugar ok) regular and decaf                             Plain Jell-O (NO RED)                                           Fruit ices (not with fruit pulp, NO RED)                                     Popsicles (NO RED)                                                               Sports drinks like Gatorade (NO RED)                      The day of surgery:  Drink ONE (1) Pre-Surgery Clear Ensure or G2 at  0745AM the morning of surgery. Drink in one sitting. Do not sip.  This drink was given to you during your hospital  pre-op appointment visit. Nothing else to drink after completing the  Pre-Surgery Clear Ensure or G2.          If you have  questions, please contact your surgeon's office.       Oral Hygiene is also important to reduce your risk of infection.                                    Remember - BRUSH YOUR TEETH THE MORNING OF SURGERY WITH YOUR REGULAR TOOTHPASTE  DENTURES WILL BE REMOVED PRIOR TO SURGERY PLEASE DO NOT APPLY Poly  grip OR ADHESIVES!!!   Do NOT smoke after Midnight   Stop all vitamins and herbal supplements 7 days before surgery.   Take these medicines the morning of surgery with A SIP OF WATER:   digoxin , diltiazem , proscar, tapazole , metoprolol , omeprazole              Mounjaro - last dose on   DO NOT TAKE ANY ORAL DIABETIC MEDICATIONS DAY OF YOUR SURGERY  Bring CPAP mask and tubing day of surgery.                              You may not have any metal on your body including hair pins, jewelry, and body piercing             Do not wear make-up, lotions, powders, perfumes/cologne, or deodorant  Do not wear nail polish including gel and S&S, artificial/acrylic nails, or any other type of covering on natural nails including finger and toenails. If you have artificial nails, gel coating, etc. that needs to be removed by a nail salon please have this removed prior to surgery or surgery may need to be canceled/ delayed if the surgeon/ anesthesia feels like they are unable to be safely monitored.   Do not shave  48 hours prior to surgery.               Men may shave face and neck.   Do not bring valuables to the hospital. [facility] IS NOT             RESPONSIBLE   FOR VALUABLES.   Contacts, glasses, dentures or bridgework may not be worn into surgery.   Bring small overnight bag day of surgery.   DO NOT BRING YOUR HOME MEDICATIONS TO THE HOSPITAL. PHARMACY WILL DISPENSE MEDICATIONS LISTED ON YOUR MEDICATION LIST TO YOU DURING YOUR ADMISSION IN THE HOSPITAL!    Patients discharged on the day of surgery will not be allowed to drive home.  Someone NEEDS to stay with you for the first 24 hours  after anesthesia.   Special Instructions: Bring a copy of your healthcare power of attorney and living will documents the day of surgery if you haven't scanned them before.              Please read over the following fact sheets you were given: IF YOU HAVE QUESTIONS ABOUT YOUR PRE-OP INSTRUCTIONS PLEASE CALL [phone].   If you received a COVID test during your pre-op visit  it is requested that you wear a mask when out in public, stay away from anyone that may not be feeling well and notify your surgeon if you develop symptoms. If you test positive for Covid or have been in contact with anyone that has tested positive in the last 10 days please notify you surgeon.      Pre-operative 5 CHG Bath Instructions   You can play a key role in reducing the risk of infection after surgery. Your skin needs to be as free of germs as possible. You can reduce the number of germs on your skin by washing with CHG (chlorhexidine gluconate) soap before surgery. CHG is an antiseptic soap that kills germs and continues to kill germs even after washing.   DO NOT use if you have an allergy to chlorhexidine/CHG or antibacterial soaps. If your skin becomes reddened or irritated, stop using the CHG and notify one of our RNs at [phone].   Please  shower with the CHG soap starting 4 days before surgery using the following schedule:     Please keep in mind the following:  DO NOT shave, including legs and underarms, starting the day of your first shower.   You may shave your face at any point before/day of surgery.  Place clean sheets on your bed the day you start using CHG soap. Use a clean washcloth (not used since being washed) for each shower. DO NOT sleep with pets once you start using the CHG.   CHG Shower Instructions:  If you choose to wash your hair and private area, wash first with your normal shampoo/soap.  After you use shampoo/soap, rinse your hair and body thoroughly to remove shampoo/soap  residue.  Turn the water OFF and apply about 3 tablespoons (45 ml) of CHG soap to a CLEAN washcloth.  Apply CHG soap ONLY FROM YOUR NECK DOWN TO YOUR TOES (washing for 3-5 minutes)  DO NOT use CHG soap on face, private areas, open wounds, or sores.  Pay special attention to the area where your surgery is being performed.  If you are having back surgery, having someone wash your back for you may be helpful. Wait 2 minutes after CHG soap is applied, then you may rinse off the CHG soap.  Pat dry with a clean towel  Put on clean clothes/pajamas   If you choose to wear lotion, please use ONLY the CHG-compatible lotions on the back of this paper.     Additional instructions for the day of surgery: DO NOT APPLY any lotions, deodorants, cologne, or perfumes.   Put on clean/comfortable clothes.  Brush your teeth.  Ask your nurse before applying any prescription medications to the skin.      CHG Compatible Lotions   Aveeno Moisturizing lotion  Cetaphil Moisturizing Cream  Cetaphil Moisturizing Lotion  Clairol Herbal Essence Moisturizing Lotion, Dry Skin  Clairol Herbal Essence Moisturizing Lotion, Extra Dry Skin  Clairol Herbal Essence Moisturizing Lotion, Normal Skin  Curel Age Defying Therapeutic Moisturizing Lotion with Alpha Hydroxy  Curel Extreme Care Body Lotion  Curel Soothing Hands Moisturizing Hand Lotion  Curel Therapeutic Moisturizing Cream, Fragrance-Free  Curel Therapeutic Moisturizing Lotion, Fragrance-Free  Curel Therapeutic Moisturizing Lotion, Original Formula  Eucerin Daily Replenishing Lotion  Eucerin Dry Skin Therapy Plus Alpha Hydroxy Crme  Eucerin Dry Skin Therapy Plus Alpha Hydroxy Lotion  Eucerin Original Crme  Eucerin Original Lotion  Eucerin Plus Crme Eucerin Plus Lotion  Eucerin TriLipid Replenishing Lotion  Keri Anti-Bacterial Hand Lotion  Keri Deep Conditioning Original Lotion Dry Skin Formula Softly Scented  Keri Deep Conditioning Original Lotion,  Fragrance Free Sensitive Skin Formula  Keri Lotion Fast Absorbing Fragrance Free Sensitive Skin Formula  Keri Lotion Fast Absorbing Softly Scented Dry Skin Formula  Keri Original Lotion  Keri Skin Renewal Lotion Keri Silky Smooth Lotion  Keri Silky Smooth Sensitive Skin Lotion  Nivea Body Creamy Conditioning Oil  Nivea Body Extra Enriched Teacher, adult education Moisturizing Lotion Nivea Crme  Nivea Skin Firming Lotion  NutraDerm 30 Skin Lotion  NutraDerm Skin Lotion  NutraDerm Therapeutic Skin Cream  NutraDerm Therapeutic Skin Lotion  ProShield Protective Hand Cream  Provon moisturizing lotion

## 2024-02-08 NOTE — Progress Notes (Addendum)
 Anesthesia Review:  PCP: Theophilus Andrews LOV 12/29/23  Also Clearance from ? Dated 09/2023  Cardiologist : Danelle Birmingham- LOV 07/11/23 0 Clearance 11/25/23 on Media Tab on 02/14/24.  Traci Turner- LOV 05/11/23    Clearance for surgery on 09/2023 ? Unsure of MD  who signed.   PPM/ ICD: Device Orders: Rep Notified:  Chest x-ray : CT Angio chst- 08/01/23  EKG- 02/11/24 Monitor- 06/29/23  Echo :1.8.25  Stress test: 2010  Cardiac Cath :   Activity level: can do a flight of stairs without difficulty  Sleep Study/ CPAP : has cpap  Fasting Blood Sugar :      / Checks Blood Sugar -- times a day:     DM- type 2- checks glucose 1-2 times per week per pt  Hgba1c-02/11/24- 6.7  Mounjaro - last dose on 01/25/24 per tp and wife   Blood Thinner/ Instructions Cherre Dose: ASA / Instructions/ Last Dose :   Xarelto - last dose on 02/13/24   PT wears compression stockings on bilateral legs.    Hearing Aids

## 2024-02-10 ENCOUNTER — Other Ambulatory Visit: Payer: Self-pay | Admitting: Internal Medicine

## 2024-02-10 DIAGNOSIS — I4819 Other persistent atrial fibrillation: Secondary | ICD-10-CM

## 2024-02-10 NOTE — Telephone Encounter (Signed)
 Xarelto  20mg  refill request received. Pt is 63 years old, weight-132.9kg, Crea-0.88 on 10/06/23, last seen by Dr. Waddell on 07/07/23, Diagnosis-Afib, CrCl-167.8 mL/min; Dose is appropriate based on dosing criteria. Will send in refill to requested pharmacy.

## 2024-02-11 ENCOUNTER — Encounter (HOSPITAL_COMMUNITY)
Admission: RE | Admit: 2024-02-11 | Discharge: 2024-02-11 | Disposition: A | Discharge status: Home / Self Care | Source: Ambulatory Visit | Attending: Orthopedic Surgery | Admitting: Orthopedic Surgery

## 2024-02-11 ENCOUNTER — Other Ambulatory Visit: Payer: Self-pay

## 2024-02-11 ENCOUNTER — Encounter (HOSPITAL_COMMUNITY): Payer: Self-pay

## 2024-02-11 VITALS — BP 148/76 | HR 56 | Temp 98.3°F | Resp 16 | Ht 73.0 in | Wt 286.0 lb

## 2024-02-11 DIAGNOSIS — Z7985 Long-term (current) use of injectable non-insulin antidiabetic drugs: Secondary | ICD-10-CM | POA: Insufficient documentation

## 2024-02-11 DIAGNOSIS — R001 Bradycardia, unspecified: Secondary | ICD-10-CM | POA: Diagnosis not present

## 2024-02-11 DIAGNOSIS — Z01818 Encounter for other preprocedural examination: Secondary | ICD-10-CM | POA: Insufficient documentation

## 2024-02-11 DIAGNOSIS — E669 Obesity, unspecified: Secondary | ICD-10-CM | POA: Insufficient documentation

## 2024-02-11 DIAGNOSIS — I480 Paroxysmal atrial fibrillation: Secondary | ICD-10-CM | POA: Insufficient documentation

## 2024-02-11 DIAGNOSIS — I428 Other cardiomyopathies: Secondary | ICD-10-CM | POA: Diagnosis not present

## 2024-02-11 DIAGNOSIS — M1990 Unspecified osteoarthritis, unspecified site: Secondary | ICD-10-CM | POA: Diagnosis not present

## 2024-02-11 DIAGNOSIS — I10 Essential (primary) hypertension: Secondary | ICD-10-CM | POA: Insufficient documentation

## 2024-02-11 DIAGNOSIS — M1711 Unilateral primary osteoarthritis, right knee: Secondary | ICD-10-CM | POA: Diagnosis not present

## 2024-02-11 DIAGNOSIS — Z7901 Long term (current) use of anticoagulants: Secondary | ICD-10-CM | POA: Insufficient documentation

## 2024-02-11 DIAGNOSIS — E119 Type 2 diabetes mellitus without complications: Secondary | ICD-10-CM | POA: Diagnosis not present

## 2024-02-11 DIAGNOSIS — E1169 Type 2 diabetes mellitus with other specified complication: Secondary | ICD-10-CM | POA: Diagnosis not present

## 2024-02-11 DIAGNOSIS — E0500 Thyrotoxicosis with diffuse goiter without thyrotoxic crisis or storm: Secondary | ICD-10-CM | POA: Insufficient documentation

## 2024-02-11 DIAGNOSIS — G4733 Obstructive sleep apnea (adult) (pediatric): Secondary | ICD-10-CM | POA: Insufficient documentation

## 2024-02-11 DIAGNOSIS — Z79890 Hormone replacement therapy (postmenopausal): Secondary | ICD-10-CM | POA: Diagnosis not present

## 2024-02-11 DIAGNOSIS — K219 Gastro-esophageal reflux disease without esophagitis: Secondary | ICD-10-CM | POA: Diagnosis not present

## 2024-02-11 DIAGNOSIS — Z6837 Body mass index (BMI) 37.0-37.9, adult: Secondary | ICD-10-CM | POA: Insufficient documentation

## 2024-02-11 DIAGNOSIS — Z79899 Other long term (current) drug therapy: Secondary | ICD-10-CM | POA: Diagnosis not present

## 2024-02-11 DIAGNOSIS — I4510 Unspecified right bundle-branch block: Secondary | ICD-10-CM | POA: Diagnosis not present

## 2024-02-11 HISTORY — DX: Type 2 diabetes mellitus without complications: E11.9

## 2024-02-11 HISTORY — DX: Unspecified osteoarthritis, unspecified site: M19.90

## 2024-02-11 LAB — HEMOGLOBIN A1C
Hgb A1c MFr Bld: 6.7 % — ABNORMAL HIGH (ref 4.8–5.6)
Mean Plasma Glucose: 145.59 mg/dL

## 2024-02-11 LAB — CBC
HCT: 40.7 % (ref 39.0–52.0)
Hemoglobin: 13 g/dL (ref 13.0–17.0)
MCH: 28.4 pg (ref 26.0–34.0)
MCHC: 31.9 g/dL (ref 30.0–36.0)
MCV: 89.1 fL (ref 80.0–100.0)
Platelets: 205 K/uL (ref 150–400)
RBC: 4.57 MIL/uL (ref 4.22–5.81)
RDW: 14 % (ref 11.5–15.5)
WBC: 6.5 K/uL (ref 4.0–10.5)
nRBC: 0 % (ref 0.0–0.2)

## 2024-02-11 LAB — SURGICAL PCR SCREEN
MRSA, PCR: NEGATIVE
Staphylococcus aureus: POSITIVE — AB

## 2024-02-11 LAB — BASIC METABOLIC PANEL WITH GFR
Anion gap: 10 (ref 5–15)
BUN: 17 mg/dL (ref 6–20)
CO2: 23 mmol/L (ref 22–32)
Calcium: 9.1 mg/dL (ref 8.9–10.3)
Chloride: 109 mmol/L (ref 98–111)
Creatinine, Ser: 0.88 mg/dL (ref 0.61–1.24)
GFR, Estimated: >60 mL/min (ref >60)
Glucose, Bld: 191 mg/dL — ABNORMAL HIGH (ref 70–99)
Potassium: 4 mmol/L (ref 3.5–5.1)
Sodium: 142 mmol/L (ref 135–145)

## 2024-02-11 LAB — GLUCOSE, CAPILLARY: Glucose-Capillary: 185 mg/dL — ABNORMAL HIGH (ref 70–99)

## 2024-02-14 ENCOUNTER — Encounter (HOSPITAL_COMMUNITY): Payer: Self-pay

## 2024-02-14 NOTE — Progress Notes (Signed)
 Case: [id number] Date/Time: 02/17/24 1031   Procedure: ARTHROPLASTY, KNEE, TOTAL, USING IMAGELESS COMPUTER-ASSISTED NAVIGATION (Right: Knee)   Anesthesia type: Spinal   Diagnosis: Primary osteoarthritis of right knee [M17.11]   Pre-op diagnosis: Right knee osteoarthritis   Location: [facility] ROOM 08 / [facility]   Surgeons: Fidel Rogue, MD       DISCUSSION: Geoffrey Robinson is a 63 yo male with PMH of HTN, NICM, A.fib on Xarelto , TAA (41mm), severe OSA (uses CPAP), GERD, T2DM (A1c 6.7), Graves disease, arthritis.  Pt follows with Cardiology for PAF on Xarelto  and hx of NICM (echo in 06/2023 with LVEF 50-55). He takes Digoxin  since he cannot take Amiodarone  due to thyroid  disease. Also on Metoprolol . Last seen in EP clinic on 07/07/2023. Noted to be doing well. Advised f/u in 1 year. Cardiac clearance (scanned in media on 02/14/24) that patient is moderate risk, cleared from medical/cardiac perspective, and can hold Xarleto for 4 days.  Pt follows with Endocrine for Graves disease. Last TSH was .26 on 01/18/24. Patient on Methimazole  Mon-Fri.  Seen by PCP on 12/29/23. All issues stable. Medical clearance signed patient is medically cleared (scanned in media on 02/14/24)  LD Xarelto : 8/24 LD Mounjaro : 8/5  VS: BP (!) 148/76   Pulse (!) 56   Temp 36.8 C (Oral)   Resp 16   Ht 6' 1 (1.854 m)   Wt 129.7 kg   SpO2 99%   BMI 37.73 kg/m   PROVIDERS: Theophilus Andrews, Tully GRADE, MD   LABS: Labs reviewed: Acceptable for surgery. (all labs ordered are listed, but only abnormal results are displayed)  Labs Reviewed  SURGICAL PCR SCREEN - Abnormal; Notable for the following components:      Result Value   Staphylococcus aureus POSITIVE (*)    All other components within normal limits  HEMOGLOBIN A1C - Abnormal; Notable for the following components:   Hgb A1c MFr Bld 6.7 (*)    All other components within normal limits  BASIC METABOLIC PANEL WITH GFR - Abnormal; Notable for the following  components:   Glucose, Bld 191 (*)    All other components within normal limits  GLUCOSE, CAPILLARY - Abnormal; Notable for the following components:   Glucose-Capillary 185 (*)    All other components within normal limits  CBC     IMAGES: CTA chest 07/29/23:  IMPRESSION: 1. 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 Continue annual imaging followup by CTA. This recommendation follows 2010 ACCF/AHA/AATS/ACR/ASA/SCA/SCAI/SIR/STS/SVM Guidelines for the Diagnosis and Management of Patients with Thoracic Aortic Disease. Circulation. 2010; 121: Z733-z630. Aortic aneurysm NOS (ICD10-I71.9) 2. 2.2 cm LEFT thyroid  nodule. Recommend routine US  Thyroid  for further evaluation. Reference: J Am Coll Radiol. 2015 Feb;12(2): 143-50  EKG 02/11/24:  Sinus bradycardia, rate 55 Right bundle branch block Abnormal ECG When compared with ECG of 07-Jan-2023 15:54, now in sinus  CV:  Echo 06/30/23:  IMPRESSIONS    1. Left ventricular ejection fraction, by estimation, is 50 to 55%. The left ventricle has low normal function. The left ventricle has no regional wall motion abnormalities. There is mild left ventricular hypertrophy. Left ventricular diastolic parameters are indeterminate.  2. Right ventricular systolic function is normal. The right ventricular size is normal. Tricuspid regurgitation signal is inadequate for assessing PA pressure.  3. The mitral valve is normal in structure. Trivial mitral valve regurgitation. No evidence of mitral stenosis.  4. The aortic valve is tricuspid. Aortic valve regurgitation is trivial. No aortic stenosis is present.  5. Aortic  dilatation noted. There is dilatation of the aortic root, measuring 40 mm. There is dilatation of the ascending aorta, measuring 43 mm.  Event monitor 06/22/23:  Atrial fib with a slow VR (48/min), CVR (ave HR 77/min) and RVR (115/min) No prolonged pauses. No VT Rare PVC's.  Symptoms submitted show atrial  fib with a CVR. Patch Wear Time:  2 days and 21 hours    Past Medical History:  Diagnosis Date   Arthritis    Ascending aorta dilatation (HCC)    41mm by CT>>normal when indexed for BSA   Back pain    Bradycardia 10/21/2015   Diabetes mellitus without complication (HCC)    Dyslipidemia    Heart burn    High cholesterol    HTN (hypertension)    Joint pain    NICM (nonischemic cardiomyopathy) (HCC)    a. LHC 2002: no CAD, EF 35-40%; b. Echo 2003: EF 60% - tachycardia mediated (AFlutter with RVR).  EF 50% on echo 11/2020   Obesity    Palpitation    Persistent atrial fibrillation (HCC)    CHADS2VASC score is 2.  s/p remote afib and aflutter ablations   Severe obstructive sleep apnea    , CPAP at 14cm H2O   Swelling of lower extremity    Varicose veins     Past Surgical History:  Procedure Laterality Date   afib ablation      BIOPSY THYROID      10/2023   CARDIAC SURGERY     CARDIOVERSION N/A 09/07/2019   Procedure: CARDIOVERSION;  Surgeon: Loni Soyla LABOR, MD;  Location: [facility];  Service: Cardiovascular;  Laterality: N/A;   COLONOSCOPY     right knee surgery      VARICOSE VEIN SURGERY      MEDICATIONS:  acetaminophen  (TYLENOL ) 500 MG tablet   digoxin  (LANOXIN ) 0.125 MG tablet   diltiazem  (CARDIZEM  CD) 360 MG 24 hr capsule   fenofibrate  (TRICOR ) 145 MG tablet   finasteride  (PROSCAR ) 5 MG tablet   furosemide  (LASIX ) 20 MG tablet   lisinopril  (ZESTRIL ) 40 MG tablet   methimazole  (TAPAZOLE ) 5 MG tablet   metoprolol  tartrate (LOPRESSOR ) 100 MG tablet   omeprazole  (PRILOSEC) 40 MG capsule   tamsulosin  (FLOMAX ) 0.4 MG CAPS capsule   tirzepatide  (MOUNJARO ) 7.5 MG/0.5ML Pen   Vitamin D , Ergocalciferol , (DRISDOL ) 1.25 MG (50000 UNIT) CAPS capsule   XARELTO  20 MG TABS tablet   No current facility-administered medications for this encounter.   Burnard CHRISTELLA Odis DEVONNA [facility] Short Stay/Anesthesiology [facility] Phone [phone] 02/14/2024 1:08 PM

## 2024-02-14 NOTE — Anesthesia Preprocedure Evaluation (Signed)
 Anesthesia Evaluation    Airway        Dental   Pulmonary           Cardiovascular hypertension,      Neuro/Psych    GI/Hepatic   Endo/Other  diabetes    Renal/GU      Musculoskeletal   Abdominal   Peds  Hematology   Anesthesia Other Findings   Reproductive/Obstetrics                              Anesthesia Physical Anesthesia Plan  ASA:   Anesthesia Plan:    Post-op Pain Management:    Induction:   PONV Risk Score and Plan:   Airway Management Planned:   Additional Equipment:   Intra-op Plan:   Post-operative Plan:   Informed Consent:   Plan Discussed with:   Anesthesia Plan Comments: (See PAT note from 8/22)         Anesthesia Quick Evaluation  02/11/2024                HCT                      40.7                02/11/2024                MCV                      89.1                02/11/2024                PLT                      205                 02/11/2024           )         Anesthesia Quick Evaluation

## 2024-02-15 ENCOUNTER — Ambulatory Visit: Admitting: Nurse Practitioner

## 2024-02-15 ENCOUNTER — Encounter: Payer: Self-pay | Admitting: Nurse Practitioner

## 2024-02-15 VITALS — BP 120/65 | HR 52 | Temp 98.2°F | Ht 72.0 in | Wt 285.0 lb

## 2024-02-15 DIAGNOSIS — E1169 Type 2 diabetes mellitus with other specified complication: Secondary | ICD-10-CM | POA: Diagnosis not present

## 2024-02-15 DIAGNOSIS — E0590 Thyrotoxicosis, unspecified without thyrotoxic crisis or storm: Secondary | ICD-10-CM

## 2024-02-15 DIAGNOSIS — Z7985 Long-term (current) use of injectable non-insulin antidiabetic drugs: Secondary | ICD-10-CM

## 2024-02-15 DIAGNOSIS — E66812 Obesity, class 2: Secondary | ICD-10-CM

## 2024-02-15 DIAGNOSIS — G4733 Obstructive sleep apnea (adult) (pediatric): Secondary | ICD-10-CM | POA: Diagnosis not present

## 2024-02-15 DIAGNOSIS — Z6838 Body mass index (BMI) 38.0-38.9, adult: Secondary | ICD-10-CM

## 2024-02-15 NOTE — Progress Notes (Signed)
 Office: [phone]  /  Fax: [phone]  WEIGHT SUMMARY AND BIOMETRICS  Weight Lost Since Last Visit: 2lb  Weight Gained Since Last Visit: 0lb   Vitals Temp: 98.2 F (36.8 C) BP: 120/65 Pulse Rate: (!) 52 SpO2: 97 %   Anthropometric Measurements Height: 6' (1.829 m) Weight: 285 lb (129.3 kg) BMI (Calculated): 38.64 Weight at Last Visit: 287lb Weight Lost Since Last Visit: 2lb Weight Gained Since Last Visit: 0lb Starting Weight: 339lb Total Weight Loss (lbs): 54 lb (24.5 kg)   Body Composition  Body Fat %: 41.4 % Fat Mass (lbs): 118 lbs Muscle Mass (lbs): 159 lbs Visceral Fat Rating : 25   Other Clinical Data Fasting: Yes Labs: No Today's Visit #: 25 Starting Date: 01/14/22     HPI  Chief Complaint: OBESITY  Geoffrey Robinson is here to discuss his progress with his obesity treatment plan. He is on the the Category 4 Plan and states he is following his eating plan approximately 50 % of the time. He states he is exercising 0 minutes 0 days per week.   Interval History:  Since last office visit he has lost 2 pounds. He has overall done well with weight loss.  He is scheduled for right knee arthroplasty on 02/17/24.  He is spending the night in the hospital.  He stopped his mounjaro  2 weeks ago and took Vit D last over a week ago.  He is starting PT 5 days after surgery.   His highest weight was 392lbs.   Pharmacotherapy for weight loss: He is not currently taking medications for medical weight loss.     Previous pharmacotherapy for medical weight loss:  None   Bariatric surgery:  Patient has not had bariatric surgery.  Pharmacotherapy for DMT2:  He is currently taking Mounjaro  7.5mg  (stopped 2 weeks ago due to upcoming surgery).  Denies side effects.   Last A1c was 6.7 CBGs:   140-190s Episodes of hypoglycemia: no On Lisinopril  40mg .   Last eye exam:  8/24-had to reschedule due to surgery He doesn't want to take Metformin   Lab Results  Component Value  Date   HGBA1C 6.7 (H) 02/11/2024   HGBA1C 6.7 (H) 10/06/2023   HGBA1C 6.7 (H) 06/02/2023   Lab Results  Component Value Date   LDLCALC 101 (H) 10/06/2023   CREATININE 0.88 02/11/2024     Obstructive Sleep Apnea Geoffrey Robinson has a diagnosis of sleep apnea. He reports that he is using a CPAP regularly.  Hyperthyroidism Saw endo last on 01/18/24  -Thyroid  ultrasound revealed multinodular goiter in February, 2025   -Biopsy May 2nd-benign thyroid  FNA results.    -Cardiology stopped his amio and started him on digoxin  0.125mg  (started Dec 1st).   -Discussed labs with cardiology  -denies palpitations   -denies FH of thyroid  cancer  -he was started on Methimazoke 5mg  Mon-Friday.   -has follow up appt 10/31     PHYSICAL EXAM:  Blood pressure 120/65, pulse (!) 52, temperature 98.2 F (36.8 C), height 6' (1.829 m), weight 285 lb (129.3 kg), SpO2 97%. Body mass index is 38.65 kg/m.  General: He is overweight, cooperative, alert, well developed, and in no acute distress. PSYCH: Has normal mood, affect and thought process.   Extremities: No edema.  Neurologic: No gross sensory or motor deficits. No tremors or fasciculations noted.    DIAGNOSTIC DATA REVIEWED:  BMET    Component Value Date/Time   NA 142 02/11/2024 1129   NA 144 10/06/2023 0934   K 4.0  02/11/2024 1129   CL 109 02/11/2024 1129   CO2 23 02/11/2024 1129   GLUCOSE 191 (H) 02/11/2024 1129   BUN 17 02/11/2024 1129   BUN 19 10/06/2023 0934   CREATININE 0.88 02/11/2024 1129   CREATININE 0.99 03/11/2016 0853   CALCIUM 9.1 02/11/2024 1129   GFRNONAA >60 02/11/2024 1129   GFRAA >60 08/15/2019 0820   Lab Results  Component Value Date   HGBA1C 6.7 (H) 02/11/2024   HGBA1C  04/10/2009    5.9 (NOTE) The ADA recommends the following therapeutic goal for glycemic control related to Hgb A1c measurement: Goal of therapy: <6.5 Hgb A1c  Reference: American Diabetes Association: Clinical Practice Recommendations 2010,  Diabetes Care, 2010, 33: (Suppl  1).   Lab Results  Component Value Date   INSULIN  30.0 (H) 06/30/2022   INSULIN  27.1 (H) 01/14/2022   Lab Results  Component Value Date   TSH 0.26 (L) 01/18/2024   CBC    Component Value Date/Time   WBC 6.5 02/11/2024 1129   RBC 4.57 02/11/2024 1129   HGB 13.0 02/11/2024 1129   HGB 13.6 05/11/2023 0848   HCT 40.7 02/11/2024 1129   HCT 41.7 05/11/2023 0848   PLT 205 02/11/2024 1129   PLT 224 05/11/2023 0848   MCV 89.1 02/11/2024 1129   MCV 82 05/11/2023 0848   MCH 28.4 02/11/2024 1129   MCHC 31.9 02/11/2024 1129   RDW 14.0 02/11/2024 1129   RDW 15.2 05/11/2023 0848   Iron Studies No results found for: IRON, TIBC, FERRITIN, IRONPCTSAT Lipid Panel     Component Value Date/Time   CHOL 161 10/06/2023 0934   TRIG 174 (H) 10/06/2023 0934   HDL 29 (L) 10/06/2023 0934   CHOLHDL 4.6 04/11/2009 0451   VLDL 20 04/11/2009 0451   LDLCALC 101 (H) 10/06/2023 0934   Hepatic Function Panel     Component Value Date/Time   PROT 7.1 10/06/2023 0934   ALBUMIN 4.3 10/06/2023 0934   AST 21 10/06/2023 0934   ALT 26 10/06/2023 0934   ALKPHOS 87 10/06/2023 0934   BILITOT 0.4 10/06/2023 0934      Component Value Date/Time   TSH 0.26 (L) 01/18/2024 1333   Nutritional Lab Results  Component Value Date   VD25OH 36.6 10/06/2023   VD25OH 40.3 06/02/2023   VD25OH 43.1 02/24/2023     ASSESSMENT AND PLAN  TREATMENT PLAN FOR OBESITY:  Recommended Dietary Goals  Geoffrey Robinson is currently in the action stage of change. As such, his goal is to continue weight management plan. He has agreed to practicing portion control and making smarter food choices, such as increasing vegetables and decreasing simple carbohydrates. Not to actively work on weight loss while recovering from surgery.  Will see him back in one month and will reevaluate how he is doing at that time.    Behavioral Intervention  We discussed the following Behavioral Modification  Strategies today: increasing lean protein intake to established goals, decreasing simple carbohydrates , increasing vegetables, increasing fiber rich foods, work on meal planning and preparation, continue to work on maintaining a reduced calorie state, getting the recommended amount of protein, incorporating whole foods, making healthy choices, staying well hydrated and practicing mindfulness when eating., and increase protein intake, fibrous foods (25 grams per day for women, 30 grams for men) and water  to improve satiety and decrease hunger signals. .  Additional resources provided today: NA  Recommended Physical Activity Goals  Geoffrey Robinson has been advised to follow surgeon and PT's recommendations.  ASSOCIATED CONDITIONS ADDRESSED TODAY  Action/Plan  Type 2 diabetes mellitus with obesity (HCC) Patient to contact me after surgery once he is eating, drinking and have Bms to restart his Mounjaro . He is staying over night in the hospital after surgery and will monitor his sugars.  Severe obstructive sleep apnea Continue CPAP nightly especially when taking narcotics.  Patient verbalizes understanding  Hyperthyroidism Managed by Dr. Sam.  Keep follow up appts.    Obesity, Class II, BMI 35-39.9         Return in about 4 weeks (around 03/14/2024).SABRA He was informed of the importance of frequent follow up visits to maximize his success with intensive lifestyle modifications for his multiple health conditions.   ATTESTASTION STATEMENTS:  Reviewed by clinician on day of visit: allergies, medications, problem list, medical history, surgical history, family history, social history, and previous encounter notes.   Time spent on visit including pre-visit chart review and post-visit care and charting was 30 minutes discussing meal plan, knee surgery, reviewing chart before and after surgery, etc (see above note for additional discussions).    Geoffrey Robinson SAUNDERS. Geoffrey Leopard FNP-C

## 2024-02-17 ENCOUNTER — Ambulatory Visit (HOSPITAL_COMMUNITY)

## 2024-02-17 ENCOUNTER — Ambulatory Visit (HOSPITAL_COMMUNITY)
Admission: RE | Admit: 2024-02-17 | Discharge: 2024-02-18 | Disposition: A | Discharge status: Home / Self Care | Source: Ambulatory Visit | Attending: Orthopedic Surgery | Admitting: Orthopedic Surgery

## 2024-02-17 ENCOUNTER — Ambulatory Visit (HOSPITAL_COMMUNITY): Payer: Self-pay | Admitting: Medical

## 2024-02-17 ENCOUNTER — Other Ambulatory Visit: Payer: Self-pay

## 2024-02-17 ENCOUNTER — Encounter (HOSPITAL_COMMUNITY)
Admission: RE | Disposition: A | Discharge status: Home / Self Care | Payer: Self-pay | Source: Ambulatory Visit | Attending: Orthopedic Surgery

## 2024-02-17 ENCOUNTER — Encounter (HOSPITAL_COMMUNITY): Payer: Self-pay | Admitting: Orthopedic Surgery

## 2024-02-17 DIAGNOSIS — Z7901 Long term (current) use of anticoagulants: Secondary | ICD-10-CM | POA: Diagnosis not present

## 2024-02-17 DIAGNOSIS — Z7985 Long-term (current) use of injectable non-insulin antidiabetic drugs: Secondary | ICD-10-CM | POA: Diagnosis not present

## 2024-02-17 DIAGNOSIS — E1169 Type 2 diabetes mellitus with other specified complication: Secondary | ICD-10-CM

## 2024-02-17 DIAGNOSIS — I110 Hypertensive heart disease with heart failure: Secondary | ICD-10-CM | POA: Diagnosis not present

## 2024-02-17 DIAGNOSIS — I509 Heart failure, unspecified: Secondary | ICD-10-CM | POA: Insufficient documentation

## 2024-02-17 DIAGNOSIS — M1711 Unilateral primary osteoarthritis, right knee: Secondary | ICD-10-CM | POA: Insufficient documentation

## 2024-02-17 DIAGNOSIS — Z01818 Encounter for other preprocedural examination: Secondary | ICD-10-CM

## 2024-02-17 DIAGNOSIS — G4733 Obstructive sleep apnea (adult) (pediatric): Secondary | ICD-10-CM | POA: Diagnosis not present

## 2024-02-17 DIAGNOSIS — I4819 Other persistent atrial fibrillation: Secondary | ICD-10-CM | POA: Diagnosis not present

## 2024-02-17 DIAGNOSIS — Z6838 Body mass index (BMI) 38.0-38.9, adult: Secondary | ICD-10-CM | POA: Insufficient documentation

## 2024-02-17 DIAGNOSIS — I10 Essential (primary) hypertension: Secondary | ICD-10-CM | POA: Diagnosis not present

## 2024-02-17 DIAGNOSIS — Z96651 Presence of right artificial knee joint: Secondary | ICD-10-CM

## 2024-02-17 DIAGNOSIS — K219 Gastro-esophageal reflux disease without esophagitis: Secondary | ICD-10-CM | POA: Insufficient documentation

## 2024-02-17 HISTORY — PX: KNEE ARTHROPLASTY: SHX992

## 2024-02-17 LAB — GLUCOSE, CAPILLARY
Glucose-Capillary: 184 mg/dL — ABNORMAL HIGH (ref 70–99)
Glucose-Capillary: 209 mg/dL — ABNORMAL HIGH (ref 70–99)
Glucose-Capillary: 255 mg/dL — ABNORMAL HIGH (ref 70–99)

## 2024-02-17 SURGERY — ARTHROPLASTY, KNEE, TOTAL, USING IMAGELESS COMPUTER-ASSISTED NAVIGATION
Anesthesia: Spinal | Site: Knee | Laterality: Right

## 2024-02-17 MED ORDER — METOCLOPRAMIDE HCL 5 MG/ML IJ SOLN: 5.0000 mg | Freq: Three times a day (TID) | INTRAMUSCULAR | Status: DC | PRN

## 2024-02-17 MED ORDER — ACETAMINOPHEN 10 MG/ML IV SOLN: 1000.0000 mg | Freq: Once | INTRAVENOUS | Status: DC | PRN

## 2024-02-17 MED ORDER — INSULIN ASPART 100 UNIT/ML IJ SOLN
0.0000 [IU] | Freq: Three times a day (TID) | INTRAMUSCULAR | Status: DC
  Administered 2024-02-17 – 2024-02-18 (×2): 3 [IU] via SUBCUTANEOUS

## 2024-02-17 MED ORDER — STERILE WATER FOR IRRIGATION IR SOLN
Status: DC | PRN
  Administered 2024-02-17: 2000 mL

## 2024-02-17 MED ORDER — POVIDONE-IODINE 10 % EX SWAB: 2.0000 | Freq: Once | CUTANEOUS | Status: DC

## 2024-02-17 MED ORDER — SODIUM CHLORIDE 0.9 % IV SOLN: INTRAVENOUS | Status: DC

## 2024-02-17 MED ORDER — CEFAZOLIN SODIUM-DEXTROSE 2-4 GM/100ML-% IV SOLN
2.0000 g | Freq: Four times a day (QID) | INTRAVENOUS | Status: AC
  Administered 2024-02-17 (×2): 2 g via INTRAVENOUS
  Filled 2024-02-17 (×2): qty 100

## 2024-02-17 MED ORDER — ACETAMINOPHEN 500 MG PO TABS
1000.0000 mg | ORAL_TABLET | Freq: Four times a day (QID) | ORAL | Status: AC
  Administered 2024-02-17 – 2024-02-18 (×4): 1000 mg via ORAL
  Filled 2024-02-17 (×4): qty 2

## 2024-02-17 MED ORDER — METOPROLOL TARTRATE 50 MG PO TABS: 100.0000 mg | ORAL_TABLET | ORAL | Status: DC

## 2024-02-17 MED ORDER — LACTATED RINGERS IV SOLN: INTRAVENOUS | Status: DC

## 2024-02-17 MED ORDER — POLYETHYLENE GLYCOL 3350 17 G PO PACK: 17.0000 g | PACK | Freq: Every day | ORAL | Status: DC | PRN

## 2024-02-17 MED ORDER — ALUM & MAG HYDROXIDE-SIMETH 200-200-20 MG/5ML PO SUSP: 30.0000 mL | ORAL | Status: DC | PRN

## 2024-02-17 MED ORDER — PROPOFOL 500 MG/50ML IV EMUL
INTRAVENOUS | Status: DC | PRN
  Administered 2024-02-17: 140 ug/kg/min via INTRAVENOUS

## 2024-02-17 MED ORDER — ONDANSETRON HCL 4 MG/2ML IJ SOLN
INTRAMUSCULAR | Status: DC | PRN
  Administered 2024-02-17: 4 mg via INTRAVENOUS

## 2024-02-17 MED ORDER — MUPIROCIN 2 % EX OINT: 1.0000 | TOPICAL_OINTMENT | Freq: Two times a day (BID) | CUTANEOUS | 0 refills | Status: DC

## 2024-02-17 MED ORDER — DEXAMETHASONE SODIUM PHOSPHATE 10 MG/ML IJ SOLN
INTRAMUSCULAR | Status: DC | PRN
Start: 2024-02-17 — End: 2024-02-17
  Administered 2024-02-17: 10 mg via INTRAVENOUS

## 2024-02-17 MED ORDER — OXYCODONE HCL 5 MG PO TABS: 10.0000 mg | ORAL_TABLET | ORAL | Status: DC | PRN

## 2024-02-17 MED ORDER — VANCOMYCIN HCL 1000 MG IV SOLR
INTRAVENOUS | Status: DC | PRN
Start: 2024-02-17 — End: 2024-02-17
  Administered 2024-02-17: 1000 mg via TOPICAL

## 2024-02-17 MED ORDER — ROPIVACAINE HCL 5 MG/ML IJ SOLN
INTRAMUSCULAR | Status: DC | PRN
Start: 2024-02-17 — End: 2024-02-17
  Administered 2024-02-17: 30 mL via PERINEURAL

## 2024-02-17 MED ORDER — VITAMIN D (ERGOCALCIFEROL) 1.25 MG (50000 UNIT) PO CAPS: 50000.0000 [IU] | ORAL_CAPSULE | ORAL | Status: DC

## 2024-02-17 MED ORDER — ONDANSETRON HCL 4 MG PO TABS: 4.0000 mg | ORAL_TABLET | Freq: Four times a day (QID) | ORAL | Status: DC | PRN

## 2024-02-17 MED ORDER — PROPOFOL 1000 MG/100ML IV EMUL
INTRAVENOUS | Status: AC
  Filled 2024-02-17: qty 100

## 2024-02-17 MED ORDER — 0.9 % SODIUM CHLORIDE (POUR BTL) OPTIME
TOPICAL | Status: DC | PRN
  Administered 2024-02-17: 1000 mL

## 2024-02-17 MED ORDER — PANTOPRAZOLE SODIUM 40 MG PO TBEC
40.0000 mg | DELAYED_RELEASE_TABLET | Freq: Every day | ORAL | Status: DC
Start: 2024-02-17 — End: 2024-02-18
  Administered 2024-02-18: 40 mg via ORAL
  Filled 2024-02-17 (×2): qty 1

## 2024-02-17 MED ORDER — ORAL CARE MOUTH RINSE: 15.0000 mL | Freq: Once | OROMUCOSAL | Status: AC

## 2024-02-17 MED ORDER — MENTHOL 3 MG MT LOZG: 1.0000 | LOZENGE | OROMUCOSAL | Status: DC | PRN

## 2024-02-17 MED ORDER — ISOPROPYL ALCOHOL 70 % SOLN
Status: DC | PRN
  Administered 2024-02-17: 1 via TOPICAL

## 2024-02-17 MED ORDER — FENTANYL CITRATE PF 50 MCG/ML IJ SOSY
50.0000 ug | PREFILLED_SYRINGE | Freq: Once | INTRAMUSCULAR | Status: AC
  Administered 2024-02-17: 50 ug via INTRAVENOUS
  Filled 2024-02-17: qty 2

## 2024-02-17 MED ORDER — BISACODYL 10 MG RE SUPP: 10.0000 mg | Freq: Every day | RECTAL | Status: DC | PRN

## 2024-02-17 MED ORDER — FUROSEMIDE 20 MG PO TABS: 20.0000 mg | ORAL_TABLET | ORAL | Status: DC | PRN

## 2024-02-17 MED ORDER — MEPERIDINE HCL 100 MG/ML IJ SOLN: 6.2500 mg | INTRAMUSCULAR | Status: DC | PRN

## 2024-02-17 MED ORDER — FENOFIBRATE 160 MG PO TABS
160.0000 mg | ORAL_TABLET | Freq: Every day | ORAL | Status: DC
  Administered 2024-02-18: 160 mg via ORAL
  Filled 2024-02-17: qty 1

## 2024-02-17 MED ORDER — OXYCODONE HCL 5 MG/5ML PO SOLN: 5.0000 mg | Freq: Once | ORAL | Status: DC | PRN

## 2024-02-17 MED ORDER — ONDANSETRON HCL 4 MG/2ML IJ SOLN: 4.0000 mg | Freq: Four times a day (QID) | INTRAMUSCULAR | Status: DC | PRN

## 2024-02-17 MED ORDER — HYDROMORPHONE HCL 1 MG/ML IJ SOLN: 0.5000 mg | INTRAMUSCULAR | Status: DC | PRN

## 2024-02-17 MED ORDER — METHOCARBAMOL 1000 MG/10ML IJ SOLN: 500.0000 mg | Freq: Four times a day (QID) | INTRAMUSCULAR | Status: DC | PRN

## 2024-02-17 MED ORDER — METOCLOPRAMIDE HCL 5 MG PO TABS: 5.0000 mg | ORAL_TABLET | Freq: Three times a day (TID) | ORAL | Status: DC | PRN

## 2024-02-17 MED ORDER — DIPHENHYDRAMINE HCL 12.5 MG/5ML PO ELIX: 12.5000 mg | ORAL_SOLUTION | ORAL | Status: DC | PRN

## 2024-02-17 MED ORDER — DIGOXIN 125 MCG PO TABS
0.1250 mg | ORAL_TABLET | Freq: Every day | ORAL | Status: DC
  Administered 2024-02-18: 0.125 mg via ORAL
  Filled 2024-02-17: qty 1

## 2024-02-17 MED ORDER — ACETAMINOPHEN 500 MG PO TABS
1000.0000 mg | ORAL_TABLET | Freq: Once | ORAL | Status: AC
  Administered 2024-02-17: 1000 mg via ORAL
  Filled 2024-02-17: qty 2

## 2024-02-17 MED ORDER — CEFAZOLIN SODIUM-DEXTROSE 3-4 GM/150ML-% IV SOLN
3.0000 g | INTRAVENOUS | Status: AC
  Administered 2024-02-17: 3 g via INTRAVENOUS
  Filled 2024-02-17: qty 150

## 2024-02-17 MED ORDER — SODIUM CHLORIDE (PF) 0.9 % IJ SOLN
INTRAMUSCULAR | Status: DC | PRN
  Administered 2024-02-17: 61 mL

## 2024-02-17 MED ORDER — METOPROLOL TARTRATE 50 MG PO TABS
150.0000 mg | ORAL_TABLET | Freq: Every day | ORAL | Status: DC
  Administered 2024-02-17: 150 mg via ORAL
  Filled 2024-02-17: qty 3

## 2024-02-17 MED ORDER — MIDAZOLAM HCL 2 MG/2ML IJ SOLN
1.0000 mg | Freq: Once | INTRAMUSCULAR | Status: AC
  Administered 2024-02-17: 2 mg via INTRAVENOUS
  Filled 2024-02-17: qty 2

## 2024-02-17 MED ORDER — CEFAZOLIN SODIUM 1 G IJ SOLR
INTRAMUSCULAR | Status: AC
  Filled 2024-02-17: qty 20

## 2024-02-17 MED ORDER — DROPERIDOL 2.5 MG/ML IJ SOLN: 0.6250 mg | Freq: Once | INTRAMUSCULAR | Status: DC | PRN

## 2024-02-17 MED ORDER — DILTIAZEM HCL ER COATED BEADS 180 MG PO CP24
360.0000 mg | ORAL_CAPSULE | Freq: Every day | ORAL | Status: DC
  Administered 2024-02-18: 360 mg via ORAL
  Filled 2024-02-17: qty 2

## 2024-02-17 MED ORDER — HYDROMORPHONE HCL 1 MG/ML IJ SOLN: 0.2500 mg | INTRAMUSCULAR | Status: DC | PRN

## 2024-02-17 MED ORDER — SODIUM CHLORIDE 0.9 % IR SOLN
Status: DC | PRN
  Administered 2024-02-17: 250 mL
  Administered 2024-02-17: 3000 mL

## 2024-02-17 MED ORDER — SENNA 8.6 MG PO TABS
1.0000 | ORAL_TABLET | Freq: Two times a day (BID) | ORAL | Status: DC
  Administered 2024-02-17 – 2024-02-18 (×2): 8.6 mg via ORAL
  Filled 2024-02-17 (×2): qty 1

## 2024-02-17 MED ORDER — OXYCODONE HCL 5 MG PO TABS: 5.0000 mg | ORAL_TABLET | Freq: Once | ORAL | Status: DC | PRN

## 2024-02-17 MED ORDER — MUPIROCIN 2 % EX OINT
TOPICAL_OINTMENT | Freq: Two times a day (BID) | CUTANEOUS | Status: DC
  Filled 2024-02-17: qty 22

## 2024-02-17 MED ORDER — INSULIN ASPART 100 UNIT/ML IJ SOLN
0.0000 [IU] | Freq: Every day | INTRAMUSCULAR | Status: DC
  Administered 2024-02-17: 3 [IU] via SUBCUTANEOUS

## 2024-02-17 MED ORDER — METHIMAZOLE 5 MG PO TABS
5.0000 mg | ORAL_TABLET | ORAL | Status: DC
  Administered 2024-02-18: 5 mg via ORAL
  Filled 2024-02-17: qty 1

## 2024-02-17 MED ORDER — RIVAROXABAN 10 MG PO TABS
10.0000 mg | ORAL_TABLET | Freq: Every day | ORAL | Status: DC
  Administered 2024-02-18: 10 mg via ORAL
  Filled 2024-02-17: qty 1

## 2024-02-17 MED ORDER — CHLORHEXIDINE GLUCONATE 0.12 % MT SOLN
15.0000 mL | Freq: Once | OROMUCOSAL | Status: AC
  Administered 2024-02-17: 15 mL via OROMUCOSAL

## 2024-02-17 MED ORDER — SODIUM CHLORIDE (PF) 0.9 % IJ SOLN
INTRAMUSCULAR | Status: AC
  Filled 2024-02-17: qty 30

## 2024-02-17 MED ORDER — FINASTERIDE 5 MG PO TABS
5.0000 mg | ORAL_TABLET | Freq: Every day | ORAL | Status: DC
  Filled 2024-02-17: qty 1

## 2024-02-17 MED ORDER — CHLORHEXIDINE GLUCONATE 4 % EX SOLN: 1.0000 | CUTANEOUS | 1 refills | Status: DC

## 2024-02-17 MED ORDER — ACETAMINOPHEN 325 MG PO TABS: 325.0000 mg | ORAL_TABLET | Freq: Four times a day (QID) | ORAL | Status: DC | PRN

## 2024-02-17 MED ORDER — VANCOMYCIN HCL 1000 MG IV SOLR
INTRAVENOUS | Status: AC
  Filled 2024-02-17: qty 20

## 2024-02-17 MED ORDER — BUPIVACAINE-EPINEPHRINE (PF) 0.25% -1:200000 IJ SOLN
INTRAMUSCULAR | Status: AC
  Filled 2024-02-17: qty 30

## 2024-02-17 MED ORDER — OXYCODONE HCL 5 MG PO TABS
5.0000 mg | ORAL_TABLET | ORAL | Status: DC | PRN
  Administered 2024-02-17 – 2024-02-18 (×4): 5 mg via ORAL
  Filled 2024-02-17 (×5): qty 1

## 2024-02-17 MED ORDER — TAMSULOSIN HCL 0.4 MG PO CAPS
0.4000 mg | ORAL_CAPSULE | Freq: Every day | ORAL | Status: DC
  Administered 2024-02-17: 0.4 mg via ORAL
  Filled 2024-02-17: qty 1

## 2024-02-17 MED ORDER — ONDANSETRON HCL 4 MG/2ML IJ SOLN
INTRAMUSCULAR | Status: AC
  Filled 2024-02-17: qty 2

## 2024-02-17 MED ORDER — BUPIVACAINE IN DEXTROSE 0.75-8.25 % IT SOLN
INTRATHECAL | Status: DC | PRN
Start: 2024-02-17 — End: 2024-02-17
  Administered 2024-02-17: 2 mL via INTRATHECAL

## 2024-02-17 MED ORDER — DOCUSATE SODIUM 100 MG PO CAPS
100.0000 mg | ORAL_CAPSULE | Freq: Two times a day (BID) | ORAL | Status: DC
  Administered 2024-02-17 – 2024-02-18 (×2): 100 mg via ORAL
  Filled 2024-02-17 (×2): qty 1

## 2024-02-17 MED ORDER — PHENOL 1.4 % MT LIQD
1.0000 | OROMUCOSAL | Status: DC | PRN
Start: 2024-02-17 — End: 2024-02-18

## 2024-02-17 MED ORDER — KETOROLAC TROMETHAMINE 30 MG/ML IJ SOLN
INTRAMUSCULAR | Status: AC
  Filled 2024-02-17: qty 1

## 2024-02-17 MED ORDER — METHOCARBAMOL 500 MG PO TABS
500.0000 mg | ORAL_TABLET | Freq: Four times a day (QID) | ORAL | Status: DC | PRN
  Filled 2024-02-17: qty 1

## 2024-02-17 MED ORDER — DEXAMETHASONE SODIUM PHOSPHATE 10 MG/ML IJ SOLN
INTRAMUSCULAR | Status: AC
  Filled 2024-02-17: qty 1

## 2024-02-17 MED ORDER — METOPROLOL TARTRATE 50 MG PO TABS
100.0000 mg | ORAL_TABLET | Freq: Every day | ORAL | Status: DC
  Administered 2024-02-18: 100 mg via ORAL
  Filled 2024-02-17: qty 2

## 2024-02-17 MED ORDER — TRANEXAMIC ACID-NACL 1000-0.7 MG/100ML-% IV SOLN
1000.0000 mg | INTRAVENOUS | Status: AC
  Administered 2024-02-17: 1000 mg via INTRAVENOUS
  Filled 2024-02-17: qty 100

## 2024-02-17 SURGICAL SUPPLY — 58 items
BAG COUNTER SPONGE SURGICOUNT (BAG) IMPLANT
BAG ZIPLOCK 12X15 (MISCELLANEOUS) ×1 IMPLANT
BATTERY INSTRU NAVIGATION (MISCELLANEOUS) ×3 IMPLANT
BLADE SAW RECIPROCATING 77.5 (BLADE) IMPLANT
BLADE SAW SAG 35X64 .89 (BLADE) IMPLANT
BNDG ELASTIC 4INX 5YD STR LF (GAUZE/BANDAGES/DRESSINGS) ×1 IMPLANT
BNDG ELASTIC 6INX 5YD STR LF (GAUZE/BANDAGES/DRESSINGS) ×1 IMPLANT
CHLORAPREP W/TINT 26 (MISCELLANEOUS) ×2 IMPLANT
COMPONENT FEM STD PS KN CR12RT (Joint) IMPLANT
COMPONENT PATELLA 3 PEG 41 (Joint) IMPLANT
COMPONET TIB PS KNEE 0D H RT (Joint) IMPLANT
COVER SURGICAL LIGHT HANDLE (MISCELLANEOUS) ×1 IMPLANT
DERMABOND ADVANCED .7 DNX12 (GAUZE/BANDAGES/DRESSINGS) ×2 IMPLANT
DRAPE SHEET LG 3/4 BI-LAMINATE (DRAPES) ×3 IMPLANT
DRAPE U-SHAPE 47X51 STRL (DRAPES) ×1 IMPLANT
DRSG AQUACEL AG ADV 3.5X10 (GAUZE/BANDAGES/DRESSINGS) ×1 IMPLANT
ELECT BLADE TIP CTD 4 INCH (ELECTRODE) ×1 IMPLANT
ELECT PENCIL ROCKER SW 15FT (MISCELLANEOUS) ×1 IMPLANT
ELECT REM PT RETURN 15FT ADLT (MISCELLANEOUS) ×1 IMPLANT
GAUZE SPONGE 4X4 12PLY STRL (GAUZE/BANDAGES/DRESSINGS) ×1 IMPLANT
GLOVE BIO SURGEON STRL SZ7 (GLOVE) ×1 IMPLANT
GLOVE BIO SURGEON STRL SZ8.5 (GLOVE) ×2 IMPLANT
GLOVE BIOGEL PI IND STRL 7.5 (GLOVE) ×1 IMPLANT
GLOVE BIOGEL PI IND STRL 8.5 (GLOVE) ×1 IMPLANT
GOWN SPEC L3 XXLG W/TWL (GOWN DISPOSABLE) ×1 IMPLANT
GOWN STRL REUS W/ TWL XL LVL3 (GOWN DISPOSABLE) ×1 IMPLANT
HOLDER FOLEY CATH W/STRAP (MISCELLANEOUS) ×1 IMPLANT
HOOD PEEL AWAY T7 (MISCELLANEOUS) ×3 IMPLANT
KIT TURNOVER KIT A (KITS) ×1 IMPLANT
LINER TIB PS RM GH/12 16 RT (Liner) IMPLANT
MARKER SKIN DUAL TIP RULER LAB (MISCELLANEOUS) ×1 IMPLANT
NDL SAFETY ECLIPSE 18X1.5 (NEEDLE) ×1 IMPLANT
NDL SPNL 18GX3.5 QUINCKE PK (NEEDLE) ×1 IMPLANT
NEEDLE SPNL 18GX3.5 QUINCKE PK (NEEDLE) ×1 IMPLANT
NS IRRIG 1000ML POUR BTL (IV SOLUTION) ×1 IMPLANT
PACK TOTAL KNEE CUSTOM (KITS) ×1 IMPLANT
PADDING CAST COTTON 6X4 STRL (CAST SUPPLIES) ×1 IMPLANT
PIN DRILL HDLS TROCAR 75 4PK (PIN) IMPLANT
PROTECTOR NERVE ULNAR (MISCELLANEOUS) ×1 IMPLANT
SAW OSC TIP CART 19.5X105X1.3 (SAW) IMPLANT
SCREW FEMALE HEX FIX 25X2.5 (ORTHOPEDIC DISPOSABLE SUPPLIES) IMPLANT
SEALER BIPOLAR AQUA 6.0 (INSTRUMENTS) ×1 IMPLANT
SET HNDPC FAN SPRY TIP SCT (DISPOSABLE) ×1 IMPLANT
SET PAD KNEE POSITIONER (MISCELLANEOUS) ×1 IMPLANT
SOLUTION PRONTOSAN WOUND 350ML (IRRIGATION / IRRIGATOR) ×1 IMPLANT
SPIKE FLUID TRANSFER (MISCELLANEOUS) ×2 IMPLANT
SUT ETHIBOND NAB CT1 #1 30IN (SUTURE) IMPLANT
SUT MNCRL AB 3-0 PS2 18 (SUTURE) ×1 IMPLANT
SUT MON AB 2-0 CT1 36 (SUTURE) ×1 IMPLANT
SUT STRATAFIX 14 PDO 48 VLT (SUTURE) ×1 IMPLANT
SUT VIC AB 1 CTX36XBRD ANBCTR (SUTURE) ×2 IMPLANT
SUT VIC AB 2-0 CT1 TAPERPNT 27 (SUTURE) ×1 IMPLANT
SYR 3ML LL SCALE MARK (SYRINGE) ×1 IMPLANT
TOWEL GREEN STERILE FF (TOWEL DISPOSABLE) ×1 IMPLANT
TRAY FOLEY MTR SLVR 16FR STAT (SET/KITS/TRAYS/PACK) ×1 IMPLANT
TUBE SUCTION HIGH CAP CLEAR NV (SUCTIONS) ×1 IMPLANT
WATER STERILE IRR 1000ML POUR (IV SOLUTION) ×2 IMPLANT
WRAP KNEE MAXI GEL POST OP (GAUZE/BANDAGES/DRESSINGS) ×1 IMPLANT

## 2024-02-17 NOTE — Op Note (Signed)
 OPERATIVE REPORT  SURGEON: Redell Shoals, MD   ASSISTANT: Valery [name], PA-C  PREOPERATIVE DIAGNOSIS: Primary Right knee arthritis.   POSTOPERATIVE DIAGNOSIS: Primary Right knee arthritis.   PROCEDURE: Computer assisted Right total knee arthroplasty.   IMPLANTS: Zimmer Persona PPS Cementless CR femur, size 12. Persona 0 degree Spiked Keel OsseoTi Tibia, size H. Vivacit-E polyethelyene insert, size 16 mm, MC. OsseoTi 3-Peg patella, size 41 mm.  ANESTHESIA:  MAC, Regional, and Spinal  TOURNIQUET TIME: Not utilized.   ESTIMATED BLOOD LOSS: 350 mL.    ANTIBIOTICS: 3g Ancef .  DRAINS: None.  COMPLICATIONS: None   CONDITION: PACU - hemodynamically stable.   BRIEF CLINICAL NOTE: Geoffrey Robinson is a 63 y.o. male with a long-standing history of Right knee arthritis. After failing conservative management, the patient was indicated for total knee arthroplasty. The risks, benefits, and alternatives to the procedure were explained, and the patient elected to proceed.  PROCEDURE IN DETAIL: Adductor canal block was obtained in the pre-op holding area. Once inside the operative room, spinal anesthesia was obtained, and a foley catheter was inserted. The patient was then positioned and the lower extremity was prepped and draped in the normal sterile surgical fashion.  A time-out was called verifying side and site of surgery. The patient received IV antibiotics within 60 minutes of beginning the procedure. A tourniquet was not utilized.   An anterior approach to the knee was performed utilizing a midvastus arthrotomy. A medial release was performed and the patellar fat pad was excised. Stryker imageless navigation was used to cut the distal femur perpendicular to the mechanical axis. A freehand patellar resection was performed, and the patella was sized and prepared with 3 lug holes.  Nagivation was used to make a neutral proximal tibia resection, taking 9 mm of bone from the less affected lateral  side with 3 degrees of slope. The menisci were excised. A spacer block was placed, and the alignment and balance in extension were confirmed.   The distal femur was sized using the 3-degree external rotation guide referencing the posterior femoral cortex. The appropriate 4-in-1 cutting block was pinned into place. Rotation was checked using Whiteside's line, the epicondylar axis, and then confirmed with a spacer block in flexion. The remaining femoral cuts were performed, taking care to protect the MCL.  The tibia was sized and the trial tray was pinned into place. The remaining trail components were inserted. The knee was stable to varus and valgus stress through a full range of motion. The patella tracked centrally, and the PCL was well balanced. The trial components were removed, and the proximal tibial surface was prepared. Final components were impacted into place. The knee was tested for a final time and found to be well balanced.   The wound was copiously irrigated with Prontosan solution and normal saline using pulse lavage.  Marcaine  solution was injected into the periarticular soft tissue.  The wound was closed in layers using #1 Vicryl and Stratafix for the fascia, 2-0 Vicryl for the subcutaneous fat, 2-0 Monocryl for the deep dermal layer, 3-0 running Monocryl subcuticular Stitch, and 4-0 Monocryl stay sutures at both ends of the wound. Dermabond was applied to the skin.  Once the glue was fully dried, an Aquacell Ag and compressive dressing were applied.  The patient was transported to the recovery room in stable condition.  Sponge, needle, and instrument counts were correct at the end of the case x2.  The patient tolerated the procedure well and there were no known  complications.  Please note that a surgical assistant was a medical necessity for this procedure in order to perform it in a safe and expeditious manner. Surgical assistant was necessary to retract the ligaments and vital  neurovascular structures to prevent injury to them and also necessary for proper positioning of the limb to allow for anatomic placement of the prosthesis.

## 2024-02-17 NOTE — Discharge Instructions (Signed)

## 2024-02-17 NOTE — Interval H&P Note (Signed)
 History and Physical Interval Note:  02/17/2024 10:33 AM  Geoffrey Robinson  has presented today for surgery, with the diagnosis of Right knee osteoarthritis.  The various methods of treatment have been discussed with the patient and family. After consideration of risks, benefits and other options for treatment, the patient has consented to  Procedure(s): ARTHROPLASTY, KNEE, TOTAL, USING IMAGELESS COMPUTER-ASSISTED NAVIGATION (Right) as a surgical intervention.  The patient's history has been reviewed, patient examined, no change in status, stable for surgery.  I have reviewed the patient's chart and labs.  Questions were answered to the patient's satisfaction.    The risks, benefits, and alternatives were discussed with the patient. There are risks associated with the surgery including, but not limited to, problems with anesthesia (death), infection, instability (giving out of the joint), dislocation, differences in leg length/angulation/rotation, fracture of bones, loosening or failure of implants, hematoma (blood accumulation) which may require surgical drainage, blood clots, pulmonary embolism, nerve injury (foot drop and lateral thigh numbness), and blood vessel injury. The patient understands these risks and elects to proceed.    Geoffrey Robinson

## 2024-02-17 NOTE — Anesthesia Procedure Notes (Signed)
 Anesthesia Regional Block: Adductor canal block   Pre-Anesthetic Checklist: , timeout performed,  Correct Patient, Correct Site, Correct Laterality,  Correct Procedure, Correct Position, site marked,  Risks and benefits discussed,  Surgical consent,  Pre-op evaluation,  At surgeon's request and post-op pain management  Laterality: Right  Prep: chloraprep       Needles:  Injection technique: Single-shot  Needle Type: Echogenic Stimulator Needle     Needle Length: 9cm  Needle Gauge: 21     Additional Needles:   Procedures:,,,, ultrasound used (permanent image in chart),,    Narrative:  Start time: 02/17/2024 10:35 AM End time: 02/17/2024 10:40 AM Injection made incrementally with aspirations every 5 mL.  Performed by: Personally  Anesthesiologist: Tilford Franky BIRCH, MD  Additional Notes: Discussed risks and benefits of the nerve block in detail, including but not limited vascular injury, permanent nerve damage and infection.   Patient tolerated the procedure well. Local anesthetic introduced in an incremental fashion under minimal resistance after negative aspirations. No paresthesias were elicited. After completion of the procedure, no acute issues were identified and patient continued to be monitored by RN.

## 2024-02-17 NOTE — Anesthesia Postprocedure Evaluation (Signed)
 Anesthesia Post Note  Patient: Geoffrey Robinson  Procedure(s) Performed: ARTHROPLASTY, KNEE, TOTAL, USING IMAGELESS COMPUTER-ASSISTED NAVIGATION (Right: Knee)     Patient location during evaluation: PACU Anesthesia Type: Spinal Level of consciousness: oriented and awake and alert Pain management: pain level controlled Vital Signs Assessment: post-procedure vital signs reviewed and stable Respiratory status: spontaneous breathing, respiratory function stable and patient connected to nasal cannula oxygen Cardiovascular status: blood pressure returned to baseline and stable Postop Assessment: no headache, no backache and no apparent nausea or vomiting Anesthetic complications: no   No notable events documented.  Last Vitals:  Vitals:   02/17/24 1600 02/17/24 1614  BP: (!) 155/80 (!) 159/76  Pulse: (!) 47 (!) 48  Resp: 15 18  Temp:  36.4 C  SpO2: 95% 96%    Last Pain:  Vitals:   02/17/24 1614  TempSrc: Oral  PainSc:                  Isabelly Kobler D Pinchas Reither

## 2024-02-17 NOTE — Anesthesia Procedure Notes (Signed)
 Spinal  Start time: 02/17/2024 11:40 AM End time: 02/17/2024 11:43 AM Reason for block: surgical anesthesia Staffing Performed: anesthesiologist  Anesthesiologist: Tilford Franky BIRCH, MD Performed by: Tilford Franky BIRCH, MD Authorized by: Tilford Franky BIRCH, MD   Preanesthetic Checklist Completed: patient identified, IV checked, site marked, risks and benefits discussed, surgical consent, monitors and equipment checked, pre-op evaluation and timeout performed Spinal Block Patient position: sitting Prep: DuraPrep and site prepped and draped Location: L3-4 Injection technique: single-shot Needle Needle type: Pencan  Needle gauge: 24 G Needle length: 10 cm Needle insertion depth: 10 cm Additional Notes Patient tolerated well. No immediate complications.  Functioning IV was confirmed and monitors were applied. Sterile prep and drape, including hand hygiene and sterile gloves were used. The patient was positioned and the back was prepped. The skin was anesthetized with lidocaine . Free flow of clear CSF was obtained prior to injecting local anesthetic into the CSF. The spinal needle aspirated freely following injection. The needle was carefully withdrawn. The patient tolerated the procedure well.

## 2024-02-17 NOTE — Transfer of Care (Signed)
 Immediate Anesthesia Transfer of Care Note  Patient: Geoffrey Robinson  Procedure(s) Performed: ARTHROPLASTY, KNEE, TOTAL, USING IMAGELESS COMPUTER-ASSISTED NAVIGATION (Right: Knee)  Patient Location: PACU  Anesthesia Type:MAC and Spinal  Level of Consciousness: awake, alert , and oriented  Airway & Oxygen Therapy: Patient Spontanous Breathing  Post-op Assessment: Report given to RN and Post -op Vital signs reviewed and stable  Post vital signs: Reviewed and stable  Last Vitals:  Vitals Value Taken Time  BP 130/71 02/17/24 15:23  Temp 36.8 C 02/17/24 15:23  Pulse 50 02/17/24 15:29  Resp 20 02/17/24 15:29  SpO2 92 % 02/17/24 15:29  Vitals shown include unfiled device data.  Last Pain:  Vitals:   02/17/24 1523  TempSrc:   PainSc: 0-No pain         Complications: No notable events documented.

## 2024-02-18 ENCOUNTER — Other Ambulatory Visit (HOSPITAL_COMMUNITY): Payer: Self-pay

## 2024-02-18 DIAGNOSIS — M1711 Unilateral primary osteoarthritis, right knee: Secondary | ICD-10-CM | POA: Diagnosis not present

## 2024-02-18 DIAGNOSIS — Z96651 Presence of right artificial knee joint: Secondary | ICD-10-CM

## 2024-02-18 LAB — CBC
HCT: 36.1 % — ABNORMAL LOW (ref 39.0–52.0)
Hemoglobin: 11.6 g/dL — ABNORMAL LOW (ref 13.0–17.0)
MCH: 28.5 pg (ref 26.0–34.0)
MCHC: 32.1 g/dL (ref 30.0–36.0)
MCV: 88.7 fL (ref 80.0–100.0)
Platelets: 164 K/uL (ref 150–400)
RBC: 4.07 MIL/uL — ABNORMAL LOW (ref 4.22–5.81)
RDW: 13.6 % (ref 11.5–15.5)
WBC: 8.8 K/uL (ref 4.0–10.5)
nRBC: 0 % (ref 0.0–0.2)

## 2024-02-18 LAB — BASIC METABOLIC PANEL WITH GFR
Anion gap: 12 (ref 5–15)
BUN: 16 mg/dL (ref 6–20)
CO2: 22 mmol/L (ref 22–32)
Calcium: 8.4 mg/dL — ABNORMAL LOW (ref 8.9–10.3)
Chloride: 105 mmol/L (ref 98–111)
Creatinine, Ser: 0.83 mg/dL (ref 0.61–1.24)
GFR, Estimated: >60 mL/min (ref >60)
Glucose, Bld: 307 mg/dL — ABNORMAL HIGH (ref 70–99)
Potassium: 4.1 mmol/L (ref 3.5–5.1)
Sodium: 138 mmol/L (ref 135–145)

## 2024-02-18 LAB — GLUCOSE, CAPILLARY
Glucose-Capillary: 223 mg/dL — ABNORMAL HIGH (ref 70–99)
Glucose-Capillary: 177 mg/dL — ABNORMAL HIGH (ref 70–99)

## 2024-02-18 MED ORDER — MUPIROCIN 2 % EX OINT
1.0000 | TOPICAL_OINTMENT | Freq: Two times a day (BID) | CUTANEOUS | 0 refills | Status: AC
  Filled 2024-02-18: qty 66, 30d supply, fill #0

## 2024-02-18 MED ORDER — CHLORHEXIDINE GLUCONATE 4 % EX SOLN
1.0000 | CUTANEOUS | 1 refills | Status: DC
  Filled 2024-02-18: qty 946, 42d supply, fill #0

## 2024-02-18 MED ORDER — INSULIN ASPART 100 UNIT/ML IJ SOLN
0.0000 [IU] | Freq: Three times a day (TID) | INTRAMUSCULAR | Status: DC
  Administered 2024-02-18: 3 [IU] via SUBCUTANEOUS

## 2024-02-18 MED ORDER — DOCUSATE SODIUM 100 MG PO CAPS
100.0000 mg | ORAL_CAPSULE | Freq: Two times a day (BID) | ORAL | 0 refills | Status: AC
  Filled 2024-02-18: qty 60, 30d supply, fill #0

## 2024-02-18 MED ORDER — POLYETHYLENE GLYCOL 3350 17 GM/SCOOP PO POWD
17.0000 g | Freq: Every day | ORAL | 0 refills | Status: AC | PRN
  Filled 2024-02-18: qty 238, 14d supply, fill #0

## 2024-02-18 MED ORDER — METHOCARBAMOL 500 MG PO TABS
500.0000 mg | ORAL_TABLET | Freq: Four times a day (QID) | ORAL | 0 refills | Status: DC | PRN
  Filled 2024-02-18: qty 20, 5d supply, fill #0

## 2024-02-18 MED ORDER — SENNA 8.6 MG PO TABS
2.0000 | ORAL_TABLET | Freq: Every day | ORAL | 0 refills | Status: AC
  Filled 2024-02-18: qty 30, 15d supply, fill #0

## 2024-02-18 MED ORDER — INSULIN ASPART 100 UNIT/ML IJ SOLN: 0.0000 [IU] | Freq: Every day | INTRAMUSCULAR | Status: DC

## 2024-02-18 MED ORDER — ACETAMINOPHEN 500 MG PO TABS
1000.0000 mg | ORAL_TABLET | Freq: Three times a day (TID) | ORAL | 0 refills | Status: AC | PRN
  Filled 2024-02-18: qty 30, 5d supply, fill #0

## 2024-02-18 MED ORDER — OXYCODONE HCL 5 MG PO TABS
5.0000 mg | ORAL_TABLET | ORAL | 0 refills | Status: DC | PRN
  Filled 2024-02-18: qty 40, 7d supply, fill #0

## 2024-02-18 MED ORDER — ONDANSETRON HCL 4 MG PO TABS
4.0000 mg | ORAL_TABLET | Freq: Three times a day (TID) | ORAL | 0 refills | Status: DC | PRN
  Filled 2024-02-18: qty 18, 21d supply, fill #0

## 2024-02-18 NOTE — Progress Notes (Signed)
    Subjective:  Patient reports pain as mild to moderate.  Denies N/V/CP/SOB/Abd pain. Denies tingling or numbness in LE bilaterally.  Foley catheter removed this morning.  Has not voided yet.  Positive flatus.  Wife at bediside.  He can wear home thigh high compression for edema control.   Objective:   VITALS:   Vitals:   02/17/24 1908 02/17/24 2129 02/18/24 0144 02/18/24 0547  BP: (!) 149/81 132/77 128/81 131/78  Pulse: (!) 57 (!) 59 (!) 57 (!) 53  Resp: 16 18 16 16   Temp: 98.1 F (36.7 C) 98.2 F (36.8 C) 98.3 F (36.8 C) 98.1 F (36.7 C)  TempSrc: Oral Oral Oral Oral  SpO2: 94% 95% 95% 95%  Weight:      Height:        NAD Neurologically intact ABD soft Neurovascular intact Sensation intact distally Intact pulses distally Dorsiflexion/Plantar flexion intact Incision: dressing C/D/I No cellulitis present Compartment soft   Lab Results  Component Value Date   WBC 8.8 02/18/2024   HGB 11.6 (L) 02/18/2024   HCT 36.1 (L) 02/18/2024   MCV 88.7 02/18/2024   PLT 164 02/18/2024   BMET    Component Value Date/Time   NA 138 02/18/2024 0327   NA 144 10/06/2023 0934   K 4.1 02/18/2024 0327   CL 105 02/18/2024 0327   CO2 22 02/18/2024 0327   GLUCOSE 307 (H) 02/18/2024 0327   BUN 16 02/18/2024 0327   BUN 19 10/06/2023 0934   CREATININE 0.83 02/18/2024 0327   CREATININE 0.99 03/11/2016 0853   CALCIUM 8.4 (L) 02/18/2024 0327   EGFR 98 10/06/2023 0934   GFRNONAA >60 02/18/2024 0327     Assessment/Plan: 1 Day Post-Op   Principal Problem:   S/P revision of total knee, right  Blood glucose 307, and 233, adjusted insulin  this morning. Goal glucose <200. He is baseline mounjaro , has not had 2 weeks. Continue to monitor.    WBAT with walker DVT ppx: Xarelto  and Foot Pumps, SCDs, TEDS PO pain control PT/OT: To come today.  Dispo:  - D/c home with OPPT once cleared with PT and voided.  - Will continue to monitor glucose control.    Geoffrey Robinson [name] 02/18/2024, 8:43 AM   [facility]  [place] [address], [place], [place] [address] Phone: [phone] [email or web address] Facebook  [employer]

## 2024-02-18 NOTE — Evaluation (Signed)
 Physical Therapy Evaluation Patient Details Name: Geoffrey Robinson MRN: [id number] DOB: [birth year] Today's Date: 02/18/2024  History of Present Illness  Pt s/p R TKR and with hx of DM, NICM and afib s/p ablation  Clinical Impression  Pt s/p R TKR and presents with decreased R LE strength/ROM and post op pain limiting functional mobility.  Pt should progress well to dc home with family assist and reports first OP PT scheduled for 12/22/23.        If plan is discharge home, recommend the following: A little help with walking and/or transfers;A little help with bathing/dressing/bathroom;Assistance with cooking/housework;Assist for transportation;Help with stairs or ramp for entrance   Can travel by private vehicle        Equipment Recommendations Rolling walker (2 wheels)  Recommendations for Other Services       Functional Status Assessment Patient has had a recent decline in their functional status and demonstrates the ability to make significant improvements in function in a reasonable and predictable amount of time.     Precautions / Restrictions Precautions Precautions: Knee;Fall Restrictions Weight Bearing Restrictions Per Provider Order: No RLE Weight Bearing Per Provider Order: Weight bearing as tolerated      Mobility  Bed Mobility Overal bed mobility: Needs Assistance Bed Mobility: Supine to Sit     Supine to sit: Supervision     General bed mobility comments: for safety; use of rail    Transfers Overall transfer level: Needs assistance Equipment used: Rolling walker (2 wheels) Transfers: Sit to/from Stand Sit to Stand: Contact guard assist           General transfer comment: Steady assist with cues for LE management and use of UEs to self assist    Ambulation/Gait Ambulation/Gait assistance: Min assist, Contact guard assist Gait Distance (Feet): 100 Feet Assistive device: Rolling walker (2 wheels) Gait Pattern/deviations: Step-to pattern,  Step-through pattern, Decreased step length - right, Decreased step length - left, Shuffle, Trunk flexed Gait velocity: decr     General Gait Details: cues for sequence, posture and position from [employer]            Wheelchair Mobility     Tilt Bed    Modified Rankin (Stroke Patients Only)       Balance Overall balance assessment: Mild deficits observed, not formally tested                                           Pertinent Vitals/Pain Pain Assessment Pain Assessment: 0-10 Pain Score: 5  Pain Location: R knee Pain Descriptors / Indicators: Aching, Sore Pain Intervention(s): Limited activity within patient's tolerance, Monitored during session, Premedicated before session, Ice applied    Home Living Family/patient expects to be discharged to:: Private residence Living Arrangements: Spouse/significant other Available Help at Discharge: Family;Available 24 hours/day Type of Home: House Home Access: Ramped entrance       Home Layout: One level Home Equipment: Cane - single point      Prior Function Prior Level of Function : Independent/Modified Independent                     Extremity/Trunk Assessment   Upper Extremity Assessment Upper Extremity Assessment: Overall WFL for tasks assessed    Lower Extremity Assessment Lower Extremity Assessment: RLE deficits/detail RLE Deficits / Details: AAROM R knee -5 - 85    Cervical /  Trunk Assessment Cervical / Trunk Assessment: Normal  Communication   Communication Communication: No apparent difficulties    Cognition Arousal: Alert Behavior During Therapy: WFL for tasks assessed/performed   PT - Cognitive impairments: No apparent impairments                         Following commands: Intact       Cueing Cueing Techniques: Verbal cues     General Comments      Exercises Total Joint Exercises Ankle Circles/Pumps: AROM, Both, 15 reps, Supine Quad Sets:  AROM, Both, 10 reps, Supine Heel Slides: AAROM, Right, 15 reps, Supine Straight Leg Raises: AAROM, AROM, Right, 10 reps, Supine   Assessment/Plan    PT Assessment Patient needs continued PT services  PT Problem List Decreased strength;Decreased range of motion;Decreased activity tolerance;Decreased balance;Decreased mobility;Decreased knowledge of use of DME;Pain;Obesity       PT Treatment Interventions DME instruction;Gait training;Functional mobility training;Therapeutic activities;Therapeutic exercise;Patient/family education    PT Goals (Current goals can be found in the Care Plan section)  Acute Rehab PT Goals Patient Stated Goal: Regain IND PT Goal Formulation: With patient Time For Goal Achievement: 02/24/24 Potential to Achieve Goals: Good    Frequency 7X/week     Co-evaluation               AM-PAC PT 6 Clicks Mobility  Outcome Measure Help needed turning from your back to your side while in a flat bed without using bedrails?: A Little Help needed moving from lying on your back to sitting on the side of a flat bed without using bedrails?: A Little Help needed moving to and from a bed to a chair (including a wheelchair)?: A Little Help needed standing up from a chair using your arms (e.g., wheelchair or bedside chair)?: A Little Help needed to walk in hospital room?: A Little Help needed climbing 3-5 steps with a railing? : A Lot 6 Click Score: 17    End of Session Equipment Utilized During Treatment: Gait belt Activity Tolerance: Patient tolerated treatment well Patient left: in chair;with call bell/phone within reach;with family/visitor present Nurse Communication: Mobility status PT Visit Diagnosis: Unsteadiness on feet (R26.81);Difficulty in walking, not elsewhere classified (R26.2)    Time: 9144-9069 PT Time Calculation (min) (ACUTE ONLY): 35 min   Charges:   PT Evaluation $PT Eval Low Complexity: 1 Low PT Treatments $Therapeutic Exercise: 8-22  mins PT General Charges $$ ACUTE PT VISIT: 1 Visit         [facility] PT Acute Rehabilitation Services Office [phone]   Hridhaan Yohn 02/18/2024, 10:23 AM

## 2024-02-18 NOTE — Progress Notes (Signed)
Discharge medications delivered to patient at bedside.

## 2024-02-18 NOTE — Progress Notes (Signed)
 Physical Therapy Treatment Patient Details Name: Geoffrey Robinson MRN: [id number] DOB: July 05, 1960 Today's Date: 02/18/2024   History of Present Illness Pt s/p R TKR and with hx of DM, NICM and afib s/p ablation    PT Comments  Pt motivated and progressing well with mobility.  Pt eager for dc home this date.    If plan is discharge home, recommend the following: A little help with walking and/or transfers;A little help with bathing/dressing/bathroom;Assistance with cooking/housework;Assist for transportation;Help with stairs or ramp for entrance   Can travel by private vehicle        Equipment Recommendations  Rolling walker (2 wheels)    Recommendations for Other Services       Precautions / Restrictions Precautions Precautions: Knee;Fall Restrictions Weight Bearing Restrictions Per Provider Order: No RLE Weight Bearing Per Provider Order: Weight bearing as tolerated     Mobility  Bed Mobility Overal bed mobility: Needs Assistance Bed Mobility: Supine to Sit     Supine to sit: Supervision     General bed mobility comments: Up in chair and returns to same    Transfers Overall transfer level: Needs assistance Equipment used: Rolling walker (2 wheels) Transfers: Sit to/from Stand Sit to Stand: Contact guard assist, Supervision           General transfer comment: cues for LE management and use of UEs to self assist    Ambulation/Gait Ambulation/Gait assistance: Contact guard assist, Supervision Gait Distance (Feet): 120 Feet Assistive device: Rolling walker (2 wheels) Gait Pattern/deviations: Step-to pattern, Step-through pattern, Decreased step length - right, Decreased step length - left, Shuffle, Trunk flexed Gait velocity: decr     General Gait Details: cues for sequence, posture and position from [employer]             Wheelchair Mobility     Tilt Bed    Modified Rankin (Stroke Patients Only)       Balance Overall balance  assessment: Mild deficits observed, not formally tested                                          Communication Communication Communication: No apparent difficulties  Cognition Arousal: Alert Behavior During Therapy: WFL for tasks assessed/performed   PT - Cognitive impairments: No apparent impairments                         Following commands: Intact      Cueing Cueing Techniques: Verbal cues  Exercises Total Joint Exercises Ankle Circles/Pumps: AROM, Both, 15 reps, Supine Quad Sets: AROM, Both, 10 reps, Supine Heel Slides: AAROM, Right, 15 reps, Supine Straight Leg Raises: AAROM, AROM, Right, 10 reps, Supine    General Comments        Pertinent Vitals/Pain Pain Assessment Pain Assessment: 0-10 Pain Score: 5  Pain Location: R knee Pain Descriptors / Indicators: Aching, Sore Pain Intervention(s): Limited activity within patient's tolerance, Monitored during session, Premedicated before session, Ice applied    Home Living                          Prior Function            PT Goals (current goals can now be found in the care plan section) Acute Rehab PT Goals Patient Stated Goal: Regain IND PT Goal Formulation: With  patient Time For Goal Achievement: 02/24/24 Potential to Achieve Goals: Good Progress towards PT goals: Progressing toward goals    Frequency    7X/week      PT Plan      Co-evaluation              AM-PAC PT 6 Clicks Mobility   Outcome Measure  Help needed turning from your back to your side while in a flat bed without using bedrails?: A Little Help needed moving from lying on your back to sitting on the side of a flat bed without using bedrails?: A Little Help needed moving to and from a bed to a chair (including a wheelchair)?: A Little Help needed standing up from a chair using your arms (e.g., wheelchair or bedside chair)?: A Little Help needed to walk in hospital room?: A Little Help  needed climbing 3-5 steps with a railing? : A Little 6 Click Score: 18    End of Session Equipment Utilized During Treatment: Gait belt Activity Tolerance: Patient tolerated treatment well Patient left: in chair;with call bell/phone within reach;with family/visitor present Nurse Communication: Mobility status PT Visit Diagnosis: Unsteadiness on feet (R26.81);Difficulty in walking, not elsewhere classified (R26.2)     Time: 8858-8798 PT Time Calculation (min) (ACUTE ONLY): 20 min  Charges:    $Gait Training: 8-22 mins PT General Charges $$ ACUTE PT VISIT: 1 Visit                     [facility] PT Acute Rehabilitation Services Office [phone]    Brookie Wayment 02/18/2024, 2:25 PM

## 2024-02-18 NOTE — TOC Transition Note (Addendum)
 Transition of Care [facility]) - Discharge Note   Patient Details  Name: RIKKI SMESTAD MRN: [id number] Date of Birth: [birth year]  Transition of Care [facility]) CM/SW Contact:  Alfonse JONELLE Rex, RN Phone Number: 02/18/2024, 9:13 AM   Clinical Narrative: s/p Right TKA,  dc  therapy EO- [place], [employer] delivered RW to bedside. No TOC needs.    Final next level of care: OP Rehab Barriers to Discharge: No Barriers Identified   Patient Goals and CMS Choice Patient states their goals for this hospitalization and ongoing recovery are:: return home          Discharge Placement                       Discharge Plan and Services Additional resources added to the After Visit Summary for                                       Social Drivers of Health (SDOH) Interventions SDOH Screenings   Food Insecurity: No Food Insecurity (02/17/2024)  Housing: Low Risk  (02/17/2024)  Transportation Needs: No Transportation Needs (02/17/2024)  Utilities: Not At Risk (02/17/2024)  Depression (PHQ2-9): Low Risk  (12/29/2023)  Financial Resource Strain: Low Risk  (12/26/2023)  Physical Activity: Insufficiently Active (12/26/2023)  Social Connections: Moderately Integrated (12/26/2023)  Stress: No Stress Concern Present (12/26/2023)  Tobacco Use: Low Risk  (02/17/2024)     Readmission Risk Interventions     No data to display

## 2024-02-18 NOTE — Progress Notes (Signed)
 Discharge instructions were done by Cami RN, and all questions were answered. Patient was stable for discharge and was taken to the main exit by wheelchair.

## 2024-02-19 NOTE — Discharge Summary (Signed)
 Physician Discharge Summary  Patient ID: Geoffrey Robinson MRN: [id number] DOB/AGE: 1961-01-07 63 y.o.  Admit date: 02/17/2024 Discharge date: 02/18/2024  Admission Diagnoses:  S/P total knee arthroplasty, right  Discharge Diagnoses:  Principal Problem:   S/P total knee arthroplasty, right   Past Medical History:  Diagnosis Date   Arthritis    Ascending aorta dilatation (HCC)    41mm by CT>>normal when indexed for BSA   Back pain    Bradycardia 10/21/2015   Diabetes mellitus without complication (HCC)    Dyslipidemia    Heart burn    High cholesterol    HTN (hypertension)    Joint pain    NICM (nonischemic cardiomyopathy) (HCC)    a. LHC 2002: no CAD, EF 35-40%; b. Echo 2003: EF 60% - tachycardia mediated (AFlutter with RVR).  EF 50% on echo 11/2020   Obesity    Palpitation    Persistent atrial fibrillation (HCC)    CHADS2VASC score is 2.  s/p remote afib and aflutter ablations   Severe obstructive sleep apnea    , CPAP at 14cm H2O   Swelling of lower extremity    Varicose veins     Surgeries: Procedure(s): ARTHROPLASTY, KNEE, TOTAL, USING IMAGELESS COMPUTER-ASSISTED NAVIGATION on 02/17/2024   Consultants (if any):   Discharged Condition: Improved  Hospital Course: Geoffrey Robinson is an 63 y.o. male who was admitted 02/17/2024 with a diagnosis of S/P total knee arthroplasty, right and went to the operating room on 02/17/2024 and underwent the above named procedures.    He was given perioperative antibiotics:  Anti-infectives (From admission, onward)    Start     Dose/Rate Route Frequency Ordered Stop   02/17/24 1800  ceFAZolin  (ANCEF ) IVPB 2g/100 mL premix        2 g 200 mL/hr over 30 Minutes Intravenous Every 6 hours 02/17/24 1608 02/17/24 2355   02/17/24 1425  vancomycin  (VANCOCIN ) powder  Status:  Discontinued          As needed 02/17/24 1425 02/17/24 1520   02/17/24 1000  ceFAZolin  (ANCEF ) IVPB 3g/150 mL premix        3 g 300 mL/hr over 30 Minutes  Intravenous On call to O.R. 02/17/24 9177 02/17/24 1149       He was given sequential compression devices, early ambulation, and xarelto  for DVT prophylaxis.  POD1 He ambulated 100 and 120 feet with PT. D/c home with OPPT. Follow-up in office in 2 weeks for repeat evaluation.   He benefited maximally from the hospital stay and there were no complications.    Recent vital signs:  Vitals:   02/18/24 0547 02/18/24 0943  BP: 131/78 (!) 148/77  Pulse: (!) 53 (!) 52  Resp: 16 18  Temp: 98.1 F (36.7 C) 97.8 F (36.6 C)  SpO2: 95% 99%    Recent laboratory studies:  Lab Results  Component Value Date   HGB 11.6 (L) 02/18/2024   HGB 13.0 02/11/2024   HGB 13.6 05/11/2023   Lab Results  Component Value Date   WBC 8.8 02/18/2024   PLT 164 02/18/2024   Lab Results  Component Value Date   INR 2.2 07/06/2013   Lab Results  Component Value Date   NA 138 02/18/2024   K 4.1 02/18/2024   CL 105 02/18/2024   CO2 22 02/18/2024   BUN 16 02/18/2024   CREATININE 0.83 02/18/2024   GLUCOSE 307 (H) 02/18/2024     Allergies as of 02/18/2024  Reactions   Plavix [clopidogrel Bisulfate] Rash        Medication List     TAKE these medications    Acetaminophen  Extra Strength 500 MG Tabs Take 2 tablets (1,000 mg total) by mouth every 8 (eight) hours as needed for moderate pain (pain score 4-6). What changed: when to take this   Betasept  Surgical Scrub 4 % external liquid Generic drug: chlorhexidine  Apply 15 mLs (1 Application total) topically as directed for 30 doses. Use as directed daily for 5 days every other week for 6 weeks.   digoxin  0.125 MG tablet Commonly known as: LANOXIN  Take 1 tablet (0.125 mg total) by mouth daily.   diltiazem  360 MG 24 hr capsule Commonly known as: CARDIZEM  CD TAKE 1 CAPSULE BY MOUTH EVERY DAY   docusate sodium  100 MG capsule Commonly known as: Colace Take 1 capsule (100 mg total) by mouth 2 (two) times daily.   fenofibrate  145 MG  tablet Commonly known as: TRICOR  Take 1 tablet (145 mg total) by mouth daily.   finasteride  5 MG tablet Commonly known as: PROSCAR  Take 5 mg by mouth daily.   furosemide  20 MG tablet Commonly known as: LASIX  TAKE 1 TABLET AS NEEDED FOR EXCESS SWELLING/WEIGHT GAIN 2-3LBS PER DAY OR 5LBS PER WEEK   lisinopril  40 MG tablet Commonly known as: ZESTRIL  Take 1 tablet (40 mg total) by mouth daily.   methimazole  5 MG tablet Commonly known as: TAPAZOLE  Take 1 tablet (5 mg total) by mouth as directed. 1 tablet Monday through Friday and none Saturdays or sundays   methocarbamol  500 MG tablet Commonly known as: ROBAXIN  Take 1 tablet (500 mg total) by mouth every 6 (six) hours as needed for muscle spasms.   metoprolol  tartrate 100 MG tablet Commonly known as: LOPRESSOR  TAKE 1 TABLET BY MOUTH EVERY MORNING AND 1&1/2 TABLETS EVERY EVENING What changed: See the new instructions.   mupirocin  ointment 2 % Commonly known as: BACTROBAN  Place 1 Application into the nose 2 (two) times daily for 60 doses. Use as directed 2 times daily for 5 days every other week for 6 weeks.   omeprazole  40 MG capsule Commonly known as: PRILOSEC Take 1 capsule (40 mg total) by mouth daily.   ondansetron  4 MG tablet Commonly known as: Zofran  Take 1 tablet (4 mg total) by mouth every 8 (eight) hours as needed for nausea or vomiting.   oxyCODONE  5 MG immediate release tablet Commonly known as: Roxicodone  Take 1 tablet (5 mg total) by mouth every 4 (four) hours as needed for severe pain (pain score 7-10).   polyethylene glycol powder 17 GM/SCOOP powder Commonly known as: GLYCOLAX /MIRALAX  Take 17 g dissolved in liquid by mouth daily as needed for mild constipation or moderate constipation.   senna 8.6 MG Tabs tablet Commonly known as: SENOKOT Take 2 tablets (17.2 mg total) by mouth at bedtime for 15 days.   tamsulosin  0.4 MG Caps capsule Commonly known as: FLOMAX  Take 0.4 mg by mouth at bedtime.    tirzepatide  7.5 MG/0.5ML Pen Commonly known as: MOUNJARO  Inject 7.5 mg into the skin once a week.   Vitamin D  (Ergocalciferol ) 1.25 MG (50000 UNIT) Caps capsule Commonly known as: DRISDOL  Take 1 capsule (50,000 Units total) by mouth every 7 (seven) days.   Xarelto  20 MG Tabs tablet Generic drug: rivaroxaban  TAKE ONE TABLET BY MOUTH ONE TIME DAILY WITH SUPPER               Discharge Care Instructions  (From admission, onward)  Start     Ordered   02/18/24 0000  Weight bearing as tolerated        02/18/24 0855   02/18/24 0000  Change dressing       Comments: Do not remove your dressing.   02/18/24 0855              WEIGHT BEARING   Weight bearing as tolerated with assist device (walker, cane, etc) as directed, use it as long as suggested by your surgeon or therapist, typically at least 4-6 weeks.   EXERCISES  Results after joint replacement surgery are often greatly improved when you follow the exercise, range of motion and muscle strengthening exercises prescribed by your doctor. Safety measures are also important to protect the joint from further injury. Any time any of these exercises cause you to have increased pain or swelling, decrease what you are doing until you are comfortable again and then slowly increase them. If you have problems or questions, call your caregiver or physical therapist for advice.   Rehabilitation is important following a joint replacement. After just a few days of immobilization, the muscles of the leg can become weakened and shrink (atrophy).  These exercises are designed to build up the tone and strength of the thigh and leg muscles and to improve motion. Often times heat used for twenty to thirty minutes before working out will loosen up your tissues and help with improving the range of motion but do not use heat for the first two weeks following surgery (sometimes heat can increase post-operative swelling).   These  exercises can be done on a training (exercise) mat, on the floor, on a table or on a bed. Use whatever works the best and is most comfortable for you.    Use music or television while you are exercising so that the exercises are a pleasant break in your day. This will make your life better with the exercises acting as a break in your routine that you can look forward to.   Perform all exercises about fifteen times, three times per day or as directed.  You should exercise both the operative leg and the other leg as well.  Exercises include:   Quad Sets - Tighten up the muscle on the front of the thigh (Quad) and hold for 5-10 seconds.   Straight Leg Raises - With your knee straight (if you were given a brace, keep it on), lift the leg to 60 degrees, hold for 3 seconds, and slowly lower the leg.  Perform this exercise against resistance later as your leg gets stronger.  Leg Slides: Lying on your back, slowly slide your foot toward your buttocks, bending your knee up off the floor (only go as far as is comfortable). Then slowly slide your foot back down until your leg is flat on the floor again.  Angel Wings: Lying on your back spread your legs to the side as far apart as you can without causing discomfort.  Hamstring Strength:  Lying on your back, push your heel against the floor with your leg straight by tightening up the muscles of your buttocks.  Repeat, but this time bend your knee to a comfortable angle, and push your heel against the floor.  You may put a pillow under the heel to make it more comfortable if necessary.   A rehabilitation program following joint replacement surgery can speed recovery and prevent re-injury in the future due to weakened muscles. Contact your doctor or a physical  therapist for more information on knee rehabilitation.    CONSTIPATION  Constipation is defined medically as fewer than three stools per week and severe constipation as less than one stool per week.  Even if  you have a regular bowel pattern at home, your normal regimen is likely to be disrupted due to multiple reasons following surgery.  Combination of anesthesia, postoperative narcotics, change in appetite and fluid intake all can affect your bowels.   YOU MUST use at least one of the following options; they are listed in order of increasing strength to get the job done.  They are all available over the counter, and you may need to use some, POSSIBLY even all of these options:    Drink plenty of fluids (prune juice may be helpful) and high fiber foods Colace 100 mg by mouth twice a day  Senokot for constipation as directed and as needed Dulcolax (bisacodyl ), take with full glass of water   Miralax  (polyethylene glycol) once or twice a day as needed.  If you have tried all these things and are unable to have a bowel movement in the first 3-4 days after surgery call either your surgeon or your primary doctor.    If you experience loose stools or diarrhea, hold the medications until you stool forms back up.  If your symptoms do not get better within 1 week or if they get worse, check with your doctor.  If you experience the worst abdominal pain ever or develop nausea or vomiting, please contact the office immediately for further recommendations for treatment.   ITCHING:  If you experience itching with your medications, try taking only a single pain pill, or even half a pain pill at a time.  You can also use Benadryl  over the counter for itching or also to help with sleep.   TED HOSE STOCKINGS:  Use stockings on both legs until for at least 2 weeks or as directed by physician office. They may be removed at night for sleeping.  MEDICATIONS:  See your medication summary on the "After Visit Summary" that nursing will review with you.  You may have some home medications which will be placed on hold until you complete the course of blood thinner medication.  It is important for you to complete the blood  thinner medication as prescribed.  PRECAUTIONS:  If you experience chest pain or shortness of breath - call 911 immediately for transfer to the hospital emergency department.   If you develop a fever greater that 101 F, purulent drainage from wound, increased redness or drainage from wound, foul odor from the wound/dressing, or calf pain - CONTACT YOUR SURGEON.                                                   FOLLOW-UP APPOINTMENTS:  If you do not already have a post-op appointment, please call the office for an appointment to be seen by your surgeon.  Guidelines for how soon to be seen are listed in your "After Visit Summary", but are typically between 1-4 weeks after surgery.  OTHER INSTRUCTIONS:   Knee Replacement:  Do not place pillow under knee, focus on keeping the knee straight while resting. CPM instructions: 0-90 degrees, 2 hours in the morning, 2 hours in the afternoon, and 2 hours in the evening. Place foam block, curve side up  under heel at all times except when in CPM or when walking.  DO NOT modify, tear, cut, or change the foam block in any way.   MAKE SURE YOU:  Understand these instructions.  Get help right away if you are not doing well or get worse.    Thank you for letting us  be a part of your medical care team.  It is a privilege we respect greatly.  We hope these instructions will help you stay on track for a fast and full recovery!   Diagnostic Studies: DG Knee Right Port Result Date: 02/17/2024 CLINICAL DATA:  Status post knee arthroplasty EXAM: PORTABLE RIGHT KNEE - 1-2 VIEW COMPARISON:  None Available. FINDINGS: Midline cutaneous staples. Status post right knee replacement with normal alignment and intact hardware. No fracture. Moderate gas in the soft tissues consistent with recent surgery. IMPRESSION: Status post right knee replacement with expected postsurgical change. Electronically Signed   By: Luke Bun M.D.   On: 02/17/2024 15:39    Disposition: Discharge  disposition: 01-Home or Self Care       Discharge Instructions     Call MD / Call 911   Complete by: As directed    If you experience chest pain or shortness of breath, CALL 911 and be transported to the hospital emergency room.  If you develope a fever above 101 F, pus (white drainage) or increased drainage or redness at the wound, or calf pain, call your surgeon's office.   Change dressing   Complete by: As directed    Do not remove your dressing.   Constipation Prevention   Complete by: As directed    Drink plenty of fluids.  Prune juice may be helpful.  You may use a stool softener, such as Colace (over the counter) 100 mg twice a day.  Use MiraLax  (over the counter) for constipation as needed.   Diet - low sodium heart healthy   Complete by: As directed    Diet Carb Modified   Complete by: As directed    Discharge instructions   Complete by: As directed    Elevate toes above nose. Use cryotherapy as needed for pain and swelling.  Wear home compression stockings for edema control.  Monitor blood glucose with goal <200 for optimal healing.   Do not put a pillow under the knee. Place it under the heel.   Complete by: As directed    Driving restrictions   Complete by: As directed    No driving for 6 weeks   Increase activity slowly as tolerated   Complete by: As directed    Lifting restrictions   Complete by: As directed    No lifting for 6 weeks   Post-operative opioid taper instructions:   Complete by: As directed    POST-OPERATIVE OPIOID TAPER INSTRUCTIONS: It is important to wean off of your opioid medication as soon as possible. If you do not need pain medication after your surgery it is ok to stop day one. Opioids include: Codeine, Hydrocodone(Norco, Vicodin), Oxycodone (Percocet, oxycontin ) and hydromorphone  amongst others.  Long term and even short term use of opiods can cause: Increased pain response Dependence Constipation Depression Respiratory  depression And more.  Withdrawal symptoms can include Flu like symptoms Nausea, vomiting And more Techniques to manage these symptoms Hydrate well Eat regular healthy meals Stay active Use relaxation techniques(deep breathing, meditating, yoga) Do Not substitute Alcohol  to help with tapering If you have been on opioids for less than two weeks and do  not have pain than it is ok to stop all together.  Plan to wean off of opioids This plan should start within one week post op of your joint replacement. Maintain the same interval or time between taking each dose and first decrease the dose.  Cut the total daily intake of opioids by one tablet each day Next start to increase the time between doses. The last dose that should be eliminated is the evening dose.      TED hose   Complete by: As directed    Use home compression stockings for swelling control.   Weight bearing as tolerated   Complete by: As directed         Follow-up Information     Leigh Valery RAMAN, PA-C. Schedule an appointment as soon as possible for a visit in 2 week(s).   Specialty: Orthopedic Surgery Why: For suture removal, For wound re-check Contact information: [address], [address] [place] [address] [phone]                  Signed: Valery RAMAN Leigh 02/19/2024, 5:24 PM

## 2024-02-22 ENCOUNTER — Encounter: Payer: Self-pay | Admitting: Nurse Practitioner

## 2024-02-22 ENCOUNTER — Encounter (HOSPITAL_COMMUNITY): Payer: Self-pay | Admitting: Orthopedic Surgery

## 2024-02-29 ENCOUNTER — Telehealth: Payer: Self-pay | Admitting: Nurse Practitioner

## 2024-02-29 DIAGNOSIS — E1169 Type 2 diabetes mellitus with other specified complication: Secondary | ICD-10-CM

## 2024-02-29 MED ORDER — TIRZEPATIDE 7.5 MG/0.5ML ~~LOC~~ SOAJ: 7.5000 mg | SUBCUTANEOUS | 0 refills | Status: DC

## 2024-02-29 NOTE — Telephone Encounter (Signed)
 Patient stated he needs a refill of Mounjaro . Patient just had knee surgery.

## 2024-03-06 ENCOUNTER — Other Ambulatory Visit

## 2024-03-07 ENCOUNTER — Other Ambulatory Visit

## 2024-03-07 LAB — T3, FREE: T3, Free: 3.4 pg/mL (ref 2.3–4.2)

## 2024-03-07 LAB — T4, FREE: Free T4: 1.5 ng/dL (ref 0.8–1.8)

## 2024-03-07 LAB — TSH: TSH: 0.68 m[IU]/L (ref 0.40–4.50)

## 2024-03-16 ENCOUNTER — Ambulatory Visit: Admitting: Nurse Practitioner

## 2024-03-22 ENCOUNTER — Ambulatory Visit: Admitting: Nurse Practitioner

## 2024-03-22 ENCOUNTER — Encounter: Payer: Self-pay | Admitting: Nurse Practitioner

## 2024-03-22 VITALS — BP 121/69 | HR 58 | Temp 97.8°F | Ht 72.0 in | Wt 277.0 lb

## 2024-03-22 DIAGNOSIS — E0590 Thyrotoxicosis, unspecified without thyrotoxic crisis or storm: Secondary | ICD-10-CM | POA: Diagnosis not present

## 2024-03-22 DIAGNOSIS — Z7985 Long-term (current) use of injectable non-insulin antidiabetic drugs: Secondary | ICD-10-CM

## 2024-03-22 DIAGNOSIS — E66812 Obesity, class 2: Secondary | ICD-10-CM

## 2024-03-22 DIAGNOSIS — E669 Obesity, unspecified: Secondary | ICD-10-CM

## 2024-03-22 DIAGNOSIS — E119 Type 2 diabetes mellitus without complications: Secondary | ICD-10-CM

## 2024-03-22 DIAGNOSIS — Z6837 Body mass index (BMI) 37.0-37.9, adult: Secondary | ICD-10-CM

## 2024-03-22 DIAGNOSIS — G4733 Obstructive sleep apnea (adult) (pediatric): Secondary | ICD-10-CM

## 2024-03-22 MED ORDER — TIRZEPATIDE 7.5 MG/0.5ML ~~LOC~~ SOAJ: 7.5000 mg | SUBCUTANEOUS | 0 refills | Status: DC

## 2024-03-22 NOTE — Progress Notes (Signed)
 Office: [phone]  /  Fax: [phone]  WEIGHT SUMMARY AND BIOMETRICS  Weight Lost Since Last Visit: 8lb  Weight Gained Since Last Visit: 0lb   Vitals Temp: 97.8 F (36.6 C) BP: 121/69 Pulse Rate: (!) 58 SpO2: 97 %   Anthropometric Measurements Height: 6' (1.829 m) Weight: 277 lb (125.6 kg) BMI (Calculated): 37.56 Weight at Last Visit: 285lb Weight Lost Since Last Visit: 8lb Weight Gained Since Last Visit: 0lb Starting Weight: 339lb Total Weight Loss (lbs): 62 lb (28.1 kg)   Body Composition  Body Fat %: 40.6 % Fat Mass (lbs): 112.4 lbs Muscle Mass (lbs): 156.4 lbs Total Body Water  (lbs): 135.2 lbs Visceral Fat Rating : 24   Other Clinical Data Fasting: Yes Labs: No Today's Visit #: 26 Starting Date: 01/14/22     HPI  Chief Complaint: OBESITY  Geoffrey Robinson is here to discuss his progress with his obesity treatment plan. He is on the the Category 4 Plan and states he is following his eating plan approximately 50 % of the time. He states he is exercising 15-20 minutes 5 days per week.   Interval History:  Since last office visit he has lost 8 pounds. He is not skipping meals.  He is eating a protein with each meal.  He is snacking on cheese.  He is drinking water  and a diet soda daily.  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 He is going to PT 2 days per week and is walking 3 days per week.  He is considering having left knee arthroplasty in Nov or Dec.    His highest weight was 392lbs.   Pharmacotherapy for weight loss: He is not currently taking medications for medical weight loss.     Previous pharmacotherapy for medical weight loss:  None   Bariatric surgery:  Patient has not had bariatric surgery.  Pharmacotherapy for DMT2:   He is currently taking Mounjaro  7.5mg .  Denies side effects.   Last A1c was 6.7 CBGs:   140s-200 (after surgery).  Episodes of hypoglycemia: Denies On Lisinopril  40mg .   Last eye exam:  8/24-had to reschedule  to  November He doesn't want to take Metformin   Lab Results  Component Value Date   HGBA1C 6.7 (H) 02/11/2024   HGBA1C 6.7 (H) 10/06/2023   HGBA1C 6.7 (H) 06/02/2023   Lab Results  Component Value Date   LDLCALC 101 (H) 10/06/2023   CREATININE 0.83 02/18/2024    Obstructive Sleep Apnea [place] has a diagnosis of sleep apnea. He reports that he is using a CPAP regularly.    Hyperthyroidism Saw endo last on 01/18/24 and has a follow up appt scheduled on 04/21/24  Last TSH was 0.68, free T4 was 1.5 and free T3 was 3.4 on 03/06/24   -Thyroid  ultrasound revealed multinodular goiter in February, 2025   -Biopsy May 2nd-benign thyroid  FNA results.    -Cardiology stopped his amio and started him on digoxin  0.125mg  (started Dec 1st).   -Has discussed labs with cardiology   -denies palpitations, tachycardia and chest pain   -denies FH of thyroid  cancer   -he was started on Methimazoke 5mg  Mon-Friday.          PHYSICAL EXAM:  Blood pressure 121/69, pulse (!) 58, temperature 97.8 F (36.6 C), height 6' (1.829 m), weight 277 lb (125.6 kg), SpO2 97%. Body mass index is 37.57 kg/m.  General: He is overweight, cooperative, alert, well developed, and in no acute distress. PSYCH: Has normal mood, affect and thought  process.   Extremities: No edema.  Neurologic: No gross sensory or motor deficits. No tremors or fasciculations noted.    DIAGNOSTIC DATA REVIEWED:  BMET    Component Value Date/Time   NA 138 02/18/2024 0327   NA 144 10/06/2023 0934   K 4.1 02/18/2024 0327   CL 105 02/18/2024 0327   CO2 22 02/18/2024 0327   GLUCOSE 307 (H) 02/18/2024 0327   BUN 16 02/18/2024 0327   BUN 19 10/06/2023 0934   CREATININE 0.83 02/18/2024 0327   CREATININE 0.99 03/11/2016 0853   CALCIUM 8.4 (L) 02/18/2024 0327   GFRNONAA >60 02/18/2024 0327   GFRAA >60 08/15/2019 0820   Lab Results  Component Value Date   HGBA1C 6.7 (H) 02/11/2024   HGBA1C  04/10/2009    5.9 (NOTE) The ADA  recommends the following therapeutic goal for glycemic control related to Hgb A1c measurement: Goal of therapy: <6.5 Hgb A1c  Reference: American Diabetes Association: Clinical Practice Recommendations 2010, Diabetes Care, 2010, 33: (Suppl  1).   Lab Results  Component Value Date   INSULIN  30.0 (H) 06/30/2022   INSULIN  27.1 (H) 01/14/2022   Lab Results  Component Value Date   TSH 0.68 03/06/2024   CBC    Component Value Date/Time   WBC 8.8 02/18/2024 0327   RBC 4.07 (L) 02/18/2024 0327   HGB 11.6 (L) 02/18/2024 0327   HGB 13.6 05/11/2023 0848   HCT 36.1 (L) 02/18/2024 0327   HCT 41.7 05/11/2023 0848   PLT 164 02/18/2024 0327   PLT 224 05/11/2023 0848   MCV 88.7 02/18/2024 0327   MCV 82 05/11/2023 0848   MCH 28.5 02/18/2024 0327   MCHC 32.1 02/18/2024 0327   RDW 13.6 02/18/2024 0327   RDW 15.2 05/11/2023 0848   Iron Studies No results found for: IRON, TIBC, FERRITIN, IRONPCTSAT Lipid Panel     Component Value Date/Time   CHOL 161 10/06/2023 0934   TRIG 174 (H) 10/06/2023 0934   HDL 29 (L) 10/06/2023 0934   CHOLHDL 4.6 04/11/2009 0451   VLDL 20 04/11/2009 0451   LDLCALC 101 (H) 10/06/2023 0934   Hepatic Function Panel     Component Value Date/Time   PROT 7.1 10/06/2023 0934   ALBUMIN 4.3 10/06/2023 0934   AST 21 10/06/2023 0934   ALT 26 10/06/2023 0934   ALKPHOS 87 10/06/2023 0934   BILITOT 0.4 10/06/2023 0934      Component Value Date/Time   TSH 0.68 03/06/2024 0930   Nutritional Lab Results  Component Value Date   VD25OH 36.6 10/06/2023   VD25OH 40.3 06/02/2023   VD25OH 43.1 02/24/2023     ASSESSMENT AND PLAN  TREATMENT PLAN FOR OBESITY:  Recommended Dietary Goals  Malosi is currently in the action stage of change. As such, his goal is to continue weight management plan. He has agreed to practicing portion control and making smarter food choices, such as increasing vegetables and decreasing simple carbohydrates.  Behavioral  Intervention  We discussed the following Behavioral Modification Strategies today: increasing lean protein intake to established goals, decreasing simple carbohydrates , increasing vegetables, increasing fiber rich foods, increasing water  intake , work on meal planning and preparation, reading food labels , keeping healthy foods at home, continue to work on maintaining a reduced calorie state, getting the recommended amount of protein, incorporating whole foods, making healthy choices, staying well hydrated and practicing mindfulness when eating., and increase protein intake, fibrous foods (25 grams per day for women, 30 grams for men)  and water  to improve satiety and decrease hunger signals. .  Additional resources provided today: NA  Recommended Physical Activity Goals  Cline has been advised to exercise based upon surgeon and PT's recommendations.     ASSOCIATED CONDITIONS ADDRESSED TODAY  Action/Plan  Type 2 diabetes mellitus in patient with obesity (HCC) -     Doing well.  Continue Tirzepatide ; Inject 7.5 mg into the skin once a week.  Dispense: 2 mL; Refill: 0.  Side effects discussed.   Hyperthyroidism Keep follow up appt with endo and take meds as directed  Severe obstructive sleep apnea Continue CPAP nightly.   Obesity, Class II, BMI 35-39.9      Will recheck labs in November   Return in about 4 weeks (around 04/19/2024).SABRA He was informed of the importance of frequent follow up visits to maximize his success with intensive lifestyle modifications for his multiple health conditions.   ATTESTASTION STATEMENTS:  Reviewed by clinician on day of visit: allergies, medications, problem list, medical history, surgical history, family history, social history, and previous encounter notes.     Corean SAUNDERS. Aydee Mcnew FNP-C

## 2024-04-10 ENCOUNTER — Encounter: Payer: Self-pay | Admitting: Internal Medicine

## 2024-04-10 ENCOUNTER — Telehealth: Payer: Self-pay | Admitting: *Deleted

## 2024-04-10 NOTE — Telephone Encounter (Signed)
Answered in Mychart message

## 2024-04-10 NOTE — Telephone Encounter (Signed)
 Copied from CRM #[id number]. Topic: Clinical - Medical Advice >> Apr 07, 2024  3:27 PM [place] A wrote: Reason for CRM: Patient is calling to see what does he need to do if he wants to get his left knee joint replaced? Please contact

## 2024-04-14 ENCOUNTER — Other Ambulatory Visit: Payer: Self-pay | Admitting: Internal Medicine

## 2024-04-19 ENCOUNTER — Encounter: Payer: Self-pay | Admitting: Nurse Practitioner

## 2024-04-19 ENCOUNTER — Ambulatory Visit: Admitting: Nurse Practitioner

## 2024-04-19 VITALS — BP 119/72 | HR 70 | Temp 97.7°F | Ht 72.0 in | Wt 274.0 lb

## 2024-04-19 DIAGNOSIS — E119 Type 2 diabetes mellitus without complications: Secondary | ICD-10-CM | POA: Diagnosis not present

## 2024-04-19 DIAGNOSIS — Z6837 Body mass index (BMI) 37.0-37.9, adult: Secondary | ICD-10-CM

## 2024-04-19 DIAGNOSIS — E0590 Thyrotoxicosis, unspecified without thyrotoxic crisis or storm: Secondary | ICD-10-CM

## 2024-04-19 DIAGNOSIS — Z7985 Long-term (current) use of injectable non-insulin antidiabetic drugs: Secondary | ICD-10-CM

## 2024-04-19 DIAGNOSIS — E66812 Obesity, class 2: Secondary | ICD-10-CM

## 2024-04-19 DIAGNOSIS — E559 Vitamin D deficiency, unspecified: Secondary | ICD-10-CM | POA: Diagnosis not present

## 2024-04-19 DIAGNOSIS — E669 Obesity, unspecified: Secondary | ICD-10-CM

## 2024-04-19 MED ORDER — VITAMIN D (ERGOCALCIFEROL) 1.25 MG (50000 UNIT) PO CAPS: 50000.0000 [IU] | ORAL_CAPSULE | ORAL | 0 refills | Status: AC

## 2024-04-19 MED ORDER — TIRZEPATIDE 7.5 MG/0.5ML ~~LOC~~ SOAJ: 7.5000 mg | SUBCUTANEOUS | 0 refills | Status: DC

## 2024-04-19 NOTE — Progress Notes (Signed)
 Office: [phone]  /  Fax: [phone]  WEIGHT SUMMARY AND BIOMETRICS  Weight Lost Since Last Visit: 3lb  Weight Gained Since Last Visit: 0lb   Vitals Temp: 97.7 F (36.5 C) BP: 119/72 Pulse Rate: 70 SpO2: 99 %   Anthropometric Measurements Height: 6' (1.829 m) Weight: 274 lb (124.3 kg) BMI (Calculated): 37.15 Weight at Last Visit: 277lb Weight Lost Since Last Visit: 3lb Weight Gained Since Last Visit: 0lb Starting Weight: 339lb Total Weight Loss (lbs): 65 lb (29.5 kg)   Body Composition  Body Fat %: 40.2 % Fat Mass (lbs): 110.2 lbs Muscle Mass (lbs): 155.8 lbs Total Body Water  (lbs): 130.4 lbs Visceral Fat Rating : 24   Other Clinical Data Fasting: Yes Labs: No Today's Visit #: 27 Starting Date: 01/14/22     HPI  Chief Complaint: OBESITY  Geoffrey Robinson is here to discuss his progress with his obesity treatment plan. He is on the the Category 4 Plan and states he is following his eating plan approximately 50 % of the time. He states he is exercising 15 minutes 3-4 days per week.   Interval History:  Since last office visit he has lost 3 pounds.  He went to the beach last week. He is not skipping meals.   He is scheduled for left knee arthroplasty on 06/01/24.  He is walking to stay active but is limited due to knee pain.  His last PT was on 04/04/24.    BF:  3 eggs with bread, water  and coffee with Splenda and powder cream Snack:  pecans, diet soda (one daily) Lunch:  eats out-hamburger or chicken tenders or chicken wrap with french fries, Sprite Snack:  cheese Dinner:  protein with vegetables and carb, water  Snack:  sometimes 1/2 sandwich  His highest weight was 392 lbs.       Pharmacotherapy for weight loss: He is not currently taking medications for medical weight loss.     Previous pharmacotherapy for medical weight loss:  None   Bariatric surgery:  Patient has not had bariatric surgery.   Pharmacotherapy for DMT2:  He is currently  taking Mounjaro  7.5mg .  Denies side effects.   Last A1c was 6.7 on 02/11/24 CBGs:   135-167 Episodes of hypoglycemia: None On Lisinopril  40mg  and Xarelto  20mg .   Last eye exam:  8/24-rescheduled in November He doesn't want to take Metformin  Lab Results  Component Value Date   HGBA1C 6.7 (H) 02/11/2024   HGBA1C 6.7 (H) 10/06/2023   HGBA1C 6.7 (H) 06/02/2023   Lab Results  Component Value Date   LDLCALC 101 (H) 10/06/2023   CREATININE 0.83 02/18/2024     Hyperthyroidism Saw endo last on 01/18/24 and has a follow up appt scheduled on 04/21/24   Last TSH was 0.68, free T4 was 1.5 and free T3 was 3.4 on 03/06/24   -Thyroid  ultrasound revealed multinodular goiter in February, 2025   -Biopsy May 2nd-benign thyroid  FNA results.    -Cardiology stopped his amio and started him on digoxin  0.125mg  (started Dec 1st).   -Has discussed labs with cardiology   -denies palpitations, tachycardia and chest pain   -denies FH of thyroid  cancer  -He was started on Methimazoke 5mg  Mon-Friday.       Vit D deficiency  He is taking Vit D 50,000 IU weekly.  Denies side effects.  Denies nausea, vomiting or muscle weakness.    Lab Results  Component Value Date   VD25OH 36.6 10/06/2023   VD25OH 40.3 06/02/2023  VD25OH 43.1 02/24/2023     PHYSICAL EXAM:  Blood pressure 119/72, pulse 70, temperature 97.7 F (36.5 C), height 6' (1.829 m), weight 274 lb (124.3 kg), SpO2 99%. Body mass index is 37.16 kg/m.  General: He is overweight, cooperative, alert, well developed, and in no acute distress. PSYCH: Has normal mood, affect and thought process.   Extremities: No edema.  Neurologic: No gross sensory or motor deficits. No tremors or fasciculations noted.    DIAGNOSTIC DATA REVIEWED:  BMET    Component Value Date/Time   NA 138 02/18/2024 0327   NA 144 10/06/2023 0934   K 4.1 02/18/2024 0327   CL 105 02/18/2024 0327   CO2 22 02/18/2024 0327   GLUCOSE 307 (H) 02/18/2024 0327   BUN 16  02/18/2024 0327   BUN 19 10/06/2023 0934   CREATININE 0.83 02/18/2024 0327   CREATININE 0.99 03/11/2016 0853   CALCIUM 8.4 (L) 02/18/2024 0327   GFRNONAA >60 02/18/2024 0327   GFRAA >60 08/15/2019 0820   Lab Results  Component Value Date   HGBA1C 6.7 (H) 02/11/2024   HGBA1C  04/10/2009    5.9 (NOTE) The ADA recommends the following therapeutic goal for glycemic control related to Hgb A1c measurement: Goal of therapy: <6.5 Hgb A1c  Reference: American Diabetes Association: Clinical Practice Recommendations 2010, Diabetes Care, 2010, 33: (Suppl  1).   Lab Results  Component Value Date   INSULIN  30.0 (H) 06/30/2022   INSULIN  27.1 (H) 01/14/2022   Lab Results  Component Value Date   TSH 0.68 03/06/2024   CBC    Component Value Date/Time   WBC 8.8 02/18/2024 0327   RBC 4.07 (L) 02/18/2024 0327   HGB 11.6 (L) 02/18/2024 0327   HGB 13.6 05/11/2023 0848   HCT 36.1 (L) 02/18/2024 0327   HCT 41.7 05/11/2023 0848   PLT 164 02/18/2024 0327   PLT 224 05/11/2023 0848   MCV 88.7 02/18/2024 0327   MCV 82 05/11/2023 0848   MCH 28.5 02/18/2024 0327   MCHC 32.1 02/18/2024 0327   RDW 13.6 02/18/2024 0327   RDW 15.2 05/11/2023 0848   Iron Studies No results found for: IRON, TIBC, FERRITIN, IRONPCTSAT Lipid Panel     Component Value Date/Time   CHOL 161 10/06/2023 0934   TRIG 174 (H) 10/06/2023 0934   HDL 29 (L) 10/06/2023 0934   CHOLHDL 4.6 04/11/2009 0451   VLDL 20 04/11/2009 0451   LDLCALC 101 (H) 10/06/2023 0934   Hepatic Function Panel     Component Value Date/Time   PROT 7.1 10/06/2023 0934   ALBUMIN 4.3 10/06/2023 0934   AST 21 10/06/2023 0934   ALT 26 10/06/2023 0934   ALKPHOS 87 10/06/2023 0934   BILITOT 0.4 10/06/2023 0934      Component Value Date/Time   TSH 0.68 03/06/2024 0930   Nutritional Lab Results  Component Value Date   VD25OH 36.6 10/06/2023   VD25OH 40.3 06/02/2023   VD25OH 43.1 02/24/2023     ASSESSMENT AND PLAN  TREATMENT PLAN  FOR OBESITY:  Recommended Dietary Goals  Geoffrey Robinson is currently in the action stage of change. As such, his goal is to continue weight management plan. He has agreed to practicing portion control and making smarter food choices, such as increasing vegetables and decreasing simple carbohydrates.  Behavioral Intervention  We discussed the following Behavioral Modification Strategies today: increasing lean protein intake to established goals, decreasing simple carbohydrates , increasing vegetables, increasing fiber rich foods, increasing water  intake , work on  meal planning and preparation, reading food labels , keeping healthy foods at home, continue to work on implementation of reduced calorie nutritional plan, continue to practice mindfulness when eating, planning for success, continue to work on maintaining a reduced calorie state, getting the recommended amount of protein, incorporating whole foods, making healthy choices, staying well hydrated and practicing mindfulness when eating., and increase protein intake, fibrous foods (25 grams per day for women, 30 grams for men) and water  to improve satiety and decrease hunger signals. .  Additional resources provided today: NA  Recommended Physical Activity Goals  Geoffrey Robinson has been seeing PT since having knee surgery.  He is scheduled to have L knee surgery 06/01/24.  Will exercise per PT and ortho recommendations.   ASSOCIATED CONDITIONS ADDRESSED TODAY  Action/Plan  Type 2 diabetes mellitus in patient with obesity (HCC) -     Tirzepatide ; Inject 7.5 mg into the skin once a week.  Dispense: 2 mL; Refill: 0.  Side effects discussed  Will stop Mounjaro  2 weeks prior to surgery.   Hyperthyroidism Keep appt with Endo on 04/21/24  Vitamin D  insufficiency -     Vitamin D  (Ergocalciferol ); Take 1 capsule (50,000 Units total) by mouth every 7 (seven) days.  Dispense: 5 capsule; Refill: 0.  Side effects discussed  Will stop Vit D 2 weeks prior to  surgery  Obesity, Class II, BMI 35-39.9     Goals: Increase water  intake Add a protein shake daily    Return in about 4 weeks (around 05/17/2024).Geoffrey Robinson He was informed of the importance of frequent follow up visits to maximize his success with intensive lifestyle modifications for his multiple health conditions.   ATTESTASTION STATEMENTS:  Reviewed by clinician on day of visit: allergies, medications, problem list, medical history, surgical history, family history, social history, and previous encounter notes.      Geoffrey Robinson. Wanda Rideout FNP-C

## 2024-04-21 ENCOUNTER — Ambulatory Visit: Admitting: Internal Medicine

## 2024-04-21 ENCOUNTER — Other Ambulatory Visit

## 2024-04-21 VITALS — BP 128/70 | HR 67 | Ht 72.0 in | Wt 282.8 lb

## 2024-04-21 DIAGNOSIS — E0590 Thyrotoxicosis, unspecified without thyrotoxic crisis or storm: Secondary | ICD-10-CM

## 2024-04-21 DIAGNOSIS — E042 Nontoxic multinodular goiter: Secondary | ICD-10-CM | POA: Diagnosis not present

## 2024-04-21 NOTE — Progress Notes (Signed)
 Name: Geoffrey Robinson  MRN/ DOB: [id number], 1960-06-28    Age/ Sex: 63 y.o., male    PCP: Theophilus Andrews, Tully GRADE, MD   Reason for Endocrinology Evaluation: Hyperthyroidism     Date of Initial Endocrinology Evaluation: 01/18/2024    HPI: [name] is a 63 y.o. male with a past medical history of DM, A-fib, HTN, OSA, GERD. The patient presented for initial endocrinology clinic visit on 01/18/2024 for consultative assistance with his Hyperthyroidism.   Patient has been noted with low TSH in September, 2024 at 0.367 u IU/mL, his TSH remained somewhat stable by October, 2024 with an elevated free T4 at 1.8 NG/DL and normal T3.   Thyroid  ultrasound revealed multinodular goiter in February, 2025  He is s/p benign FNA of the left mid left thyroid  nodule 2.5 cm May, 2025  In reviewing his chart, the patient has had thyroid  uptake and scan in 2007, results were consistent with acute thyroiditis  He was on Amiodarone  for ~ 3 years until 03/2023   No FH of thyroid  disease    On his initial visit to our clinic he was started on methimazole  with a TSH of 0.26u IU/ML   SUBJECTIVE:    Today (04/21/24):  Geoffrey Robinson is here for follow-up and multinodular goiter and hyperthyroidism.    He is s/p right knee replacement 01/2024 Patient is scheduled for left  total knee replacement 06/01/2024  Patient follows with [facility]  Patient continues weight loss No local neck swelling  No changes in chronic palpitations  No constipation or diarrhea  No changes in fine tremors  No changes in energy level       Methimazole  5 mg, 1 tablet Monday through Friday  HISTORY:  Past Medical History:  Past Medical History:  Diagnosis Date   Arthritis    Ascending aorta dilatation    41mm by CT>>normal when indexed for BSA   Back pain    Bradycardia 10/21/2015   Diabetes mellitus without complication (HCC)    Dyslipidemia    Heart  burn    High cholesterol    HTN (hypertension)    Joint pain    NICM (nonischemic cardiomyopathy) (HCC)    a. LHC 2002: no CAD, EF 35-40%; b. Echo 2003: EF 60% - tachycardia mediated (AFlutter with RVR).  EF 50% on echo 11/2020   Obesity    Palpitation    Persistent atrial fibrillation (HCC)    CHADS2VASC score is 2.  s/p remote afib and aflutter ablations   Severe obstructive sleep apnea    , CPAP at 14cm H2O   Swelling of lower extremity    Varicose veins    Past Surgical History:  Past Surgical History:  Procedure Laterality Date   afib ablation      BIOPSY THYROID      10/2023   CARDIAC SURGERY     CARDIOVERSION N/A 09/07/2019   Procedure: CARDIOVERSION;  Surgeon: Loni Soyla LABOR, MD;  Location: [facility] ENDOSCOPY;  Service: Cardiovascular;  Laterality: N/A;   COLONOSCOPY     KNEE ARTHROPLASTY Right 02/17/2024   Procedure: ARTHROPLASTY, KNEE, TOTAL, USING IMAGELESS COMPUTER-ASSISTED NAVIGATION;  Surgeon: Fidel Rogue, MD;  Location: [facility] ORS;  Service: Orthopedics;  Laterality: Right;   right knee surgery      VARICOSE VEIN SURGERY      Social History:  reports that he has never smoked. He has never used smokeless tobacco. He reports current alcohol  use  of about 3.0 standard drinks of alcohol  per week. He reports that he does not use drugs. Family History: family history includes Diabetes in his mother; Heart disease in his father and mother; High Cholesterol in his father and mother; High blood pressure in his mother; Hypertension in his father and mother; Sleep apnea in his mother.   HOME MEDICATIONS: Allergies as of 04/21/2024       Reactions   Plavix [clopidogrel Bisulfate] Rash        Medication List        Accurate as of April 21, 2024  9:30 AM. If you have any questions, ask your nurse or doctor.          STOP taking these medications    Betasept  Surgical Scrub 4 % external liquid Generic drug: chlorhexidine    methocarbamol  500 MG tablet Commonly  known as: ROBAXIN    ondansetron  4 MG tablet Commonly known as: Zofran    oxyCODONE  5 MG immediate release tablet Commonly known as: Roxicodone        TAKE these medications    Acetaminophen  Extra Strength 500 MG Tabs Take 2 tablets (1,000 mg total) by mouth every 8 (eight) hours as needed for moderate pain (pain score 4-6).   digoxin  0.125 MG tablet Commonly known as: LANOXIN  TAKE 1 TABLET BY MOUTH DAILY   diltiazem  360 MG 24 hr capsule Commonly known as: CARDIZEM  CD TAKE 1 CAPSULE BY MOUTH EVERY DAY   fenofibrate  145 MG tablet Commonly known as: TRICOR  Take 1 tablet (145 mg total) by mouth daily.   finasteride  5 MG tablet Commonly known as: PROSCAR  Take 5 mg by mouth daily.   furosemide  20 MG tablet Commonly known as: LASIX  TAKE 1 TABLET AS NEEDED FOR EXCESS SWELLING/WEIGHT GAIN 2-3LBS PER DAY OR 5LBS PER WEEK   lisinopril  40 MG tablet Commonly known as: ZESTRIL  Take 1 tablet (40 mg total) by mouth daily.   methimazole  5 MG tablet Commonly known as: TAPAZOLE  Take 1 tablet (5 mg total) by mouth as directed. 1 tablet Monday through Friday and none Saturdays or sundays   metoprolol  tartrate 100 MG tablet Commonly known as: LOPRESSOR  TAKE 1 TABLET BY MOUTH EVERY MORNING AND 1&1/2 TABLETS EVERY EVENING What changed: See the new instructions.   omeprazole  40 MG capsule Commonly known as: PRILOSEC Take 1 capsule (40 mg total) by mouth daily.   tamsulosin  0.4 MG Caps capsule Commonly known as: FLOMAX  Take 0.4 mg by mouth at bedtime.   tirzepatide  7.5 MG/0.5ML Pen Commonly known as: MOUNJARO  Inject 7.5 mg into the skin once a week.   Vitamin D  (Ergocalciferol ) 1.25 MG (50000 UNIT) Caps capsule Commonly known as: DRISDOL  Take 1 capsule (50,000 Units total) by mouth every 7 (seven) days.   Xarelto  20 MG Tabs tablet Generic drug: rivaroxaban  TAKE ONE TABLET BY MOUTH ONE TIME DAILY WITH SUPPER          REVIEW OF SYSTEMS: A comprehensive ROS was conducted  with the patient and is negative except as per HPI     OBJECTIVE:  VS: BP 128/70   Pulse 67   Ht 6' (1.829 m)   Wt 282 lb 12.8 oz (128.3 kg)   SpO2 98%   BMI 38.35 kg/m    Wt Readings from Last 3 Encounters:  04/21/24 282 lb 12.8 oz (128.3 kg)  04/19/24 274 lb (124.3 kg)  03/22/24 277 lb (125.6 kg)     EXAM: General: Pt appears well and is in NAD  Eyes: External eye exam  normal without stare, lid lag or exophthalmos.  EOM intact.  PERRL.  Neck: General: Supple without adenopathy. Thyroid : Thyroid  size normal.  No goiter or nodules appreciated. No thyroid  bruit.  Lungs: Clear with good BS bilat   Heart: Auscultation: RRR.  Extremities:  BL LE: No pretibial edema   Mental Status: Judgment, insight: Intact Orientation: Oriented to time, place, and person Mood and affect: No depression, anxiety, or agitation     DATA REVIEWED:     Latest Reference Range & Units 04/21/24 09:47  TSH 0.40 - 4.50 mIU/L 0.70  Triiodothyronine,Free,Serum 2.3 - 4.2 pg/mL 3.3  T4,Free(Direct) 0.8 - 1.8 ng/dL 1.3    Thyroid  ultrasound 08/13/2023   Estimated total number of nodules >/= 1 cm: 4   Number of spongiform nodules >/=  2 cm not described below (TR1): 0   Number of mixed cystic and solid nodules >/= 1.5 cm not described below (TR2): 0   _________________________________________________________   Nodule # 1:   Location: Right; Superior   Maximum size: 1.4 cm; Other 2 dimensions: 1.0 x 0.9 cm   Composition: spongiform (0)   Echogenicity: hypoechoic (2)   Shape: not taller-than-wide (0)   Margins: smooth (0)   Echogenic foci: none (0)   ACR TI-RADS total points: 2.   ACR TI-RADS risk category: TR2 (2 points).   ACR TI-RADS recommendations:   This nodule does NOT meet TI-RADS criteria for biopsy or dedicated follow-up.   _________________________________________________________   Nodule # 2:   Location: Right; Mid   Maximum size: 1.6 cm; Other 2 dimensions:  1.4 x 1.3 cm   Composition: mixed cystic and solid (1)   Echogenicity: isoechoic (1)   Shape: not taller-than-wide (0)   Margins: ill-defined (0)   Echogenic foci: none (0)   ACR TI-RADS total points: 2.   ACR TI-RADS risk category: TR2 (2 points).   ACR TI-RADS recommendations:   This nodule does NOT meet TI-RADS criteria for biopsy or dedicated follow-up.   _________________________________________________________   Nodule # 3:   Location: Right; Inferior   Maximum size: 2.2 cm; Other 2 dimensions: 1.8 x 1.6 cm   Composition: mixed cystic and solid (1)   Echogenicity: isoechoic (1)   Shape: not taller-than-wide (0)   Margins: ill-defined (0)   Echogenic foci: none (0)   ACR TI-RADS total points: 2.   ACR TI-RADS risk category: TR2 (2 points).   ACR TI-RADS recommendations:   This nodule does NOT meet TI-RADS criteria for biopsy or dedicated follow-up.   _________________________________________________________   Nodule # 4:   Location: Left; Mid   Maximum size: 2.5 cm; Other 2 dimensions: 1.6 x 1.4 cm   Composition: mixed cystic and solid (1)   Echogenicity: hypoechoic (2)   Shape: not taller-than-wide (0)   Margins: ill-defined (0)   Echogenic foci: none (0)   ACR TI-RADS total points: 3.   ACR TI-RADS risk category: TR3 (3 points).   ACR TI-RADS recommendations:   **Given size (>/= 2.5 cm) and appearance, fine needle aspiration of this mildly suspicious nodule should be considered based on TI-RADS criteria.   _________________________________________________________   Nodule # 5:   Location: Left; Inferior   Maximum size: 0.8 cm; Other 2 dimensions: 0.8 x 0.4 cm   Composition: spongiform (0)   Echogenicity: hypoechoic (2)   Shape: not taller-than-wide (0)   Margins: smooth (0)   Echogenic foci: none (0)   ACR TI-RADS total points: 2.   ACR TI-RADS risk category: TR2 (2  points).   ACR TI-RADS recommendations:    This nodule does NOT meet TI-RADS criteria for biopsy or dedicated follow-up.   _________________________________________________________   No cervical lymphadenopathy.   IMPRESSION: 1. Multinodular goiter. 2. Solid cystic nodule in the left mid thyroid  (labeled 4, 2.5 cm) meets criteria (TI-RADS category 3) for tissue sampling. Recommend ultrasound-guided fine-needle aspiration. 3. The remaining visualized bilateral thyroid  nodules appear benign and do not warrant additional follow-up.    FNA Left Mid 2.5 cm thyroid  nodule 10/22/2023   Clinical History: Left; Mid 2.5cm;  Other 2 dimensions: 1.6 x 1.4cm,  mixed cystic and solid hypoechoic TI-RADS - 3  Specimen Submitted:  A. THYROID , LEFT MID, FINE NEEDLE ASPIRATION    FINAL MICROSCOPIC DIAGNOSIS:  - Benign follicular nodule (Bethesda category II)    ASSESSMENT/PLAN/RECOMMENDATIONS:   Subclinical Hyperthyroidism  -Patient is clinically euthyroid - Differential diagnosis to include amiodarone , Graves' disease, versus autonomous thyroid  nodule -We have opted to stop methimazole  due to history of A-fib -TFTs today are within normal range  Medications : Methimazole  5 mg Monday through Friday, none on Saturdays on Sundays    2. Multinodular Goiter :  -No local neck symptoms - S/P benign FNA of the left mid thyroid  nodule in 10/2023 -Patient be due for repeat ultrasound on next visit   Follow-up in 6 months  Signed electronically by: Stefano Redgie Butts, MD  [facility] Endocrinology  [facility] [address] [place], [place] [address] Phone: [phone] FAX: [phone]   CC: Theophilus Andrews, Tully GRADE, MD [address] [place] [address] Phone: [phone] Fax: [phone]   Return to Endocrinology clinic as below: Future Appointments  Date Time Provider Department Center  05/25/2024  8:45 AM Becki Krabbe, FNP [facility] None  05/26/2024  9:00 AM [facility]  [facility] 1 [facility] None

## 2024-04-22 LAB — T3, FREE: T3, Free: 3.3 pg/mL (ref 2.3–4.2)

## 2024-04-22 LAB — T4, FREE: Free T4: 1.3 ng/dL (ref 0.8–1.8)

## 2024-04-22 LAB — TSH: TSH: 0.7 m[IU]/L (ref 0.40–4.50)

## 2024-04-25 ENCOUNTER — Ambulatory Visit: Payer: Self-pay | Admitting: Internal Medicine

## 2024-04-25 MED ORDER — METHIMAZOLE 5 MG PO TABS: 5.0000 mg | ORAL_TABLET | ORAL | 3 refills | Status: AC

## 2024-05-19 NOTE — Progress Notes (Signed)
 Sent message, via epic in basket, requesting orders in epic from Careers adviser.

## 2024-05-22 LAB — OPHTHALMOLOGY REPORT-SCANNED

## 2024-05-23 ENCOUNTER — Other Ambulatory Visit: Payer: Self-pay | Admitting: Ophthalmology

## 2024-05-23 ENCOUNTER — Ambulatory Visit: Payer: Self-pay | Admitting: Student

## 2024-05-23 DIAGNOSIS — Z01818 Encounter for other preprocedural examination: Secondary | ICD-10-CM

## 2024-05-23 DIAGNOSIS — H353112 Nonexudative age-related macular degeneration, right eye, intermediate dry stage: Secondary | ICD-10-CM

## 2024-05-23 DIAGNOSIS — H353121 Nonexudative age-related macular degeneration, left eye, early dry stage: Secondary | ICD-10-CM

## 2024-05-23 NOTE — H&P (Signed)
 TOTAL KNEE ADMISSION H&P  Patient is being admitted for left total knee arthroplasty.  Subjective:  Chief Complaint:left knee pain.  HPI: Geoffrey Robinson, 63 y.o. male, has a history of pain and functional disability in the left knee due to arthritis and has failed non-surgical conservative treatments for greater than 12 weeks to includeNSAID's and/or analgesics, corticosteriod injections, viscosupplementation injections, flexibility and strengthening excercises, use of assistive devices, weight reduction as appropriate, and activity modification.  Onset of symptoms was gradual, starting 10 years ago with rapidlly worsening course since that time. The patient noted no past surgery on the left knee(s).  Patient currently rates pain in the left knee(s) at 10 out of 10 with activity. Patient has night pain, worsening of pain with activity and weight bearing, pain that interferes with activities of daily living, pain with passive range of motion, crepitus, and joint swelling.  Patient has evidence of subchondral cysts, subchondral sclerosis, periarticular osteophytes, and joint space narrowing by imaging studies. There is no active infection.  Patient Active Problem List   Diagnosis Date Noted   S/P total knee arthroplasty, right 02/18/2024   Hyperthyroidism 01/19/2024   Multinodular goiter 01/19/2024   Low serum HDL 02/03/2022   Vitamin D  insufficiency 01/28/2022   Type 2 diabetes mellitus with obesity 01/27/2022   Low HDL (under 40) 01/15/2022   Other fatigue 01/14/2022   SOB (shortness of breath) on exertion 01/14/2022   Vitamin D  deficiency 01/14/2022   Depression screening 01/14/2022   Class 3 severe obesity with serious comorbidity and body mass index (BMI) of 45.0 to 49.9 in adult [facility]) 01/14/2022   Ascending aorta dilatation 12/19/2020   Arthritis of both knees 10/30/2019   Nonischemic cardiomyopathy (HCC) 10/13/2019   Atrial flutter (HCC) 06/12/2019   Acquired thrombophilia  06/12/2019   Bradycardia 10/21/2015   Anticoagulant long-term use 09/09/2015   GERD (gastroesophageal reflux disease) 09/09/2015   Varicose veins of leg with complications 03/11/2015   Varicose veins of lower extremities with complications 11/20/2014   Persistent atrial fibrillation (HCC)    HTN (hypertension)    Warfarin anticoagulation    Dyslipidemia    Severe obstructive sleep apnea    Morbid obesity (HCC)    Past Medical History:  Diagnosis Date   Arthritis    Ascending aorta dilatation    41mm by CT>>normal when indexed for BSA   Back pain    Bradycardia 10/21/2015   Diabetes mellitus without complication (HCC)    Dyslipidemia    Heart burn    High cholesterol    HTN (hypertension)    Joint pain    NICM (nonischemic cardiomyopathy) (HCC)    a. LHC 2002: no CAD, EF 35-40%; b. Echo 2003: EF 60% - tachycardia mediated (AFlutter with RVR).  EF 50% on echo 11/2020   Obesity    Palpitation    Persistent atrial fibrillation (HCC)    CHADS2VASC score is 2.  s/p remote afib and aflutter ablations   Severe obstructive sleep apnea    , CPAP at 14cm H2O   Swelling of lower extremity    Varicose veins     Past Surgical History:  Procedure Laterality Date   afib ablation      BIOPSY THYROID      10/2023   CARDIAC SURGERY     CARDIOVERSION N/A 09/07/2019   Procedure: CARDIOVERSION;  Surgeon: Loni Soyla LABOR, MD;  Location: [facility] ENDOSCOPY;  Service: Cardiovascular;  Laterality: N/A;   COLONOSCOPY     KNEE ARTHROPLASTY Right 02/17/2024  Procedure: ARTHROPLASTY, KNEE, TOTAL, USING IMAGELESS COMPUTER-ASSISTED NAVIGATION;  Surgeon: Fidel Rogue, MD;  Location: [facility]S;  Service: Orthopedics;  Laterality: Right;   right knee surgery      VARICOSE VEIN SURGERY      Current Outpatient Medications  Medication Sig Dispense Refill Last Dose/Taking   acetaminophen  (TYLENOL ) 500 MG tablet Take 2 tablets (1,000 mg total) by mouth every 8 (eight) hours as needed for moderate pain (pain  score 4-6). 30 tablet 0    digoxin  (LANOXIN ) 0.125 MG tablet TAKE 1 TABLET BY MOUTH DAILY 90 tablet 0    diltiazem  (CARDIZEM  CD) 360 MG 24 hr capsule TAKE 1 CAPSULE BY MOUTH EVERY DAY 90 capsule 3    fenofibrate  (TRICOR ) 145 MG tablet Take 1 tablet (145 mg total) by mouth daily. 90 tablet 1    finasteride  (PROSCAR ) 5 MG tablet Take 5 mg by mouth daily.      furosemide  (LASIX ) 20 MG tablet TAKE 1 TABLET AS NEEDED FOR EXCESS SWELLING/WEIGHT GAIN 2-3LBS PER DAY OR 5LBS PER WEEK 90 tablet 3    lisinopril  (ZESTRIL ) 40 MG tablet Take 1 tablet (40 mg total) by mouth daily. 90 tablet 1    methimazole  (TAPAZOLE ) 5 MG tablet Take 1 tablet (5 mg total) by mouth as directed. 1 tablet Monday through Friday and none Saturdays or sundays 65 tablet 3    metoprolol  tartrate (LOPRESSOR ) 100 MG tablet TAKE 1 TABLET BY MOUTH EVERY MORNING AND 1&1/2 TABLETS EVERY EVENING (Patient taking differently: Take 100-150 mg by mouth See admin instructions. Take 100 mg in the morning and 150 mg at bedtime) 225 tablet 3    omeprazole  (PRILOSEC) 40 MG capsule Take 1 capsule (40 mg total) by mouth daily. 90 capsule 1    tamsulosin  (FLOMAX ) 0.4 MG CAPS capsule Take 0.4 mg by mouth at bedtime.      tirzepatide  (MOUNJARO ) 7.5 MG/0.5ML Pen Inject 7.5 mg into the skin once a week. 2 mL 0    Vitamin D , Ergocalciferol , (DRISDOL ) 1.25 MG (50000 UNIT) CAPS capsule Take 1 capsule (50,000 Units total) by mouth every 7 (seven) days. 5 capsule 0    XARELTO  20 MG TABS tablet TAKE ONE TABLET BY MOUTH ONE TIME DAILY WITH SUPPER 90 tablet 1    No current facility-administered medications for this visit.   Allergies  Allergen Reactions   Plavix [Clopidogrel Bisulfate] Rash    Social History   Tobacco Use   Smoking status: Never   Smokeless tobacco: Never  Substance Use Topics   Alcohol  use: Yes    Alcohol /week: 3.0 standard drinks of alcohol     Types: 1 Standard drinks or equivalent, 1 Cans of beer, 1 Glasses of wine per week    Comment:  occassional beer and wine    Family History  Problem Relation Age of Onset   Hypertension Mother    Diabetes Mother    High blood pressure Mother    High Cholesterol Mother    Heart disease Mother    Sleep apnea Mother    Hypertension Father    Heart disease Father    High Cholesterol Father      Review of Systems  Cardiovascular:  Positive for leg swelling.  Musculoskeletal:  Positive for arthralgias, gait problem and joint swelling.  All other systems reviewed and are negative.   Objective:  Physical Exam Constitutional:      Appearance: Normal appearance.  HENT:     Head: Normocephalic and atraumatic.     Nose:  Nose normal.     Mouth/Throat:     Mouth: Mucous membranes are moist.     Pharynx: Oropharynx is clear.  Eyes:     Conjunctiva/sclera: Conjunctivae normal.  Cardiovascular:     Rate and Rhythm: Normal rate and regular rhythm.     Pulses: Normal pulses.  Pulmonary:     Effort: Pulmonary effort is normal.     Breath sounds: Normal breath sounds.  Abdominal:     General: Abdomen is flat.     Palpations: Abdomen is soft.  Genitourinary:    Comments: Deferred. Musculoskeletal:     Cervical back: Normal range of motion and neck supple.     Comments: Examination of the left knee reveals no skin wounds or lesions. Significant varicose veins about the knee. He has swelling, trace effusion. Varus deformity. Tenderness to palpation medial joint line, lateral joint line, peripatellar retinacular tissues with a positive grind sign. Range of motion 15 to 105 degrees without any ligamentous instability. Painless range of motion of the hip.    Sensory and motor function intact in LE bilaterally. Distal pedal pulses 2+ bilaterally.    Moderate pedal edema. Calves soft and non-tender.   Skin:    General: Skin is warm and dry.     Capillary Refill: Capillary refill takes less than 2 seconds.  Neurological:     General: No focal deficit present.     Mental Status: He is  alert and oriented to person, place, and time.  Psychiatric:        Mood and Affect: Mood normal.        Behavior: Behavior normal.        Thought Content: Thought content normal.        Judgment: Judgment normal.     Vital signs in last 24 hours: @VSRANGES @  Labs:   Estimated body mass index is 38.35 kg/m as calculated from the following:   Height as of 04/21/24: 6' (1.829 m).   Weight as of 04/21/24: 128.3 kg.   Imaging Review Plain radiographs demonstrate severe degenerative joint disease of the left knee(s). The overall alignment issignificant varus. The bone quality appears to be adequate for age and reported activity level.      Assessment/Plan:  End stage arthritis, left knee   The patient history, physical examination, clinical judgment of the provider and imaging studies are consistent with end stage degenerative joint disease of the left knee(s) and total knee arthroplasty is deemed medically necessary. The treatment options including medical management, injection therapy arthroscopy and arthroplasty were discussed at length. The risks and benefits of total knee arthroplasty were presented and reviewed. The risks due to aseptic loosening, infection, stiffness, patella tracking problems, thromboembolic complications and other imponderables were discussed. The patient acknowledged the explanation, agreed to proceed with the plan and consent was signed. Patient is being admitted for inpatient treatment for surgery, pain control, PT, OT, prophylactic antibiotics, VTE prophylaxis, progressive ambulation and ADL's and discharge planning. The patient is planning to be discharged home with OPPT after an overnight stay.    Therapy Plans: Outpatient therapy. [facility] 06/05/24.  Disposition: Home with wife.  Planned DVT Prophylaxis: Xarelto  20mg  daily DME needed: Has rolling walker and ice machine.  PCP: Cleared.  Cardiology: Cleared. Hold xarelto  4 days prior to  surgery. Restart when medically optimized.  TXA: IV Allergies: - Plavix - rash  Anesthesia Concerns: None.  BMI: 36.8 Last HgbA1c: 6.7. Other:  - S/p right TKA.  - Mixed CHF -  A-fib, chronic xarelto  20mg  daily. Discussed to hold 4 days prior to surgery.  - Severe OSA, uses CPAP.  - Chronic venous stasis with edema, compression stockings 20-58mmHg. Please bring to hospital. - T2DM, Mounjaro , hold 14 days prior to surgery.  - Staples.  - Oxycodone , zofran , methocarbamol , no NSAIDs. - Labs pending.     Patient's anticipated LOS is less than 2 midnights, meeting these requirements: - Younger than 73 - Lives within 1 hour of care - Has a competent adult at home to recover with post-op recover - NO history of  - Chronic pain requiring opiods  - Diabetes  - Coronary Artery Disease  - Heart failure  - Heart attack  - Stroke  - DVT/VTE  - Cardiac arrhythmia  - Respiratory Failure/COPD  - Renal failure  - Anemia  - Advanced Liver disease

## 2024-05-24 NOTE — Patient Instructions (Signed)
 SURGICAL WAITING ROOM VISITATION  Patients having surgery or a procedure may have no more than 2 support people in the waiting area - these visitors may rotate.    Children under the age of 26 must have an adult with them who is not the patient.  Visitors with respiratory illnesses are discouraged from visiting and should remain at home.  If the patient needs to stay at the hospital during part of their recovery, the visitor guidelines for inpatient rooms apply. Pre-op nurse will coordinate an appropriate time for 1 support person to accompany patient in pre-op.  This support person may not rotate.    Please refer to the [facility] website for the visitor guidelines for Inpatients (after your surgery is over and you are in a regular room).       Your procedure is scheduled on:  06/01/2024    Report to [facility] Main Entrance    Report to admitting at   0900AM   Call this number if you have problems the morning of surgery [phone]   Do not eat food :After Midnight.   After Midnight you may have the following liquids until _ 0830_____ AM  DAY OF SURGERY  Water  Non-Citrus Juices (without pulp, NO RED-Apple, White grape, White cranberry) Black Coffee (NO MILK/CREAM OR CREAMERS, sugar ok)  Clear Tea (NO MILK/CREAM OR CREAMERS, sugar ok) regular and decaf                             Plain Jell-O (NO RED)                                           Fruit ices (not with fruit pulp, NO RED)                                     Popsicles (NO RED)                                                               Sports drinks like Gatorade (NO RED)                   The day of surgery:  Drink ONE (1) Pre-Surgery Clear Ensure or G2 at  0830AM the morning of surgery. Drink in one sitting. Do not sip.  This drink was given to you during your hospital  pre-op appointment visit. Nothing else to drink after completing the  Pre-Surgery Clear Ensure or G2.          If you have  questions, please contact your surgeon's office.       Oral Hygiene is also important to reduce your risk of infection.                                    Remember - BRUSH YOUR TEETH THE MORNING OF SURGERY WITH YOUR REGULAR TOOTHPASTE  DENTURES WILL BE REMOVED PRIOR TO SURGERY PLEASE DO NOT APPLY Poly grip OR ADHESIVES!!!  Do NOT smoke after Midnight   Stop all vitamins and herbal supplements 7 days before surgery.   Take these medicines the morning of surgery with A SIP OF WATER :  lanoxin , diltiazem , proscar , metoprolol , omeprazole ,              Mounjaro - last dose on   DO NOT TAKE ANY ORAL DIABETIC MEDICATIONS DAY OF YOUR SURGERY  Bring CPAP mask and tubing day of surgery.                              You may not have any metal on your body including hair pins, jewelry, and body piercing             Do not wear make-up, lotions, powders, perfumes/cologne, or deodorant  Do not wear nail polish including gel and S&S, artificial/acrylic nails, or any other type of covering on natural nails including finger and toenails. If you have artificial nails, gel coating, etc. that needs to be removed by a nail salon please have this removed prior to surgery or surgery may need to be canceled/ delayed if the surgeon/ anesthesia feels like they are unable to be safely monitored.   Do not shave  48 hours prior to surgery.               Men may shave face and neck.   Do not bring valuables to the hospital. [facility] IS NOT             RESPONSIBLE   FOR VALUABLES.   Contacts, glasses, dentures or bridgework may not be worn into surgery.   Bring small overnight bag day of surgery.   DO NOT BRING YOUR HOME MEDICATIONS TO THE HOSPITAL. PHARMACY WILL DISPENSE MEDICATIONS LISTED ON YOUR MEDICATION LIST TO YOU DURING YOUR ADMISSION IN THE HOSPITAL!    Patients discharged on the day of surgery will not be allowed to drive home.  Someone NEEDS to stay with you for the first 24 hours after  anesthesia.   Special Instructions: Bring a copy of your healthcare power of attorney and living will documents the day of surgery if you haven't scanned them before.              Please read over the following fact sheets you were given: IF YOU HAVE QUESTIONS ABOUT YOUR PRE-OP INSTRUCTIONS PLEASE CALL [phone].   If you received a COVID test during your pre-op visit  it is requested that you wear a mask when out in public, stay away from anyone that may not be feeling well and notify your surgeon if you develop symptoms. If you test positive for Covid or have been in contact with anyone that has tested positive in the last 10 days please notify you surgeon.      Pre-operative 4 CHG Bath Instructions   You can play a key role in reducing the risk of infection after surgery. Your skin needs to be as free of germs as possible. You can reduce the number of germs on your skin by washing with CHG (chlorhexidine  gluconate) soap before surgery. CHG is an antiseptic soap that kills germs and continues to kill germs even after washing.   DO NOT use if you have an allergy to chlorhexidine /CHG or antibacterial soaps. If your skin becomes reddened or irritated, stop using the CHG and notify one of our RNs at [phone].   Please shower with the CHG soap starting  4 days before surgery using the following schedule:     Please keep in mind the following:  DO NOT shave, including legs and underarms, starting the day of your first shower.   You may shave your face at any point before/day of surgery.  Place clean sheets on your bed the day you start using CHG soap. Use a clean washcloth (not used since being washed) for each shower. DO NOT sleep with pets once you start using the CHG.   CHG Shower Instructions:  If you choose to wash your hair and private area, wash first with your normal shampoo/soap.  After you use shampoo/soap, rinse your hair and body thoroughly to remove shampoo/soap residue.   Turn the water  OFF and apply about 3 tablespoons (45 ml) of CHG soap to a CLEAN washcloth.  Apply CHG soap ONLY FROM YOUR NECK DOWN TO YOUR TOES (washing for 3-5 minutes)  DO NOT use CHG soap on face, private areas, open wounds, or sores.  Pay special attention to the area where your surgery is being performed.  If you are having back surgery, having someone wash your back for you may be helpful. Wait 2 minutes after CHG soap is applied, then you may rinse off the CHG soap.  Pat dry with a clean towel  Put on clean clothes/pajamas   If you choose to wear lotion, please use ONLY the CHG-compatible lotions on the back of this paper.     Additional instructions for the day of surgery: DO NOT APPLY any lotions, deodorants, cologne, or perfumes.   Put on clean/comfortable clothes.  Brush your teeth.  Ask your nurse before applying any prescription medications to the skin.      CHG Compatible Lotions   Aveeno Moisturizing lotion  Cetaphil Moisturizing Cream  Cetaphil Moisturizing Lotion  Clairol Herbal Essence Moisturizing Lotion, Dry Skin  Clairol Herbal Essence Moisturizing Lotion, Extra Dry Skin  Clairol Herbal Essence Moisturizing Lotion, Normal Skin  Curel Age Defying Therapeutic Moisturizing Lotion with Alpha Hydroxy  Curel Extreme Care Body Lotion  Curel Soothing Hands Moisturizing Hand Lotion  Curel Therapeutic Moisturizing Cream, Fragrance-Free  Curel Therapeutic Moisturizing Lotion, Fragrance-Free  Curel Therapeutic Moisturizing Lotion, Original Formula  Eucerin Daily Replenishing Lotion  Eucerin Dry Skin Therapy Plus Alpha Hydroxy Crme  Eucerin Dry Skin Therapy Plus Alpha Hydroxy Lotion  Eucerin Original Crme  Eucerin Original Lotion  Eucerin Plus Crme Eucerin Plus Lotion  Eucerin TriLipid Replenishing Lotion  Keri Anti-Bacterial Hand Lotion  Keri Deep Conditioning Original Lotion Dry Skin Formula Softly Scented  Keri Deep Conditioning Original Lotion, Fragrance  Free Sensitive Skin Formula  Keri Lotion Fast Absorbing Fragrance Free Sensitive Skin Formula  Keri Lotion Fast Absorbing Softly Scented Dry Skin Formula  Keri Original Lotion  Keri Skin Renewal Lotion Keri Silky Smooth Lotion  Keri Silky Smooth Sensitive Skin Lotion  Nivea Body Creamy Conditioning Oil  Nivea Body Extra Enriched Teacher, Adult Education Moisturizing Lotion Nivea Crme  Nivea Skin Firming Lotion  NutraDerm 30 Skin Lotion  NutraDerm Skin Lotion  NutraDerm Therapeutic Skin Cream  NutraDerm Therapeutic Skin Lotion  ProShield Protective Hand Cream  Provon moisturizing lotion

## 2024-05-24 NOTE — Progress Notes (Signed)
 Anesthesia Review:  PCP: Tickerhoff LOV 04/14/24  Cardiologist : Danelle Birmingham- LOV 07/07/23  Endo- shamleffer LVO 04/21/24   PPM/ ICD: Device Orders: Rep Notified:  Chest x-ray : Ct Angio chest- 08/01/23  EKG : 02/11/24  Monitor- 06/29/23  Echo : 06/30/23  Stress test: Cardiac Cath :   Activity level:  Sleep Study/ CPAP : Fasting Blood Sugar :      / Checks Blood Sugar -- times a day:     DM- type  Hgba1c-  Mounjaro - last dose on   Blood Thinner/ Instructions /Last Dose: ASA / Instructions/ Last Dose :    Xarelto - last dose on

## 2024-05-25 ENCOUNTER — Encounter: Payer: Self-pay | Admitting: Nurse Practitioner

## 2024-05-25 ENCOUNTER — Ambulatory Visit: Admitting: Nurse Practitioner

## 2024-05-25 VITALS — BP 129/68 | HR 58 | Temp 97.5°F | Ht 72.0 in | Wt 277.0 lb

## 2024-05-25 DIAGNOSIS — E1169 Type 2 diabetes mellitus with other specified complication: Secondary | ICD-10-CM

## 2024-05-25 DIAGNOSIS — E669 Obesity, unspecified: Secondary | ICD-10-CM

## 2024-05-25 DIAGNOSIS — Z6837 Body mass index (BMI) 37.0-37.9, adult: Secondary | ICD-10-CM

## 2024-05-25 DIAGNOSIS — E66812 Obesity, class 2: Secondary | ICD-10-CM

## 2024-05-25 NOTE — H&P (View-Only) (Signed)
 Office: [phone]  /  Fax: [phone]  WEIGHT SUMMARY AND BIOMETRICS  Weight Lost Since Last Visit: 0lb  Weight Gained Since Last Visit: 3lb   Vitals Temp: (!) 97.5 F (36.4 C) BP: 129/68 Pulse Rate: (!) 58 SpO2: 98 %   Anthropometric Measurements Height: 6' (1.829 m) Weight: 277 lb (125.6 kg) BMI (Calculated): 37.56 Weight at Last Visit: 274lb Weight Lost Since Last Visit: 0lb Weight Gained Since Last Visit: 3lb Starting Weight: 339lb Total Weight Loss (lbs): 62 lb (28.1 kg)   Body Composition  Body Fat %: 40.9 % Fat Mass (lbs): 113.6 lbs Muscle Mass (lbs): 156 lbs Total Body Water  (lbs): 135.6 lbs Visceral Fat Rating : 24   Other Clinical Data Fasting: Yes Labs: No Today's Visit #: 28 Starting Date: 01/14/22     HPI  Chief Complaint: OBESITY  Geoffrey Robinson is here to discuss his progress with his obesity treatment plan. He is on the the Category 4 Plan and states he is following his eating plan approximately 50 % of the time. He states he is exercising 10-15 minutes 3-4 days per week.   Interval History:  Since last office visit he has gained 3 pounds.  He is scheduled for left knee surgery on Dec 11th.  He has stopped taking Mounjaro  and Vit D 2 weeks ago.  He is walking to stay active but is limited due to knee pain.  He is starting PT the Monday after his surgery.   His highest weight was 392 lbs.   Pharmacotherapy for weight loss: He is not currently taking medications for medical weight loss.     Previous pharmacotherapy for medical weight loss:  None   Bariatric surgery:  Patient has not had bariatric surgery.   Pharmacotherapy for DMT2:  He is currently taking Mounjaro  7.5mg  (took last Nov 19 or 20th-stopped due to upcoming surgery).  Denies side effects.   Last A1c was 6.7 CBGs: 153-193 (today was 193)  Episodes of hypoglycemia: Denies On Lisinopril  40mg  and Xarelto  20mg .  Last eye exam:  Nov 2025-saw a specialist due to visual  changes-told a spot in his retina-aware that he is taking Mounjaro  and doesn't feel due to taking Mounjaro .   He doesn't want to take Metformin   He is scheduled for a US  carotid B on 05/31/24  Lab Results  Component Value Date   HGBA1C 6.7 (H) 02/11/2024   HGBA1C 6.7 (H) 10/06/2023   HGBA1C 6.7 (H) 06/02/2023   Lab Results  Component Value Date   LDLCALC 101 (H) 10/06/2023   CREATININE 0.83 02/18/2024     PHYSICAL EXAM:  Blood pressure 129/68, pulse (!) 58, temperature (!) 97.5 F (36.4 C), height 6' (1.829 m), weight 277 lb (125.6 kg), SpO2 98%. Body mass index is 37.57 kg/m.  General: He is overweight, cooperative, alert, well developed, and in no acute distress. PSYCH: Has normal mood, affect and thought process.   Extremities: No edema.  Neurologic: No gross sensory or motor deficits. No tremors or fasciculations noted.    DIAGNOSTIC DATA REVIEWED:  BMET    Component Value Date/Time   NA 138 02/18/2024 0327   NA 144 10/06/2023 0934   K 4.1 02/18/2024 0327   CL 105 02/18/2024 0327   CO2 22 02/18/2024 0327   GLUCOSE 307 (H) 02/18/2024 0327   BUN 16 02/18/2024 0327   BUN 19 10/06/2023 0934   CREATININE 0.83 02/18/2024 0327   CREATININE 0.99 03/11/2016 0853   CALCIUM 8.4 (L) 02/18/2024 0327  GFRNONAA >60 02/18/2024 0327   GFRAA >60 08/15/2019 0820   Lab Results  Component Value Date   HGBA1C 6.7 (H) 02/11/2024   HGBA1C  04/10/2009    5.9 (NOTE) The ADA recommends the following therapeutic goal for glycemic control related to Hgb A1c measurement: Goal of therapy: <6.5 Hgb A1c  Reference: American Diabetes Association: Clinical Practice Recommendations 2010, Diabetes Care, 2010, 33: (Suppl  1).   Lab Results  Component Value Date   INSULIN  30.0 (H) 06/30/2022   INSULIN  27.1 (H) 01/14/2022   Lab Results  Component Value Date   TSH 0.70 04/21/2024   CBC    Component Value Date/Time   WBC 8.8 02/18/2024 0327   RBC 4.07 (L) 02/18/2024 0327   HGB  11.6 (L) 02/18/2024 0327   HGB 13.6 05/11/2023 0848   HCT 36.1 (L) 02/18/2024 0327   HCT 41.7 05/11/2023 0848   PLT 164 02/18/2024 0327   PLT 224 05/11/2023 0848   MCV 88.7 02/18/2024 0327   MCV 82 05/11/2023 0848   MCH 28.5 02/18/2024 0327   MCHC 32.1 02/18/2024 0327   RDW 13.6 02/18/2024 0327   RDW 15.2 05/11/2023 0848   Iron Studies No results found for: IRON, TIBC, FERRITIN, IRONPCTSAT Lipid Panel     Component Value Date/Time   CHOL 161 10/06/2023 0934   TRIG 174 (H) 10/06/2023 0934   HDL 29 (L) 10/06/2023 0934   CHOLHDL 4.6 04/11/2009 0451   VLDL 20 04/11/2009 0451   LDLCALC 101 (H) 10/06/2023 0934   Hepatic Function Panel     Component Value Date/Time   PROT 7.1 10/06/2023 0934   ALBUMIN 4.3 10/06/2023 0934   AST 21 10/06/2023 0934   ALT 26 10/06/2023 0934   ALKPHOS 87 10/06/2023 0934   BILITOT 0.4 10/06/2023 0934      Component Value Date/Time   TSH 0.70 04/21/2024 0947   Nutritional Lab Results  Component Value Date   VD25OH 36.6 10/06/2023   VD25OH 40.3 06/02/2023   VD25OH 43.1 02/24/2023     ASSESSMENT AND PLAN  TREATMENT PLAN FOR OBESITY:  Recommended Dietary Goals  Geoffrey Robinson is currently in the action stage of change. As such, his goal is to continue weight management plan. He has agreed to practicing portion control and making smarter food choices, such as increasing vegetables and decreasing simple carbohydrates.  Behavioral Intervention  We discussed the following Behavioral Modification Strategies today: increasing lean protein intake to established goals, decreasing simple carbohydrates , increasing vegetables, increasing fiber rich foods, increasing water  intake , work on meal planning and preparation, celebration eating strategies, continue to work on maintaining a reduced calorie state, getting the recommended amount of protein, incorporating whole foods, making healthy choices, staying well hydrated and practicing mindfulness when  eating., and increase protein intake, fibrous foods (25 grams per day for women, 30 grams for men) and water  to improve satiety and decrease hunger signals. .  Additional resources provided today: NA  Recommended Physical Activity Goals  He has agreed to exercise per PT and surgeons recommendation.     ASSOCIATED CONDITIONS ADDRESSED TODAY  Action/Plan  Type 2 diabetes mellitus in patient with obesity (HCC) Currently off Mounjaro  due to upcoming surgery.  Will have him reach out to me a couple weeks after surgery to let me know how he is doing.  We re add Mounjaro  based upon how he is doing  Obesity, Class II, BMI 35-39.9       He is scheduled for labs on  Friday.    Return in about 6 weeks (around 07/06/2024).Geoffrey Robinson He was informed of the importance of frequent follow up visits to maximize his success with intensive lifestyle modifications for his multiple health conditions.   ATTESTASTION STATEMENTS:  Reviewed by clinician on day of visit: allergies, medications, problem list, medical history, surgical history, family history, social history, and previous encounter notes.   I personally spent a total of 25 minutes in the care of the patient today including preparing to see the patient, getting/reviewing separately obtained history, performing a medically appropriate exam/evaluation, counseling and educating, documenting clinical information in the EHR, and coordinating care.    Geoffrey Robinson Geoffrey Robinson. Geoffrey Popowski FNP-C

## 2024-05-25 NOTE — Progress Notes (Signed)
 Office: [phone]  /  Fax: [phone]  WEIGHT SUMMARY AND BIOMETRICS  Weight Lost Since Last Visit: 0lb  Weight Gained Since Last Visit: 3lb   Vitals Temp: (!) 97.5 F (36.4 C) BP: 129/68 Pulse Rate: (!) 58 SpO2: 98 %   Anthropometric Measurements Height: 6' (1.829 m) Weight: 277 lb (125.6 kg) BMI (Calculated): 37.56 Weight at Last Visit: 274lb Weight Lost Since Last Visit: 0lb Weight Gained Since Last Visit: 3lb Starting Weight: 339lb Total Weight Loss (lbs): 62 lb (28.1 kg)   Body Composition  Body Fat %: 40.9 % Fat Mass (lbs): 113.6 lbs Muscle Mass (lbs): 156 lbs Total Body Water  (lbs): 135.6 lbs Visceral Fat Rating : 24   Other Clinical Data Fasting: Yes Labs: No Today's Visit #: 28 Starting Date: 01/14/22     HPI  Chief Complaint: OBESITY  Geoffrey Robinson is here to discuss his progress with his obesity treatment plan. He is on the the Category 4 Plan and states he is following his eating plan approximately 50 % of the time. He states he is exercising 10-15 minutes 3-4 days per week.   Interval History:  Since last office visit he has gained 3 pounds.  He is scheduled for left knee surgery on Dec 11th.  He has stopped taking Mounjaro  and Vit D 2 weeks ago.  He is walking to stay active but is limited due to knee pain.  He is starting PT the Monday after his surgery.   His highest weight was 392 lbs.   Pharmacotherapy for weight loss: He is not currently taking medications for medical weight loss.     Previous pharmacotherapy for medical weight loss:  None   Bariatric surgery:  Patient has not had bariatric surgery.   Pharmacotherapy for DMT2:  He is currently taking Mounjaro  7.5mg  (took last Nov 19 or 20th-stopped due to upcoming surgery).  Denies side effects.   Last A1c was 6.7 CBGs: 153-193 (today was 193)  Episodes of hypoglycemia: Denies On Lisinopril  40mg  and Xarelto  20mg .  Last eye exam:  Nov 2025-saw a specialist due to visual  changes-told a spot in his retina-aware that he is taking Mounjaro  and doesn't feel due to taking Mounjaro .   He doesn't want to take Metformin   He is scheduled for a US  carotid B on 05/31/24  Lab Results  Component Value Date   HGBA1C 6.7 (H) 02/11/2024   HGBA1C 6.7 (H) 10/06/2023   HGBA1C 6.7 (H) 06/02/2023   Lab Results  Component Value Date   LDLCALC 101 (H) 10/06/2023   CREATININE 0.83 02/18/2024     PHYSICAL EXAM:  Blood pressure 129/68, pulse (!) 58, temperature (!) 97.5 F (36.4 C), height 6' (1.829 m), weight 277 lb (125.6 kg), SpO2 98%. Body mass index is 37.57 kg/m.  General: He is overweight, cooperative, alert, well developed, and in no acute distress. PSYCH: Has normal mood, affect and thought process.   Extremities: No edema.  Neurologic: No gross sensory or motor deficits. No tremors or fasciculations noted.    DIAGNOSTIC DATA REVIEWED:  BMET    Component Value Date/Time   NA 138 02/18/2024 0327   NA 144 10/06/2023 0934   K 4.1 02/18/2024 0327   CL 105 02/18/2024 0327   CO2 22 02/18/2024 0327   GLUCOSE 307 (H) 02/18/2024 0327   BUN 16 02/18/2024 0327   BUN 19 10/06/2023 0934   CREATININE 0.83 02/18/2024 0327   CREATININE 0.99 03/11/2016 0853   CALCIUM 8.4 (L) 02/18/2024 0327  GFRNONAA >60 02/18/2024 0327   GFRAA >60 08/15/2019 0820   Lab Results  Component Value Date   HGBA1C 6.7 (H) 02/11/2024   HGBA1C  04/10/2009    5.9 (NOTE) The ADA recommends the following therapeutic goal for glycemic control related to Hgb A1c measurement: Goal of therapy: <6.5 Hgb A1c  Reference: American Diabetes Association: Clinical Practice Recommendations 2010, Diabetes Care, 2010, 33: (Suppl  1).   Lab Results  Component Value Date   INSULIN  30.0 (H) 06/30/2022   INSULIN  27.1 (H) 01/14/2022   Lab Results  Component Value Date   TSH 0.70 04/21/2024   CBC    Component Value Date/Time   WBC 8.8 02/18/2024 0327   RBC 4.07 (L) 02/18/2024 0327   HGB  11.6 (L) 02/18/2024 0327   HGB 13.6 05/11/2023 0848   HCT 36.1 (L) 02/18/2024 0327   HCT 41.7 05/11/2023 0848   PLT 164 02/18/2024 0327   PLT 224 05/11/2023 0848   MCV 88.7 02/18/2024 0327   MCV 82 05/11/2023 0848   MCH 28.5 02/18/2024 0327   MCHC 32.1 02/18/2024 0327   RDW 13.6 02/18/2024 0327   RDW 15.2 05/11/2023 0848   Iron Studies No results found for: IRON, TIBC, FERRITIN, IRONPCTSAT Lipid Panel     Component Value Date/Time   CHOL 161 10/06/2023 0934   TRIG 174 (H) 10/06/2023 0934   HDL 29 (L) 10/06/2023 0934   CHOLHDL 4.6 04/11/2009 0451   VLDL 20 04/11/2009 0451   LDLCALC 101 (H) 10/06/2023 0934   Hepatic Function Panel     Component Value Date/Time   PROT 7.1 10/06/2023 0934   ALBUMIN 4.3 10/06/2023 0934   AST 21 10/06/2023 0934   ALT 26 10/06/2023 0934   ALKPHOS 87 10/06/2023 0934   BILITOT 0.4 10/06/2023 0934      Component Value Date/Time   TSH 0.70 04/21/2024 0947   Nutritional Lab Results  Component Value Date   VD25OH 36.6 10/06/2023   VD25OH 40.3 06/02/2023   VD25OH 43.1 02/24/2023     ASSESSMENT AND PLAN  TREATMENT PLAN FOR OBESITY:  Recommended Dietary Goals  Geoffrey Robinson is currently in the action stage of change. As such, his goal is to continue weight management plan. He has agreed to practicing portion control and making smarter food choices, such as increasing vegetables and decreasing simple carbohydrates.  Behavioral Intervention  We discussed the following Behavioral Modification Strategies today: increasing lean protein intake to established goals, decreasing simple carbohydrates , increasing vegetables, increasing fiber rich foods, increasing water  intake , work on meal planning and preparation, celebration eating strategies, continue to work on maintaining a reduced calorie state, getting the recommended amount of protein, incorporating whole foods, making healthy choices, staying well hydrated and practicing mindfulness when  eating., and increase protein intake, fibrous foods (25 grams per day for women, 30 grams for men) and water  to improve satiety and decrease hunger signals. .  Additional resources provided today: NA  Recommended Physical Activity Goals  He has agreed to exercise per PT and surgeons recommendation.     ASSOCIATED CONDITIONS ADDRESSED TODAY  Action/Plan  Type 2 diabetes mellitus in patient with obesity (HCC) Currently off Mounjaro  due to upcoming surgery.  Will have him reach out to me a couple weeks after surgery to let me know how he is doing.  We re add Mounjaro  based upon how he is doing  Obesity, Class II, BMI 35-39.9       He is scheduled for labs on  Friday.    Return in about 6 weeks (around 07/06/2024).SABRA He was informed of the importance of frequent follow up visits to maximize his success with intensive lifestyle modifications for his multiple health conditions.   ATTESTASTION STATEMENTS:  Reviewed by clinician on day of visit: allergies, medications, problem list, medical history, surgical history, family history, social history, and previous encounter notes.   I personally spent a total of 25 minutes in the care of the patient today including preparing to see the patient, getting/reviewing separately obtained history, performing a medically appropriate exam/evaluation, counseling and educating, documenting clinical information in the EHR, and coordinating care.    Corean SAUNDERS. Caton Popowski FNP-C

## 2024-05-26 ENCOUNTER — Encounter (HOSPITAL_COMMUNITY): Payer: Self-pay

## 2024-05-26 ENCOUNTER — Encounter (HOSPITAL_COMMUNITY)
Admission: RE | Admit: 2024-05-26 | Discharge: 2024-05-26 | Disposition: A | Source: Ambulatory Visit | Attending: Orthopedic Surgery

## 2024-05-26 ENCOUNTER — Other Ambulatory Visit: Payer: Self-pay

## 2024-05-26 DIAGNOSIS — Z01818 Encounter for other preprocedural examination: Secondary | ICD-10-CM

## 2024-05-26 HISTORY — DX: Unspecified atrial fibrillation: I48.91

## 2024-05-26 HISTORY — DX: Hypothyroidism, unspecified: E03.9

## 2024-05-26 LAB — BASIC METABOLIC PANEL WITH GFR
Anion gap: 12 (ref 5–15)
BUN: 20 mg/dL (ref 6–20)
CO2: 24 mmol/L (ref 22–32)
Calcium: 9.2 mg/dL (ref 8.9–10.3)
Chloride: 105 mmol/L (ref 98–111)
Creatinine, Ser: 0.71 mg/dL (ref 0.61–1.24)
GFR, Estimated: >60 mL/min (ref >60)
Glucose, Bld: 203 mg/dL — ABNORMAL HIGH (ref 70–99)
Potassium: 4.4 mmol/L (ref 3.5–5.1)
Sodium: 140 mmol/L (ref 135–145)

## 2024-05-26 LAB — SURGICAL PCR SCREEN
MRSA, PCR: NEGATIVE
Staphylococcus aureus: POSITIVE — AB

## 2024-05-26 LAB — CBC
HCT: 41.3 % (ref 39.0–52.0)
Hemoglobin: 12.6 g/dL — ABNORMAL LOW (ref 13.0–17.0)
MCH: 26.7 pg (ref 26.0–34.0)
MCHC: 30.5 g/dL (ref 30.0–36.0)
MCV: 87.5 fL (ref 80.0–100.0)
Platelets: 216 K/uL (ref 150–400)
RBC: 4.72 MIL/uL (ref 4.22–5.81)
RDW: 13.9 % (ref 11.5–15.5)
WBC: 6.2 K/uL (ref 4.0–10.5)
nRBC: 0 % (ref 0.0–0.2)

## 2024-05-26 LAB — GLUCOSE, CAPILLARY: Glucose-Capillary: 211 mg/dL — ABNORMAL HIGH (ref 70–99)

## 2024-05-27 LAB — HEMOGLOBIN A1C
Hgb A1c MFr Bld: 7.3 % — ABNORMAL HIGH (ref 4.8–5.6)
Mean Plasma Glucose: 163 mg/dL

## 2024-05-29 NOTE — Progress Notes (Signed)
 Case: [id number] Date/Time: 06/01/24 1120   Procedure: ARTHROPLASTY, KNEE, TOTAL, USING IMAGELESS COMPUTER-ASSISTED NAVIGATION (Left: Knee)   Anesthesia type: Spinal   Diagnosis: Primary osteoarthritis of left knee [M17.12]   Pre-op diagnosis: Left knee osteoarthritis   Location: [facility] ROOM 09 / [facility]   Surgeons: Fidel Rogue, MD       DISCUSSION: Geoffrey Robinson is a 63 yo male with PMH of HTN, NICM (with recovered EF), A.fib on Xarelto , TAA (41mm), severe OSA (uses CPAP), GERD, T2DM (A1c 6.7), Graves disease, arthritis.  Patient underwent R TKA on 8/28. Had spinal anesthesia without complications.   Pt follows with Cardiology for PAF on Xarelto  and hx of NICM (echo in 06/2023 with LVEF 50-55). He takes Digoxin  since he cannot take Amiodarone  due to thyroid  disease. Also on Metoprolol . Last seen in EP clinic on 07/07/2023. Noted to be doing well. Advised f/u in 1 year. Cardiac clearance scanned in media on 04/19/24 that patient is cleared and ok to hold Xarelto  for 3 days.    Pt follows with Endocrine for Graves disease vs iatrogenic from prior amiodarone  use. Last TSH was .70 on 04/21/24. Last seen on 04/21/24 and his methimazole  was discontinued.    LD Xarelto : 12/6 LD Mounjaro : 11/20    VS: BP (!) 146/90   Pulse (!) 57   Temp 37 C (Oral)   Resp 16   Ht 6' (1.829 m)   Wt 127 kg   SpO2 100%   BMI 37.97 kg/m   PROVIDERS: Theophilus Andrews, Tully GRADE, MD   LABS: Labs reviewed: Acceptable for surgery. (all labs ordered are listed, but only abnormal results are displayed)  Labs Reviewed  SURGICAL PCR SCREEN - Abnormal; Notable for the following components:      Result Value   Staphylococcus aureus POSITIVE (*)    All other components within normal limits  HEMOGLOBIN A1C - Abnormal; Notable for the following components:   Hgb A1c MFr Bld 7.3 (*)    All other components within normal limits  CBC - Abnormal; Notable for the following components:   Hemoglobin 12.6 (*)    All  other components within normal limits  BASIC METABOLIC PANEL WITH GFR - Abnormal; Notable for the following components:   Glucose, Bld 203 (*)    All other components within normal limits  GLUCOSE, CAPILLARY - Abnormal; Notable for the following components:   Glucose-Capillary 211 (*)    All other components within normal limits    IMAGES: CTA chest 07/29/23:   IMPRESSION: 1. 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 Continue annual imaging followup by CTA. This recommendation follows 2010 ACCF/AHA/AATS/ACR/ASA/SCA/SCAI/SIR/STS/SVM Guidelines for the Diagnosis and Management of Patients with Thoracic Aortic Disease. Circulation. 2010; 121: Z733-z630. Aortic aneurysm NOS (ICD10-I71.9) 2. 2.2 cm LEFT thyroid  nodule. Recommend routine US  Thyroid  for further evaluation. Reference: J Am Coll Radiol. 2015 Feb;12(2): 143-50   EKG 02/11/24:   Sinus bradycardia, rate 55 Right bundle branch block Abnormal ECG When compared with ECG of 07-Jan-2023 15:54, now in sinus   CV:   Echo 06/30/23:   IMPRESSIONS      1. Left ventricular ejection fraction, by estimation, is 50 to 55%. The left ventricle has low normal function. The left ventricle has no regional wall motion abnormalities. There is mild left ventricular hypertrophy. Left ventricular diastolic parameters are indeterminate.  2. Right ventricular systolic function is normal. The right ventricular size is normal. Tricuspid regurgitation signal is inadequate for assessing PA pressure.  3. The  mitral valve is normal in structure. Trivial mitral valve regurgitation. No evidence of mitral stenosis.  4. The aortic valve is tricuspid. Aortic valve regurgitation is trivial. No aortic stenosis is present.  5. Aortic dilatation noted. There is dilatation of the aortic root, measuring 40 mm. There is dilatation of the ascending aorta, measuring 43 mm.   Event monitor 06/22/23:   Atrial fib with a slow VR (48/min),  CVR (ave HR 77/min) and RVR (115/min) No prolonged pauses. No VT Rare PVC's.  Symptoms submitted show atrial fib with a CVR. Patch Wear Time:  2 days and 21 hours   Past Medical History:  Diagnosis Date   A-fib [facility])    Arthritis    Ascending aorta dilatation    41mm by CT>>normal when indexed for BSA   Back pain    Bradycardia 10/21/2015   Diabetes mellitus without complication (HCC)    Dyslipidemia    Heart burn    High cholesterol    HTN (hypertension)    Hypothyroidism    Joint pain    NICM (nonischemic cardiomyopathy) (HCC)    a. LHC 2002: no CAD, EF 35-40%; b. Echo 2003: EF 60% - tachycardia mediated (AFlutter with RVR).  EF 50% on echo 11/2020   Obesity    Palpitation    Persistent atrial fibrillation (HCC)    CHADS2VASC score is 2.  s/p remote afib and aflutter ablations   Severe obstructive sleep apnea    , CPAP at 14cm H2O   Swelling of lower extremity    Varicose veins     Past Surgical History:  Procedure Laterality Date   afib ablation      BIOPSY THYROID      10/2023   CARDIAC SURGERY     CARDIOVERSION N/A 09/07/2019   Procedure: CARDIOVERSION;  Surgeon: Loni Soyla LABOR, MD;  Location: [facility] ENDOSCOPY;  Service: Cardiovascular;  Laterality: N/A;   COLONOSCOPY     KNEE ARTHROPLASTY Right 02/17/2024   Procedure: ARTHROPLASTY, KNEE, TOTAL, USING IMAGELESS COMPUTER-ASSISTED NAVIGATION;  Surgeon: Fidel Rogue, MD;  Location: [facility];  Service: Orthopedics;  Laterality: Right;   right knee surgery      VARICOSE VEIN SURGERY      MEDICATIONS:  acetaminophen  (TYLENOL ) 500 MG tablet   digoxin  (LANOXIN ) 0.125 MG tablet   diltiazem  (CARDIZEM  CD) 360 MG 24 hr capsule   fenofibrate  (TRICOR ) 145 MG tablet   finasteride  (PROSCAR ) 5 MG tablet   furosemide  (LASIX ) 20 MG tablet   lisinopril  (ZESTRIL ) 40 MG tablet   methimazole  (TAPAZOLE ) 5 MG tablet   metoprolol  tartrate (LOPRESSOR ) 100 MG tablet   omeprazole  (PRILOSEC) 40 MG capsule   tamsulosin  (FLOMAX ) 0.4 MG  CAPS capsule   tirzepatide  (MOUNJARO ) 7.5 MG/0.5ML Pen   Vitamin D , Ergocalciferol , (DRISDOL ) 1.25 MG (50000 UNIT) CAPS capsule   XARELTO  20 MG TABS tablet   No current facility-administered medications for this encounter.    Burnard CHRISTELLA Odis DEVONNA [facility] Short Stay/Anesthesiology [facility] Phone [phone] 05/29/2024 9:27 AM

## 2024-05-29 NOTE — Anesthesia Preprocedure Evaluation (Signed)
 Anesthesia Evaluation    Airway        Dental   Pulmonary           Cardiovascular hypertension,      Neuro/Psych    GI/Hepatic   Endo/Other  diabetes    Renal/GU      Musculoskeletal   Abdominal   Peds  Hematology   Anesthesia Other Findings   Reproductive/Obstetrics                              Anesthesia Physical Anesthesia Plan  ASA:   Anesthesia Plan:    Post-op Pain Management:    Induction:   PONV Risk Score and Plan:   Airway Management Planned:   Additional Equipment:   Intra-op Plan:   Post-operative Plan:   Informed Consent:   Plan Discussed with:   Anesthesia Plan Comments: (See PAT note from 12/5 )         Anesthesia Quick Evaluation

## 2024-05-31 ENCOUNTER — Inpatient Hospital Stay: Admission: RE | Admit: 2024-05-31 | Discharge: 2024-05-31 | Attending: Ophthalmology | Admitting: Ophthalmology

## 2024-05-31 DIAGNOSIS — H353112 Nonexudative age-related macular degeneration, right eye, intermediate dry stage: Secondary | ICD-10-CM

## 2024-05-31 DIAGNOSIS — H353121 Nonexudative age-related macular degeneration, left eye, early dry stage: Secondary | ICD-10-CM

## 2024-06-01 ENCOUNTER — Encounter (HOSPITAL_COMMUNITY): Payer: Self-pay | Admitting: Medical

## 2024-06-01 ENCOUNTER — Other Ambulatory Visit: Payer: Self-pay

## 2024-06-01 ENCOUNTER — Encounter (HOSPITAL_COMMUNITY): Payer: Self-pay | Admitting: Orthopedic Surgery

## 2024-06-01 ENCOUNTER — Ambulatory Visit (HOSPITAL_COMMUNITY)
Admission: RE | Admit: 2024-06-01 | Discharge: 2024-06-02 | Disposition: A | Attending: Orthopedic Surgery | Admitting: Orthopedic Surgery

## 2024-06-01 ENCOUNTER — Ambulatory Visit (HOSPITAL_COMMUNITY): Admitting: Anesthesiology

## 2024-06-01 ENCOUNTER — Encounter (HOSPITAL_COMMUNITY): Admission: RE | Disposition: A | Payer: Self-pay | Source: Ambulatory Visit | Attending: Orthopedic Surgery

## 2024-06-01 ENCOUNTER — Ambulatory Visit (HOSPITAL_COMMUNITY)

## 2024-06-01 DIAGNOSIS — M1712 Unilateral primary osteoarthritis, left knee: Secondary | ICD-10-CM

## 2024-06-01 DIAGNOSIS — I509 Heart failure, unspecified: Secondary | ICD-10-CM | POA: Diagnosis not present

## 2024-06-01 DIAGNOSIS — I110 Hypertensive heart disease with heart failure: Secondary | ICD-10-CM

## 2024-06-01 DIAGNOSIS — I4819 Other persistent atrial fibrillation: Secondary | ICD-10-CM

## 2024-06-01 DIAGNOSIS — Z6837 Body mass index (BMI) 37.0-37.9, adult: Secondary | ICD-10-CM | POA: Insufficient documentation

## 2024-06-01 DIAGNOSIS — E0590 Thyrotoxicosis, unspecified without thyrotoxic crisis or storm: Secondary | ICD-10-CM | POA: Insufficient documentation

## 2024-06-01 DIAGNOSIS — E66812 Obesity, class 2: Secondary | ICD-10-CM | POA: Insufficient documentation

## 2024-06-01 DIAGNOSIS — G4733 Obstructive sleep apnea (adult) (pediatric): Secondary | ICD-10-CM | POA: Insufficient documentation

## 2024-06-01 DIAGNOSIS — E1169 Type 2 diabetes mellitus with other specified complication: Secondary | ICD-10-CM | POA: Insufficient documentation

## 2024-06-01 DIAGNOSIS — E039 Hypothyroidism, unspecified: Secondary | ICD-10-CM | POA: Insufficient documentation

## 2024-06-01 DIAGNOSIS — M8580 Other specified disorders of bone density and structure, unspecified site: Secondary | ICD-10-CM | POA: Insufficient documentation

## 2024-06-01 DIAGNOSIS — Z96652 Presence of left artificial knee joint: Secondary | ICD-10-CM

## 2024-06-01 DIAGNOSIS — Z01818 Encounter for other preprocedural examination: Secondary | ICD-10-CM

## 2024-06-01 DIAGNOSIS — K219 Gastro-esophageal reflux disease without esophagitis: Secondary | ICD-10-CM | POA: Insufficient documentation

## 2024-06-01 DIAGNOSIS — I77819 Aortic ectasia, unspecified site: Secondary | ICD-10-CM | POA: Insufficient documentation

## 2024-06-01 DIAGNOSIS — R319 Hematuria, unspecified: Secondary | ICD-10-CM | POA: Insufficient documentation

## 2024-06-01 HISTORY — PX: KNEE ARTHROPLASTY: SHX992

## 2024-06-01 LAB — GLUCOSE, CAPILLARY
Glucose-Capillary: 229 mg/dL — ABNORMAL HIGH (ref 70–99)
Glucose-Capillary: 155 mg/dL — ABNORMAL HIGH (ref 70–99)
Glucose-Capillary: 162 mg/dL — ABNORMAL HIGH (ref 70–99)
Glucose-Capillary: 208 mg/dL — ABNORMAL HIGH (ref 70–99)
Glucose-Capillary: 218 mg/dL — ABNORMAL HIGH (ref 70–99)

## 2024-06-01 SURGERY — ARTHROPLASTY, KNEE, TOTAL, USING IMAGELESS COMPUTER-ASSISTED NAVIGATION
Anesthesia: Spinal | Site: Knee | Laterality: Left

## 2024-06-01 MED ORDER — DEXAMETHASONE SOD PHOSPHATE PF 10 MG/ML IJ SOLN
INTRAMUSCULAR | Status: DC | PRN
  Administered 2024-06-01: 8 mg via INTRAVENOUS

## 2024-06-01 MED ORDER — RIVAROXABAN 10 MG PO TABS
10.0000 mg | ORAL_TABLET | Freq: Every day | ORAL | Status: DC
  Administered 2024-06-02: 10 mg via ORAL
  Filled 2024-06-01: qty 1

## 2024-06-01 MED ORDER — SODIUM CHLORIDE (PF) 0.9 % IJ SOLN
INTRAMUSCULAR | Status: DC | PRN
  Administered 2024-06-01: 61 mL

## 2024-06-01 MED ORDER — FENTANYL CITRATE (PF) 50 MCG/ML IJ SOSY
PREFILLED_SYRINGE | INTRAMUSCULAR | Status: AC
  Administered 2024-06-01: 50 ug via INTRAVENOUS
  Filled 2024-06-01: qty 2

## 2024-06-01 MED ORDER — ACETAMINOPHEN 500 MG PO TABS
1000.0000 mg | ORAL_TABLET | Freq: Four times a day (QID) | ORAL | Status: AC
  Administered 2024-06-01 – 2024-06-02 (×4): 1000 mg via ORAL
  Filled 2024-06-01 (×4): qty 2

## 2024-06-01 MED ORDER — FENOFIBRATE 160 MG PO TABS
160.0000 mg | ORAL_TABLET | Freq: Every day | ORAL | Status: DC
  Filled 2024-06-01: qty 1

## 2024-06-01 MED ORDER — BISACODYL 10 MG RE SUPP: 10.0000 mg | Freq: Every day | RECTAL | Status: DC | PRN

## 2024-06-01 MED ORDER — CHLORHEXIDINE GLUCONATE 4 % EX SOLN: 1.0000 | CUTANEOUS | 1 refills | Status: AC

## 2024-06-01 MED ORDER — DILTIAZEM HCL ER COATED BEADS 180 MG PO CP24
360.0000 mg | ORAL_CAPSULE | Freq: Every day | ORAL | Status: DC
  Administered 2024-06-02: 360 mg via ORAL
  Filled 2024-06-01: qty 2

## 2024-06-01 MED ORDER — CEFAZOLIN SODIUM-DEXTROSE 2-4 GM/100ML-% IV SOLN: 2.0000 g | INTRAVENOUS | Status: DC

## 2024-06-01 MED ORDER — DEXAMETHASONE SODIUM PHOSPHATE 4 MG/ML IJ SOLN: INTRAMUSCULAR | Status: DC | PRN

## 2024-06-01 MED ORDER — METOPROLOL TARTRATE 50 MG PO TABS
150.0000 mg | ORAL_TABLET | Freq: Every day | ORAL | Status: DC
  Administered 2024-06-01: 150 mg via ORAL
  Filled 2024-06-01: qty 3

## 2024-06-01 MED ORDER — METOCLOPRAMIDE HCL 5 MG/ML IJ SOLN: 5.0000 mg | Freq: Three times a day (TID) | INTRAMUSCULAR | Status: DC | PRN

## 2024-06-01 MED ORDER — ACETAMINOPHEN 500 MG PO TABS
1000.0000 mg | ORAL_TABLET | Freq: Once | ORAL | Status: AC
  Administered 2024-06-01: 1000 mg via ORAL
  Filled 2024-06-01: qty 2

## 2024-06-01 MED ORDER — PHENYLEPHRINE HCL-NACL 20-0.9 MG/250ML-% IV SOLN
INTRAVENOUS | Status: DC | PRN
  Administered 2024-06-01: 25 ug/min via INTRAVENOUS

## 2024-06-01 MED ORDER — CHLORHEXIDINE GLUCONATE 0.12 % MT SOLN: 15.0000 mL | Freq: Once | OROMUCOSAL | Status: DC

## 2024-06-01 MED ORDER — POVIDONE-IODINE 10 % EX SWAB: 2.0000 | Freq: Once | CUTANEOUS | Status: DC

## 2024-06-01 MED ORDER — ALUM & MAG HYDROXIDE-SIMETH 200-200-20 MG/5ML PO SUSP: 30.0000 mL | ORAL | Status: DC | PRN

## 2024-06-01 MED ORDER — POLYETHYLENE GLYCOL 3350 17 G PO PACK: 17.0000 g | PACK | Freq: Every day | ORAL | Status: DC | PRN

## 2024-06-01 MED ORDER — METHIMAZOLE 5 MG PO TABS
5.0000 mg | ORAL_TABLET | ORAL | Status: DC
  Administered 2024-06-02: 5 mg via ORAL
  Filled 2024-06-01: qty 1

## 2024-06-01 MED ORDER — DIPHENHYDRAMINE HCL 12.5 MG/5ML PO ELIX: 12.5000 mg | ORAL_SOLUTION | ORAL | Status: DC | PRN

## 2024-06-01 MED ORDER — INSULIN ASPART 100 UNIT/ML IJ SOLN
0.0000 [IU] | INTRAMUSCULAR | Status: AC | PRN
  Administered 2024-06-01: 2 [IU] via SUBCUTANEOUS
  Administered 2024-06-01: 6 [IU] via SUBCUTANEOUS
  Filled 2024-06-01: qty 2
  Filled 2024-06-01: qty 6

## 2024-06-01 MED ORDER — 0.9 % SODIUM CHLORIDE (POUR BTL) OPTIME
TOPICAL | Status: DC | PRN
  Administered 2024-06-01: 1000 mL

## 2024-06-01 MED ORDER — DIGOXIN 125 MCG PO TABS
125.0000 ug | ORAL_TABLET | Freq: Every day | ORAL | Status: DC
  Administered 2024-06-02: 125 ug via ORAL
  Filled 2024-06-01: qty 1

## 2024-06-01 MED ORDER — ONDANSETRON HCL 4 MG PO TABS: 4.0000 mg | ORAL_TABLET | Freq: Four times a day (QID) | ORAL | Status: DC | PRN

## 2024-06-01 MED ORDER — MENTHOL 3 MG MT LOZG: 1.0000 | LOZENGE | OROMUCOSAL | Status: DC | PRN

## 2024-06-01 MED ORDER — TRANEXAMIC ACID-NACL 1000-0.7 MG/100ML-% IV SOLN
1000.0000 mg | INTRAVENOUS | Status: AC
  Administered 2024-06-01: 1000 mg via INTRAVENOUS

## 2024-06-01 MED ORDER — FENTANYL CITRATE (PF) 50 MCG/ML IJ SOSY: 100.0000 ug | PREFILLED_SYRINGE | INTRAMUSCULAR | Status: DC

## 2024-06-01 MED ORDER — PANTOPRAZOLE SODIUM 40 MG PO TBEC
40.0000 mg | DELAYED_RELEASE_TABLET | Freq: Every day | ORAL | Status: DC
  Administered 2024-06-02: 40 mg via ORAL
  Filled 2024-06-01: qty 1

## 2024-06-01 MED ORDER — ISOPROPYL ALCOHOL 70 % SOLN
Status: DC | PRN
  Administered 2024-06-01: 1 via TOPICAL

## 2024-06-01 MED ORDER — FINASTERIDE 5 MG PO TABS
5.0000 mg | ORAL_TABLET | Freq: Every day | ORAL | Status: DC
  Filled 2024-06-01: qty 1

## 2024-06-01 MED ORDER — OXYCODONE HCL 5 MG PO TABS: 10.0000 mg | ORAL_TABLET | ORAL | Status: DC | PRN

## 2024-06-01 MED ORDER — OXYCODONE HCL 5 MG PO TABS
5.0000 mg | ORAL_TABLET | ORAL | Status: DC | PRN
  Administered 2024-06-01 – 2024-06-02 (×2): 5 mg via ORAL
  Filled 2024-06-01 (×3): qty 1

## 2024-06-01 MED ORDER — METHOCARBAMOL 500 MG PO TABS
500.0000 mg | ORAL_TABLET | Freq: Four times a day (QID) | ORAL | Status: DC | PRN
  Filled 2024-06-01: qty 1

## 2024-06-01 MED ORDER — INSULIN ASPART 100 UNIT/ML IJ SOLN
0.0000 [IU] | Freq: Three times a day (TID) | INTRAMUSCULAR | Status: DC
  Administered 2024-06-01: 3 [IU] via SUBCUTANEOUS
  Administered 2024-06-02 (×2): 2 [IU] via SUBCUTANEOUS
  Filled 2024-06-01 (×3): qty 2

## 2024-06-01 MED ORDER — HYDROMORPHONE HCL 1 MG/ML IJ SOLN: 0.5000 mg | INTRAMUSCULAR | Status: DC | PRN

## 2024-06-01 MED ORDER — LACTATED RINGERS IV SOLN: INTRAVENOUS | Status: DC | PRN

## 2024-06-01 MED ORDER — MIDAZOLAM HCL (PF) 2 MG/2ML IJ SOLN: 2.0000 mg | INTRAMUSCULAR | Status: DC

## 2024-06-01 MED ORDER — BUPIVACAINE IN DEXTROSE 0.75-8.25 % IT SOLN
INTRATHECAL | Status: DC | PRN
  Administered 2024-06-01: 1.8 mL via INTRATHECAL

## 2024-06-01 MED ORDER — METHOCARBAMOL 1000 MG/10ML IJ SOLN: 500.0000 mg | Freq: Four times a day (QID) | INTRAMUSCULAR | Status: DC | PRN

## 2024-06-01 MED ORDER — SODIUM CHLORIDE 0.9 % IV SOLN: INTRAVENOUS | Status: DC

## 2024-06-01 MED ORDER — SENNA 8.6 MG PO TABS
1.0000 | ORAL_TABLET | Freq: Two times a day (BID) | ORAL | Status: DC
  Administered 2024-06-01 – 2024-06-02 (×2): 8.6 mg via ORAL
  Filled 2024-06-01 (×2): qty 1

## 2024-06-01 MED ORDER — PHENOL 1.4 % MT LIQD: 1.0000 | OROMUCOSAL | Status: DC | PRN

## 2024-06-01 MED ORDER — LACTATED RINGERS IV SOLN: INTRAVENOUS | Status: DC

## 2024-06-01 MED ORDER — KETOROLAC TROMETHAMINE 30 MG/ML IJ SOLN
INTRAMUSCULAR | Status: AC
  Filled 2024-06-01: qty 1

## 2024-06-01 MED ORDER — ROPIVACAINE HCL 5 MG/ML IJ SOLN
INTRAMUSCULAR | Status: DC | PRN
  Administered 2024-06-01: 20 mL via PERINEURAL

## 2024-06-01 MED ORDER — DOCUSATE SODIUM 100 MG PO CAPS
100.0000 mg | ORAL_CAPSULE | Freq: Two times a day (BID) | ORAL | Status: DC
  Administered 2024-06-01 – 2024-06-02 (×2): 100 mg via ORAL
  Filled 2024-06-01 (×2): qty 1

## 2024-06-01 MED ORDER — ORAL CARE MOUTH RINSE: 15.0000 mL | Freq: Once | OROMUCOSAL | Status: DC

## 2024-06-01 MED ORDER — BUPIVACAINE-EPINEPHRINE (PF) 0.25% -1:200000 IJ SOLN
INTRAMUSCULAR | Status: AC
  Filled 2024-06-01: qty 30

## 2024-06-01 MED ORDER — ONDANSETRON HCL 4 MG/2ML IJ SOLN: 4.0000 mg | Freq: Four times a day (QID) | INTRAMUSCULAR | Status: DC | PRN

## 2024-06-01 MED ORDER — METOCLOPRAMIDE HCL 5 MG PO TABS: 5.0000 mg | ORAL_TABLET | Freq: Three times a day (TID) | ORAL | Status: DC | PRN

## 2024-06-01 MED ORDER — ONDANSETRON HCL 4 MG/2ML IJ SOLN
INTRAMUSCULAR | Status: DC | PRN
  Administered 2024-06-01: 4 mg via INTRAVENOUS

## 2024-06-01 MED ORDER — SODIUM CHLORIDE 0.9 % IR SOLN
Status: DC | PRN
  Administered 2024-06-01: 3000 mL

## 2024-06-01 MED ORDER — ACETAMINOPHEN 325 MG PO TABS: 325.0000 mg | ORAL_TABLET | Freq: Four times a day (QID) | ORAL | Status: DC | PRN

## 2024-06-01 MED ORDER — METOPROLOL TARTRATE 50 MG PO TABS
100.0000 mg | ORAL_TABLET | Freq: Every day | ORAL | Status: DC
  Administered 2024-06-02: 100 mg via ORAL
  Filled 2024-06-01: qty 2

## 2024-06-01 MED ORDER — SODIUM CHLORIDE (PF) 0.9 % IJ SOLN
INTRAMUSCULAR | Status: AC
  Filled 2024-06-01: qty 50

## 2024-06-01 MED ORDER — MUPIROCIN 2 % EX OINT: 1.0000 | TOPICAL_OINTMENT | Freq: Two times a day (BID) | CUTANEOUS | 0 refills | Status: AC

## 2024-06-01 MED ORDER — CEFAZOLIN SODIUM-DEXTROSE 2-4 GM/100ML-% IV SOLN
2.0000 g | Freq: Four times a day (QID) | INTRAVENOUS | Status: AC
  Administered 2024-06-01 – 2024-06-02 (×2): 2 g via INTRAVENOUS
  Filled 2024-06-01 (×2): qty 100

## 2024-06-01 MED ORDER — STERILE WATER FOR IRRIGATION IR SOLN
Status: DC | PRN
  Administered 2024-06-01: 2000 mL

## 2024-06-01 MED ORDER — MUPIROCIN 2 % EX OINT
TOPICAL_OINTMENT | Freq: Two times a day (BID) | CUTANEOUS | Status: DC
  Administered 2024-06-02: 1 via NASAL
  Filled 2024-06-01: qty 22

## 2024-06-01 MED ORDER — TAMSULOSIN HCL 0.4 MG PO CAPS
0.4000 mg | ORAL_CAPSULE | Freq: Every day | ORAL | Status: DC
  Administered 2024-06-01: 0.4 mg via ORAL
  Filled 2024-06-01: qty 1

## 2024-06-01 MED ORDER — INSULIN ASPART 100 UNIT/ML IJ SOLN
0.0000 [IU] | Freq: Every day | INTRAMUSCULAR | Status: DC
  Administered 2024-06-01: 2 [IU] via SUBCUTANEOUS
  Filled 2024-06-01: qty 2

## 2024-06-01 MED ORDER — TRANEXAMIC ACID-NACL 1000-0.7 MG/100ML-% IV SOLN
INTRAVENOUS | Status: AC
  Filled 2024-06-01: qty 100

## 2024-06-01 MED ORDER — CEFAZOLIN SODIUM-DEXTROSE 3-4 GM/150ML-% IV SOLN
3.0000 g | INTRAVENOUS | Status: AC
  Administered 2024-06-01: 3 g via INTRAVENOUS
  Filled 2024-06-01: qty 150

## 2024-06-01 MED ORDER — PROPOFOL 500 MG/50ML IV EMUL
INTRAVENOUS | Status: DC | PRN
  Administered 2024-06-01: 100 ug/kg/min via INTRAVENOUS

## 2024-06-01 SURGICAL SUPPLY — 58 items
BAG COUNTER SPONGE SURGICOUNT (BAG) IMPLANT
BAG ZIPLOCK 12X15 (MISCELLANEOUS) ×1 IMPLANT
BATTERY INSTRU NAVIGATION (MISCELLANEOUS) ×3 IMPLANT
BLADE SAGITTAL AGGR TOOTH XLG (BLADE) ×1 IMPLANT
BLADE SAW RECIPROCATING 77.5 (BLADE) ×1 IMPLANT
BLADE SAW SAG 35X64 .89 (BLADE) ×1 IMPLANT
BNDG ELASTIC 4INX 5YD STR LF (GAUZE/BANDAGES/DRESSINGS) ×1 IMPLANT
BNDG ELASTIC 6INX 5YD STR LF (GAUZE/BANDAGES/DRESSINGS) ×1 IMPLANT
CHLORAPREP W/TINT 26 (MISCELLANEOUS) ×2 IMPLANT
COMPONENT FEM PS STD 11 LT (Joint) IMPLANT
COMPONENT PATELLA 3 PEG 41 (Joint) IMPLANT
COMPONENT TIB KNEE H 0D LT (Joint) IMPLANT
COVER SURGICAL LIGHT HANDLE (MISCELLANEOUS) ×1 IMPLANT
DERMABOND ADVANCED .7 DNX12 (GAUZE/BANDAGES/DRESSINGS) ×2 IMPLANT
DRAPE SHEET LG 3/4 BI-LAMINATE (DRAPES) ×3 IMPLANT
DRAPE U-SHAPE 47X51 STRL (DRAPES) ×1 IMPLANT
DRSG AQUACEL AG ADV 3.5X10 (GAUZE/BANDAGES/DRESSINGS) ×1 IMPLANT
DRSG AQUACEL AG ADV 3.5X14 (GAUZE/BANDAGES/DRESSINGS) IMPLANT
ELECT BLADE TIP CTD 4 INCH (ELECTRODE) ×1 IMPLANT
ELECT PENCIL ROCKER SW 15FT (MISCELLANEOUS) ×1 IMPLANT
ELECT REM PT RETURN 15FT ADLT (MISCELLANEOUS) ×1 IMPLANT
GAUZE SPONGE 4X4 12PLY STRL (GAUZE/BANDAGES/DRESSINGS) ×1 IMPLANT
GLOVE BIO SURGEON STRL SZ7 (GLOVE) ×1 IMPLANT
GLOVE BIO SURGEON STRL SZ8.5 (GLOVE) ×2 IMPLANT
GLOVE BIOGEL PI IND STRL 7.5 (GLOVE) ×1 IMPLANT
GLOVE BIOGEL PI IND STRL 8.5 (GLOVE) ×1 IMPLANT
GOWN SPEC L3 XXLG W/TWL (GOWN DISPOSABLE) ×1 IMPLANT
GOWN STRL REUS W/ TWL XL LVL3 (GOWN DISPOSABLE) ×1 IMPLANT
HOLDER FOLEY CATH W/STRAP (MISCELLANEOUS) ×1 IMPLANT
HOOD PEEL AWAY T7 (MISCELLANEOUS) ×3 IMPLANT
KIT TURNOVER KIT A (KITS) ×1 IMPLANT
LINER TIB PS GH/7-12 12 LT (Liner) IMPLANT
MARKER SKIN DUAL TIP RULER LAB (MISCELLANEOUS) ×1 IMPLANT
NDL SAFETY ECLIPSE 18X1.5 (NEEDLE) ×1 IMPLANT
NDL SPNL 18GX3.5 QUINCKE PK (NEEDLE) ×1 IMPLANT
NS IRRIG 1000ML POUR BTL (IV SOLUTION) ×1 IMPLANT
PACK TOTAL KNEE CUSTOM (KITS) ×1 IMPLANT
PADDING CAST COTTON 6X4 STRL (CAST SUPPLIES) ×1 IMPLANT
PIN DRILL HDLS TROCAR 75 4PK (PIN) IMPLANT
PROTECTOR NERVE ULNAR (MISCELLANEOUS) ×1 IMPLANT
SCREW FEMALE HEX FIX 25X2.5 (ORTHOPEDIC DISPOSABLE SUPPLIES) IMPLANT
SEALER BIPOLAR AQUA 6.0 (INSTRUMENTS) ×1 IMPLANT
SET HNDPC FAN SPRY TIP SCT (DISPOSABLE) ×1 IMPLANT
SET PAD KNEE POSITIONER (MISCELLANEOUS) ×1 IMPLANT
SOLUTION PRONTOSAN WOUND 350ML (IRRIGATION / IRRIGATOR) ×1 IMPLANT
SPIKE FLUID TRANSFER (MISCELLANEOUS) ×2 IMPLANT
STAPLER SKIN PROX 35W (STAPLE) IMPLANT
SUT MNCRL AB 3-0 PS2 18 (SUTURE) ×1 IMPLANT
SUT MON AB 2-0 CT1 36 (SUTURE) ×1 IMPLANT
SUT STRATAFIX 14 PDO 48 VLT (SUTURE) ×1 IMPLANT
SUT VIC AB 1 CTX36XBRD ANBCTR (SUTURE) ×2 IMPLANT
SUT VIC AB 2-0 CT1 TAPERPNT 27 (SUTURE) ×1 IMPLANT
SYR 3ML LL SCALE MARK (SYRINGE) ×1 IMPLANT
TOWEL GREEN STERILE FF (TOWEL DISPOSABLE) ×1 IMPLANT
TRAY FOLEY MTR SLVR 16FR STAT (SET/KITS/TRAYS/PACK) ×1 IMPLANT
TUBE SUCTION HIGH CAP CLEAR NV (SUCTIONS) ×1 IMPLANT
WATER STERILE IRR 1000ML POUR (IV SOLUTION) ×2 IMPLANT
WRAP KNEE MAXI GEL POST OP (GAUZE/BANDAGES/DRESSINGS) ×1 IMPLANT

## 2024-06-01 NOTE — Anesthesia Procedure Notes (Signed)
 Anesthesia Regional Block: Adductor canal block   Pre-Anesthetic Checklist: , timeout performed,  Correct Patient, Correct Site, Correct Laterality,  Correct Procedure, Correct Position, site marked,  Risks and benefits discussed,  Surgical consent,  Pre-op evaluation,  At surgeon's request and post-op pain management  Laterality: Left  Prep: Maximum Sterile Barrier Precautions used, chloraprep       Needles:  Injection technique: Single-shot  Needle Type: Echogenic Needle      Needle Gauge: 20     Additional Needles:   Procedures:,,,, ultrasound used (permanent image in chart),,    Narrative:  Start time: 06/01/2024 10:22 AM End time: 06/01/2024 10:28 AM Injection made incrementally with aspirations every 5 mL.  Performed by: Personally  Anesthesiologist: Keneth Lynwood POUR, MD

## 2024-06-01 NOTE — Op Note (Signed)
 OPERATIVE REPORT  SURGEON: Redell Shoals, MD   ASSISTANT: Valery [name], PA-C  PREOPERATIVE DIAGNOSIS: Primary Left knee arthritis.   POSTOPERATIVE DIAGNOSIS: Primary Left knee arthritis.   PROCEDURE: Computer assisted Left total knee arthroplasty.  Removal of deep foreign body.  IMPLANTS: Zimmer Persona PPS Cementless CR femur, size 11. Persona 0 degree Spiked Keel OsseoTi Tibia, size H. Vivacit-E polyethelyene insert, size 12 mm, CR. OsseoTi 3-Peg patella, size 41 mm.  ANESTHESIA:  MAC, Regional, and Spinal  TOURNIQUET TIME: Not utilized.   ESTIMATED BLOOD LOSS: 350 mL.  ANTIBIOTICS: 3g Ancef .  DRAINS: None.  COMPLICATIONS: None   CONDITION: PACU - hemodynamically stable.   BRIEF CLINICAL NOTE: Geoffrey Robinson is a 63 y.o. male with a long-standing history of Left knee arthritis. After failing conservative management, the patient was indicated for total knee arthroplasty. The risks, benefits, and alternatives to the procedure were explained, and the patient elected to proceed.  PROCEDURE IN DETAIL: Adductor canal block was obtained in the pre-op holding area. Once inside the operative room, spinal anesthesia was obtained, and a foley catheter was inserted. The patient was then positioned and the lower extremity was prepped and draped in the normal sterile surgical fashion.  A time-out was called verifying side and site of surgery. The patient received IV antibiotics within 60 minutes of beginning the procedure. A tourniquet was not utilized.   An anterior approach to the knee was performed utilizing a midvastus arthrotomy. A medial release was performed and the patellar fat pad was excised. Stryker imageless navigation was used to cut the distal femur perpendicular to the mechanical axis. A freehand patellar resection was performed, and the patella was sized and prepared with 3 lug holes.  Nagivation was used to make a neutral proximal tibia resection, taking 6 mm of bone from  the less affected lateral side with 3 degrees of slope. The menisci were excised. A spacer block was placed, and the alignment and balance in extension were confirmed.   The distal femur was sized using the 3-degree external rotation guide referencing the posterior femoral cortex. The appropriate 4-in-1 cutting block was pinned into place. Rotation was checked using Whiteside's line, the epicondylar axis, and then confirmed with a spacer block in flexion. The remaining femoral cuts were performed, taking care to protect the MCL.  The patient's retained metallic foreign body was easy palpated on the lateral aspect of the distal femoral metaphysis, removed, and passed to the back table.  The tibia was sized and the trial tray was pinned into place. The remaining trail components were inserted. The knee was stable to varus and valgus stress through a full range of motion. The patella tracked centrally, and the PCL was well balanced. The trial components were removed, and the proximal tibial surface was prepared. Final components were impacted into place. The knee was tested for a final time and found to be well balanced.   The wound was copiously irrigated with Prontosan solution and normal saline using pulse lavage.  Marcaine  solution was injected into the periarticular soft tissue.  The wound was closed in layers using #1 Vicryl and Stratafix for the fascia, 2-0 Vicryl for the subcutaneous fat, 2-0 Monocryl for the deep dermal layer, 3-0 running Monocryl subcuticular Stitch, and 4-0 Monocryl stay sutures at both ends of the wound. Dermabond was applied to the skin.  Once the glue was fully dried, an Aquacell Ag and compressive dressing were applied.  The patient was transported to the recovery room in  stable condition.  Sponge, needle, and instrument counts were correct at the end of the case x2.  The patient tolerated the procedure well and there were no known complications.  Please note that a surgical  assistant was a medical necessity for this procedure in order to perform it in a safe and expeditious manner. Surgical assistant was necessary to retract the ligaments and vital neurovascular structures to prevent injury to them and also necessary for proper positioning of the limb to allow for anatomic placement of the prosthesis.

## 2024-06-01 NOTE — Interval H&P Note (Signed)
 History and Physical Interval Note:  06/01/2024 10:05 AM  Geoffrey Robinson  has presented today for surgery, with the diagnosis of Left knee osteoarthritis.  The various methods of treatment have been discussed with the patient and family. After consideration of risks, benefits and other options for treatment, the patient has consented to  Procedures: ARTHROPLASTY, KNEE, TOTAL, USING IMAGELESS COMPUTER-ASSISTED NAVIGATION (Left) as a surgical intervention.  The patient's history has been reviewed, patient examined, no change in status, stable for surgery.  I have reviewed the patient's chart and labs.  Questions were answered to the patient's satisfaction.     Redell PARAS Jasmene Goswami

## 2024-06-01 NOTE — Progress Notes (Signed)
°   06/01/24 2035  BiPAP/CPAP/SIPAP  $ Non-Invasive Home Ventilator  Initial  BiPAP/CPAP/SIPAP Pt Type Adult  BiPAP/CPAP/SIPAP Resmed  Mask Type Nasal pillows (from home)  Dentures removed? Not applicable  FiO2 (%) 21 %  Patient Home Machine No  Patient Home Mask Yes  Patient Home Tubing No  Auto Titrate Yes  Minimum cmH2O 5 cmH2O  Maximum cmH2O 20 cmH2O  Device Plugged into RED Power Outlet Yes  BiPAP/CPAP /SiPAP Vitals  Pulse Rate (!) 58  Resp 16  SpO2 99 %  Bilateral Breath Sounds Clear  MEWS Score/Color  MEWS Score 0  MEWS Score Color Landy

## 2024-06-01 NOTE — Discharge Instructions (Signed)

## 2024-06-01 NOTE — Anesthesia Procedure Notes (Signed)
 Spinal  Patient location during procedure: OR Start time: 06/01/2024 12:15 PM End time: 06/01/2024 12:20 PM Reason for block: surgical anesthesia  Staffing Performed: anesthesiologist  Authorized by: Keneth Lynwood POUR, MD   Performed by: Keneth Lynwood POUR, MD  Preanesthetic Checklist Completed: patient identified, IV checked, site marked, risks and benefits discussed, surgical consent, monitors and equipment checked, pre-op evaluation and timeout performed Spinal Block Patient position: sitting Prep: DuraPrep Patient monitoring: heart rate, cardiac monitor, continuous pulse ox and blood pressure Approach: midline Location: L3-4 Injection technique: single-shot Needle Needle type: Sprotte  Needle gauge: 24 G Needle length: 9 cm Assessment Sensory level: T4 Events: CSF return

## 2024-06-01 NOTE — Anesthesia Postprocedure Evaluation (Signed)
 Anesthesia Post Note  Patient: Geoffrey Robinson  Procedure(s) Performed: ARTHROPLASTY, KNEE, TOTAL, USING IMAGELESS COMPUTER-ASSISTED NAVIGATION (Left: Knee)     Patient location during evaluation: PACU Anesthesia Type: Spinal Level of consciousness: awake and alert Pain management: pain level controlled Vital Signs Assessment: post-procedure vital signs reviewed and stable Respiratory status: spontaneous breathing, nonlabored ventilation and respiratory function stable Cardiovascular status: blood pressure returned to baseline and stable Postop Assessment: no apparent nausea or vomiting Anesthetic complications: no   No notable events documented.  Last Vitals:  Vitals:   06/01/24 1630 06/01/24 1655  BP: 135/70 139/75  Pulse: (!) 50 (!) 51  Resp: 14 16  Temp:  36.6 C  SpO2: 96% 99%    Last Pain:  Vitals:   06/01/24 1655  TempSrc: Oral  PainSc:                  [name]

## 2024-06-01 NOTE — Transfer of Care (Signed)
 Immediate Anesthesia Transfer of Care Note  Patient: Geoffrey Robinson  Procedure(s) Performed: ARTHROPLASTY, KNEE, TOTAL, USING IMAGELESS COMPUTER-ASSISTED NAVIGATION (Left: Knee)  Patient Location: PACU  Anesthesia Type:MAC combined with regional for post-op pain  Level of Consciousness: awake and alert   Airway & Oxygen Therapy: Patient Spontanous Breathing and Patient connected to nasal cannula oxygen  Post-op Assessment: Report given to RN and Post -op Vital signs reviewed and stable  Post vital signs: Reviewed and stable  Last Vitals:  Vitals Value Taken Time  BP 129/74 06/01/24 15:15  Temp    Pulse 53 06/01/24 15:16  Resp 14 06/01/24 15:16  SpO2 93 % 06/01/24 15:16  Vitals shown include unfiled device data.  Last Pain:  Vitals:   06/01/24 0924  TempSrc: Oral  PainSc: 0-No pain         Complications: No notable events documented.

## 2024-06-02 ENCOUNTER — Other Ambulatory Visit (HOSPITAL_COMMUNITY): Payer: Self-pay

## 2024-06-02 ENCOUNTER — Encounter (HOSPITAL_COMMUNITY): Payer: Self-pay | Admitting: Orthopedic Surgery

## 2024-06-02 LAB — BASIC METABOLIC PANEL WITH GFR
Anion gap: 11 (ref 5–15)
BUN: 20 mg/dL (ref 6–20)
CO2: 23 mmol/L (ref 22–32)
Calcium: 8.7 mg/dL — ABNORMAL LOW (ref 8.9–10.3)
Chloride: 104 mmol/L (ref 98–111)
Creatinine, Ser: 0.7 mg/dL (ref 0.61–1.24)
GFR, Estimated: >60 mL/min (ref >60)
Glucose, Bld: 234 mg/dL — ABNORMAL HIGH (ref 70–99)
Potassium: 4.1 mmol/L (ref 3.5–5.1)
Sodium: 137 mmol/L (ref 135–145)

## 2024-06-02 LAB — CBC
HCT: 35.9 % — ABNORMAL LOW (ref 39.0–52.0)
Hemoglobin: 11.3 g/dL — ABNORMAL LOW (ref 13.0–17.0)
MCH: 26.7 pg (ref 26.0–34.0)
MCHC: 31.5 g/dL (ref 30.0–36.0)
MCV: 84.7 fL (ref 80.0–100.0)
Platelets: 201 K/uL (ref 150–400)
RBC: 4.24 MIL/uL (ref 4.22–5.81)
RDW: 13.9 % (ref 11.5–15.5)
WBC: 8.6 K/uL (ref 4.0–10.5)
nRBC: 0 % (ref 0.0–0.2)

## 2024-06-02 LAB — GLUCOSE, CAPILLARY
Glucose-Capillary: 186 mg/dL — ABNORMAL HIGH (ref 70–99)
Glucose-Capillary: 176 mg/dL — ABNORMAL HIGH (ref 70–99)

## 2024-06-02 MED ORDER — POLYETHYLENE GLYCOL 3350 17 GM/SCOOP PO POWD
17.0000 g | Freq: Every day | ORAL | 0 refills | Status: AC | PRN
  Filled 2024-06-02: qty 238, 14d supply, fill #0

## 2024-06-02 MED ORDER — METHOCARBAMOL 500 MG PO TABS
500.0000 mg | ORAL_TABLET | Freq: Four times a day (QID) | ORAL | 0 refills | Status: DC | PRN
  Filled 2024-06-02: qty 20, 5d supply, fill #0

## 2024-06-02 MED ORDER — DOCUSATE SODIUM 100 MG PO CAPS
100.0000 mg | ORAL_CAPSULE | Freq: Two times a day (BID) | ORAL | 0 refills | Status: AC
  Filled 2024-06-02: qty 60, 30d supply, fill #0

## 2024-06-02 MED ORDER — SENNA 8.6 MG PO TABS
2.0000 | ORAL_TABLET | Freq: Every day | ORAL | 0 refills | Status: AC
  Filled 2024-06-02: qty 30, 15d supply, fill #0

## 2024-06-02 MED ORDER — OXYCODONE HCL 5 MG PO TABS
5.0000 mg | ORAL_TABLET | ORAL | 0 refills | Status: DC | PRN
  Filled 2024-06-02: qty 40, 7d supply, fill #0

## 2024-06-02 MED ORDER — ONDANSETRON HCL 4 MG PO TABS
4.0000 mg | ORAL_TABLET | Freq: Three times a day (TID) | ORAL | 0 refills | Status: AC | PRN
  Filled 2024-06-02: qty 30, 10d supply, fill #0

## 2024-06-02 NOTE — Progress Notes (Signed)
° ° °  Subjective:  Patient reports pain as mild.  Denies N/V/CP/SOB/Abd pain. He denies any tingling or numbness in LE bilaterally.  HE reports some blood in urine this morning. He reports this has been an issue for him in the past that he sees urology for, patient is going to reach out to his urologist for this.  Catheter was removed this am, void pending. If issues with voiding today will consult urology.  Up with PT today.  Patient eager for d/c home.   Objective:   VITALS:   Vitals:   06/02/24 0135 06/02/24 0603 06/02/24 0925 06/02/24 0935  BP: 119/71 (!) 141/73 (!) 140/73   Pulse: (!) 55 (!) 58 (!) 56 62  Resp: 18 20 16    Temp: 98.2 F (36.8 C) 98 F (36.7 C) 98.2 F (36.8 C)   TempSrc: Oral Oral    SpO2: 97% 97% 98%   Weight:      Height:        NAD Neurologically intact ABD soft Neurovascular intact Sensation intact distally Intact pulses distally Dorsiflexion/Plantar flexion intact Incision: dressing C/D/I No cellulitis present Compartment soft   Lab Results  Component Value Date   WBC 8.6 06/02/2024   HGB 11.3 (L) 06/02/2024   HCT 35.9 (L) 06/02/2024   MCV 84.7 06/02/2024   PLT 201 06/02/2024   BMET    Component Value Date/Time   NA 137 06/02/2024 0317   NA 144 10/06/2023 0934   K 4.1 06/02/2024 0317   CL 104 06/02/2024 0317   CO2 23 06/02/2024 0317   GLUCOSE 234 (H) 06/02/2024 0317   BUN 20 06/02/2024 0317   BUN 19 10/06/2023 0934   CREATININE 0.70 06/02/2024 0317   CREATININE 0.99 03/11/2016 0853   CALCIUM 8.7 (L) 06/02/2024 0317   EGFR 98 10/06/2023 0934   GFRNONAA >60 06/02/2024 0317     Assessment/Plan: 1 Day Post-Op   Principal Problem:   S/P total knee arthroplasty, left   WBAT with walker DVT ppx: Xarelto , SCDs, TEDS PO pain control PT/OT: to come today  Dispo:  - Patient reports some blood in urine. Catheter was removed this am, void pending.He reports this has been an issue for him in the past that he sees urology for,  patient is going to reach out to his urologist for this. If issues with voiding today will consult urology.  - D/c home once cleared with PT and voided.    Geoffrey Robinson [name] 06/02/2024, 10:03 AM   [facility]  [place] [address], [address], [place], [place] [address] Phone: [phone] [email or web address] Facebook  [employer]

## 2024-06-02 NOTE — Discharge Summary (Signed)
 Physician Discharge Summary  Patient ID: Geoffrey Robinson MRN: [id number] DOB/AGE: April 20, 1961 63 y.o.  Admit date: 06/01/2024 Discharge date: 06/02/2024  Admission Diagnoses:  S/P total knee arthroplasty, left  Discharge Diagnoses:  Principal Problem:   S/P total knee arthroplasty, left   Past Medical History:  Diagnosis Date   A-fib [facility])    Arthritis    Ascending aorta dilatation    41mm by CT>>normal when indexed for BSA   Back pain    Bradycardia 10/21/2015   Diabetes mellitus without complication (HCC)    Dyslipidemia    Heart burn    High cholesterol    HTN (hypertension)    Hypothyroidism    Joint pain    NICM (nonischemic cardiomyopathy) (HCC)    a. LHC 2002: no CAD, EF 35-40%; b. Echo 2003: EF 60% - tachycardia mediated (AFlutter with RVR).  EF 50% on echo 11/2020   Obesity    Palpitation    Persistent atrial fibrillation (HCC)    CHADS2VASC score is 2.  s/p remote afib and aflutter ablations   Severe obstructive sleep apnea    , CPAP at 14cm H2O   Swelling of lower extremity    Varicose veins     Surgeries: Procedures: ARTHROPLASTY, KNEE, TOTAL, USING IMAGELESS COMPUTER-ASSISTED NAVIGATION on 06/01/2024   Consultants (if any):   Discharged Condition: Improved  Hospital Course: Geoffrey Robinson is an 63 y.o. male who was admitted 06/01/2024 with a diagnosis of S/P total knee arthroplasty, left and went to the operating room on 06/01/2024 and underwent the above named procedures.    He was given perioperative antibiotics:  Anti-infectives (From admission, onward)    Start     Dose/Rate Route Frequency Ordered Stop   06/01/24 1830  ceFAZolin  (ANCEF ) IVPB 2g/100 mL premix        2 g 200 mL/hr over 30 Minutes Intravenous Every 6 hours 06/01/24 1621 06/02/24 0030   06/01/24 0930  ceFAZolin  (ANCEF ) IVPB 2g/100 mL premix  Status:  Discontinued        2 g 200 mL/hr over 30 Minutes Intravenous On call to O.R. 06/01/24 9075 06/01/24 0927   06/01/24 0930   ceFAZolin  (ANCEF ) IVPB 3g/150 mL premix        3 g 300 mL/hr over 30 Minutes Intravenous On call to O.R. 06/01/24 0927 06/01/24 1259       He was given sequential compression devices, early ambulation, and xarelto  for DVT prophylaxis.  POD#1 Patient doing well. HE ambulated 125 feet with PT. Patient going to follow-up with urologist slight blood in urine, reports issue in the past. Follow-up in office in 2 week for repeat evaluation.   He benefited maximally from the hospital stay and there were no complications.    Recent vital signs:  Vitals:   06/02/24 0925 06/02/24 0935  BP: (!) 140/73   Pulse: (!) 56 62  Resp: 16   Temp: 98.2 F (36.8 C)   SpO2: 98%     Recent laboratory studies:  Lab Results  Component Value Date   HGB 11.3 (L) 06/02/2024   HGB 12.6 (L) 05/26/2024   HGB 11.6 (L) 02/18/2024   Lab Results  Component Value Date   WBC 8.6 06/02/2024   PLT 201 06/02/2024   Lab Results  Component Value Date   INR 2.2 07/06/2013   Lab Results  Component Value Date   NA 137 06/02/2024   K 4.1 06/02/2024   CL 104 06/02/2024   CO2 23 06/02/2024  BUN 20 06/02/2024   CREATININE 0.70 06/02/2024   GLUCOSE 234 (H) 06/02/2024     Allergies as of 06/02/2024       Reactions   Plavix [clopidogrel Bisulfate] Rash        Medication List     TAKE these medications    Acetaminophen  Extra Strength 500 MG Tabs Take 2 tablets (1,000 mg total) by mouth every 8 (eight) hours as needed for moderate pain (pain score 4-6).   chlorhexidine  4 % external liquid Commonly known as: HIBICLENS  Apply 15 mLs (1 Application total) topically as directed for 30 doses. Use as directed daily for 5 days every other week for 6 weeks.   digoxin  0.125 MG tablet Commonly known as: LANOXIN  TAKE 1 TABLET BY MOUTH DAILY   diltiazem  360 MG 24 hr capsule Commonly known as: CARDIZEM  CD TAKE 1 CAPSULE BY MOUTH EVERY DAY   docusate sodium  100 MG capsule Commonly known as: Colace Take 1  capsule (100 mg total) by mouth 2 (two) times daily.   fenofibrate  145 MG tablet Commonly known as: TRICOR  Take 1 tablet (145 mg total) by mouth daily.   finasteride  5 MG tablet Commonly known as: PROSCAR  Take 5 mg by mouth daily.   furosemide  20 MG tablet Commonly known as: LASIX  TAKE 1 TABLET AS NEEDED FOR EXCESS SWELLING/WEIGHT GAIN 2-3LBS PER DAY OR 5LBS PER WEEK   lisinopril  40 MG tablet Commonly known as: ZESTRIL  Take 1 tablet (40 mg total) by mouth daily.   methimazole  5 MG tablet Commonly known as: TAPAZOLE  Take 1 tablet (5 mg total) by mouth as directed. 1 tablet Monday through Friday and none Saturdays or sundays   methocarbamol  500 MG tablet Commonly known as: ROBAXIN  Take 1 tablet (500 mg total) by mouth every 6 (six) hours as needed for muscle spasms.   metoprolol  tartrate 100 MG tablet Commonly known as: LOPRESSOR  TAKE 1 TABLET BY MOUTH EVERY MORNING AND 1&1/2 TABLETS EVERY EVENING What changed: See the new instructions.   mupirocin  ointment 2 % Commonly known as: BACTROBAN  Place 1 Application into the nose 2 (two) times daily for 60 doses. Use as directed 2 times daily for 5 days every other week for 6 weeks.   omeprazole  40 MG capsule Commonly known as: PRILOSEC Take 1 capsule (40 mg total) by mouth daily.   ondansetron  4 MG tablet Commonly known as: Zofran  Take 1 tablet (4 mg total) by mouth every 8 (eight) hours as needed for nausea or vomiting.   oxyCODONE  5 MG immediate release tablet Commonly known as: Roxicodone  Take 1 tablet (5 mg total) by mouth every 4 (four) hours as needed for severe pain (pain score 7-10).   polyethylene glycol 17 g packet Commonly known as: MiraLax  Take 17 g by mouth daily as needed for mild constipation or moderate constipation.   senna 8.6 MG Tabs tablet Commonly known as: SENOKOT Take 2 tablets (17.2 mg total) by mouth at bedtime for 15 days.   tamsulosin  0.4 MG Caps capsule Commonly known as: FLOMAX  Take 0.4 mg  by mouth at bedtime.   tirzepatide  7.5 MG/0.5ML Pen Commonly known as: MOUNJARO  Inject 7.5 mg into the skin once a week.   Vitamin D  (Ergocalciferol ) 1.25 MG (50000 UNIT) Caps capsule Commonly known as: DRISDOL  Take 1 capsule (50,000 Units total) by mouth every 7 (seven) days.   Xarelto  20 MG Tabs tablet Generic drug: rivaroxaban  TAKE ONE TABLET BY MOUTH ONE TIME DAILY WITH SUPPER  Discharge Care Instructions  (From admission, onward)           Start     Ordered   06/02/24 0000  Weight bearing as tolerated        06/02/24 1013   06/02/24 0000  Change dressing       Comments: Do not remove your dressing.   06/02/24 1013              WEIGHT BEARING   Weight bearing as tolerated with assist device (walker, cane, etc) as directed, use it as long as suggested by your surgeon or therapist, typically at least 4-6 weeks.   EXERCISES  Results after joint replacement surgery are often greatly improved when you follow the exercise, range of motion and muscle strengthening exercises prescribed by your doctor. Safety measures are also important to protect the joint from further injury. Any time any of these exercises cause you to have increased pain or swelling, decrease what you are doing until you are comfortable again and then slowly increase them. If you have problems or questions, call your caregiver or physical therapist for advice.   Rehabilitation is important following a joint replacement. After just a few days of immobilization, the muscles of the leg can become weakened and shrink (atrophy).  These exercises are designed to build up the tone and strength of the thigh and leg muscles and to improve motion. Often times heat used for twenty to thirty minutes before working out will loosen up your tissues and help with improving the range of motion but do not use heat for the first two weeks following surgery (sometimes heat can increase post-operative  swelling).   These exercises can be done on a training (exercise) mat, on the floor, on a table or on a bed. Use whatever works the best and is most comfortable for you.    Use music or television while you are exercising so that the exercises are a pleasant break in your day. This will make your life better with the exercises acting as a break in your routine that you can look forward to.   Perform all exercises about fifteen times, three times per day or as directed.  You should exercise both the operative leg and the other leg as well.  Exercises include:   Quad Sets - Tighten up the muscle on the front of the thigh (Quad) and hold for 5-10 seconds.   Straight Leg Raises - With your knee straight (if you were given a brace, keep it on), lift the leg to 60 degrees, hold for 3 seconds, and slowly lower the leg.  Perform this exercise against resistance later as your leg gets stronger.  Leg Slides: Lying on your back, slowly slide your foot toward your buttocks, bending your knee up off the floor (only go as far as is comfortable). Then slowly slide your foot back down until your leg is flat on the floor again.  Angel Wings: Lying on your back spread your legs to the side as far apart as you can without causing discomfort.  Hamstring Strength:  Lying on your back, push your heel against the floor with your leg straight by tightening up the muscles of your buttocks.  Repeat, but this time bend your knee to a comfortable angle, and push your heel against the floor.  You may put a pillow under the heel to make it more comfortable if necessary.   A rehabilitation program following joint replacement surgery can speed  recovery and prevent re-injury in the future due to weakened muscles. Contact your doctor or a physical therapist for more information on knee rehabilitation.    CONSTIPATION  Constipation is defined medically as fewer than three stools per week and severe constipation as less than one stool  per week.  Even if you have a regular bowel pattern at home, your normal regimen is likely to be disrupted due to multiple reasons following surgery.  Combination of anesthesia, postoperative narcotics, change in appetite and fluid intake all can affect your bowels.   YOU MUST use at least one of the following options; they are listed in order of increasing strength to get the job done.  They are all available over the counter, and you may need to use some, POSSIBLY even all of these options:    Drink plenty of fluids (prune juice may be helpful) and high fiber foods Colace 100 mg by mouth twice a day  Senokot for constipation as directed and as needed Dulcolax (bisacodyl ), take with full glass of water   Miralax  (polyethylene glycol) once or twice a day as needed.  If you have tried all these things and are unable to have a bowel movement in the first 3-4 days after surgery call either your surgeon or your primary doctor.    If you experience loose stools or diarrhea, hold the medications until you stool forms back up.  If your symptoms do not get better within 1 week or if they get worse, check with your doctor.  If you experience the worst abdominal pain ever or develop nausea or vomiting, please contact the office immediately for further recommendations for treatment.   ITCHING:  If you experience itching with your medications, try taking only a single pain pill, or even half a pain pill at a time.  You can also use Benadryl  over the counter for itching or also to help with sleep.   TED HOSE STOCKINGS:  Use stockings on both legs until for at least 2 weeks or as directed by physician office. They may be removed at night for sleeping.  MEDICATIONS:  See your medication summary on the After Visit Summary that nursing will review with you.  You may have some home medications which will be placed on hold until you complete the course of blood thinner medication.  It is important for you to  complete the blood thinner medication as prescribed.  PRECAUTIONS:  If you experience chest pain or shortness of breath - call 911 immediately for transfer to the hospital emergency department.   If you develop a fever greater that 101 F, purulent drainage from wound, increased redness or drainage from wound, foul odor from the wound/dressing, or calf pain - CONTACT YOUR SURGEON.                                                   FOLLOW-UP APPOINTMENTS:  If you do not already have a post-op appointment, please call the office for an appointment to be seen by your surgeon.  Guidelines for how soon to be seen are listed in your After Visit Summary, but are typically between 1-4 weeks after surgery.  OTHER INSTRUCTIONS:   Knee Replacement:  Do not place pillow under knee, focus on keeping the knee straight while resting. CPM instructions: 0-90 degrees, 2 hours in the morning,  2 hours in the afternoon, and 2 hours in the evening. Place foam block, curve side up under heel at all times except when in CPM or when walking.  DO NOT modify, tear, cut, or change the foam block in any way.   MAKE SURE YOU:  Understand these instructions.  Get help right away if you are not doing well or get worse.    Thank you for letting us  be a part of your medical care team.  It is a privilege we respect greatly.  We hope these instructions will help you stay on track for a fast and full recovery!   Diagnostic Studies: DG Knee Left Port Result Date: 06/01/2024 CLINICAL DATA:  Left knee arthritis. EXAM: PORTABLE LEFT KNEE - 1-2 VIEW COMPARISON:  None Available. FINDINGS: There is a total left knee arthroplasty. The arthroplasty components appear intact and in anatomic alignment. No acute fracture or dislocation. The bones are osteopenic. Postsurgical changes and air in the joint space. Anterior knee cutaneous staples. IMPRESSION: Status post total left knee arthroplasty. No immediate complication. Electronically Signed    By: Vanetta Chou M.D.   On: 06/01/2024 16:04    Disposition: Discharge disposition: 01-Home or Self Care       Discharge Instructions     Call MD / Call 911   Complete by: As directed    If you experience chest pain or shortness of breath, CALL 911 and be transported to the hospital emergency room.  If you develope a fever above 101 F, pus (white drainage) or increased drainage or redness at the wound, or calf pain, call your surgeon's office.   Change dressing   Complete by: As directed    Do not remove your dressing.   Constipation Prevention   Complete by: As directed    Drink plenty of fluids.  Prune juice may be helpful.  You may use a stool softener, such as Colace (over the counter) 100 mg twice a day.  Use MiraLax  (over the counter) for constipation as needed.   Diet - low sodium heart healthy   Complete by: As directed    Discharge instructions   Complete by: As directed    Elevate toes above nose. Use cryotherapy as needed for pain and swelling.   Do not put a pillow under the knee. Place it under the heel.   Complete by: As directed    Driving restrictions   Complete by: As directed    No driving for 6 weeks   Increase activity slowly as tolerated   Complete by: As directed    Lifting restrictions   Complete by: As directed    No lifting for 6 weeks   Post-operative opioid taper instructions:   Complete by: As directed    POST-OPERATIVE OPIOID TAPER INSTRUCTIONS: It is important to wean off of your opioid medication as soon as possible. If you do not need pain medication after your surgery it is ok to stop day one. Opioids include: Codeine, Hydrocodone(Norco, Vicodin), Oxycodone (Percocet, oxycontin ) and hydromorphone  amongst others.  Long term and even short term use of opiods can cause: Increased pain response Dependence Constipation Depression Respiratory depression And more.  Withdrawal symptoms can include Flu like symptoms Nausea, vomiting And  more Techniques to manage these symptoms Hydrate well Eat regular healthy meals Stay active Use relaxation techniques(deep breathing, meditating, yoga) Do Not substitute Alcohol  to help with tapering If you have been on opioids for less than two weeks and do not have pain  than it is ok to stop all together.  Plan to wean off of opioids This plan should start within one week post op of your joint replacement. Maintain the same interval or time between taking each dose and first decrease the dose.  Cut the total daily intake of opioids by one tablet each day Next start to increase the time between doses. The last dose that should be eliminated is the evening dose.      TED hose   Complete by: As directed    Use own thigh high compression stockings.   Weight bearing as tolerated   Complete by: As directed         Follow-up Information     Leigh Valery RAMAN, PA-C. Schedule an appointment as soon as possible for a visit in 2 week(s).   Specialty: Orthopedic Surgery Why: For suture removal, For wound re-check Contact information: [address], [address] [place] [address] [phone]                  Signed: Valery RAMAN Leigh 06/02/2024, 10:13 AM

## 2024-06-02 NOTE — Progress Notes (Signed)
 Late entry for 1345 Discharge Medications delivered from TOC meds to bed [facility] outpatient pharmacy by this RN.

## 2024-06-02 NOTE — Evaluation (Signed)
 Physical Therapy Evaluation Patient Details Name: Geoffrey Robinson MRN: [id number] DOB: 22-Dec-1960 Today's Date: 06/02/2024  History of Present Illness  Pt s/p L TKR and with hx of R TKR, afib, DM, NICM, and obesity  Clinical Impression  Pt s/p L TKR and presents with decreased L LE strength/ROM and post op pain limiting functional mobility.  This date, pt up to ambulate in hall with good stability noted and HEP initiated with written instruction provided and reviewed.  Pt confirms no stairs at home and OP PT first appt scheduled for 06/05/24.  Pt eager for dc home this date.        If plan is discharge home, recommend the following: A little help with walking and/or transfers;A little help with bathing/dressing/bathroom;Assistance with cooking/housework;Assist for transportation;Help with stairs or ramp for entrance   Can travel by private vehicle        Equipment Recommendations None recommended by PT  Recommendations for Other Services       Functional Status Assessment Patient has had a recent decline in their functional status and demonstrates the ability to make significant improvements in function in a reasonable and predictable amount of time.     Precautions / Restrictions Precautions Precautions: Knee Restrictions Weight Bearing Restrictions Per Provider Order: No      Mobility  Bed Mobility Overal bed mobility: Needs Assistance Bed Mobility: Supine to Sit     Supine to sit: Supervision     General bed mobility comments: for safety only; no physical assist    Transfers Overall transfer level: Needs assistance Equipment used: Rolling walker (2 wheels) Transfers: Sit to/from Stand Sit to Stand: Contact guard assist, Supervision           General transfer comment: cues for LE management and use of UEs to self assist    Ambulation/Gait Ambulation/Gait assistance: Contact guard assist, Supervision Gait Distance (Feet): 125 Feet Assistive device:  Rolling walker (2 wheels) Gait Pattern/deviations: Step-to pattern, Step-through pattern, Shuffle       General Gait Details: cues for posture, position from RW and initial sequence  Stairs            Wheelchair Mobility     Tilt Bed    Modified Rankin (Stroke Patients Only)       Balance Overall balance assessment: Mild deficits observed, not formally tested                                           Pertinent Vitals/Pain Pain Assessment Pain Assessment: 0-10 Pain Score: 4  Pain Location: L knee Pain Descriptors / Indicators: Aching, Sore Pain Intervention(s): Monitored during session, Limited activity within patient's tolerance, Ice applied (pt declines premed)    Home Living Family/patient expects to be discharged to:: Private residence Living Arrangements: Spouse/significant other Available Help at Discharge: Family;Available 24 hours/day Type of Home: House Home Access: Ramped entrance       Home Layout: One level Home Equipment: Cane - single point      Prior Function Prior Level of Function : Independent/Modified Independent                     Extremity/Trunk Assessment   Upper Extremity Assessment Upper Extremity Assessment: Overall WFL for tasks assessed    Lower Extremity Assessment Lower Extremity Assessment: LLE deficits/detail LLE Deficits / Details: IND SLR with AAROM at knee -5 -  60    Cervical / Trunk Assessment Cervical / Trunk Assessment: Normal  Communication   Communication Communication: No apparent difficulties    Cognition Arousal: Alert Behavior During Therapy: WFL for tasks assessed/performed   PT - Cognitive impairments: No apparent impairments                         Following commands: Intact       Cueing Cueing Techniques: Verbal cues     General Comments      Exercises Total Joint Exercises Ankle Circles/Pumps: AROM, Both, 15 reps, Supine Quad Sets: AROM, Both, 10  reps, Supine Heel Slides: AAROM, Left, 15 reps, Supine Straight Leg Raises: AAROM, AROM, Left, 10 reps, Supine Long Arc Quad: AAROM, Left, 10 reps, Seated   Assessment/Plan    PT Assessment Patient needs continued PT services  PT Problem List Decreased strength;Decreased range of motion;Decreased activity tolerance;Decreased balance;Decreased mobility;Decreased knowledge of use of DME;Pain       PT Treatment Interventions DME instruction;Gait training;Stair training;Functional mobility training;Therapeutic activities;Therapeutic exercise;Patient/family education    PT Goals (Current goals can be found in the Care Plan section)  Acute Rehab PT Goals Patient Stated Goal: Regain IND PT Goal Formulation: With patient    Frequency 7X/week     Co-evaluation               AM-PAC PT 6 Clicks Mobility  Outcome Measure Help needed turning from your back to your side while in a flat bed without using bedrails?: A Little Help needed moving from lying on your back to sitting on the side of a flat bed without using bedrails?: A Little Help needed moving to and from a bed to a chair (including a wheelchair)?: A Little Help needed standing up from a chair using your arms (e.g., wheelchair or bedside chair)?: A Little Help needed to walk in hospital room?: A Little Help needed climbing 3-5 steps with a railing? : A Little 6 Click Score: 18    End of Session Equipment Utilized During Treatment: Gait belt Activity Tolerance: Patient tolerated treatment well Patient left: in chair;with call bell/phone within reach;with chair alarm set;with family/visitor present Nurse Communication: Mobility status PT Visit Diagnosis: Difficulty in walking, not elsewhere classified (R26.2)    Time: 9059-8987 PT Time Calculation (min) (ACUTE ONLY): 32 min   Charges:   PT Evaluation $PT Eval Low Complexity: 1 Low PT Treatments $Therapeutic Exercise: 8-22 mins PT General Charges $$ ACUTE PT  VISIT: 1 Visit         [facility] PT Acute Rehabilitation Services Office [phone]   Nayshawn Mesta 06/02/2024, 12:11 PM

## 2024-06-02 NOTE — TOC Transition Note (Signed)
 Transition of Care [facility]) - Discharge Note   Patient Details  Name: Geoffrey Robinson MRN: [id number] Date of Birth: [birth year]  [facility] CM/SW Contact:  Alfonse JONELLE Rex, RN Phone Number: 06/02/2024, 9:50 AM   Clinical Narrative:   Met with patient at bedside to review dc therapy and home equipment needs, patient confirmed OPPT- [facility] [place], has RW, no home DME needs. No INPT CM needs.     Final next level of care: OP Rehab Barriers to Discharge: No Barriers Identified   Patient Goals and CMS Choice Patient states their goals for this hospitalization and ongoing recovery are:: return home          Discharge Placement                       Discharge Plan and Services Additional resources added to the After Visit Summary for                                       Social Drivers of Health (SDOH) Interventions SDOH Screenings   Food Insecurity: No Food Insecurity (06/01/2024)  Housing: Low Risk (06/01/2024)  Transportation Needs: No Transportation Needs (06/01/2024)  Utilities: Not At Risk (06/01/2024)  Depression (PHQ2-9): Low Risk (12/29/2023)  Financial Resource Strain: Low Risk (12/26/2023)  Physical Activity: Insufficiently Active (12/26/2023)  Social Connections: Moderately Integrated (12/26/2023)  Stress: No Stress Concern Present (12/26/2023)  Tobacco Use: Low Risk (06/01/2024)     Readmission Risk Interventions     No data to display

## 2024-06-02 NOTE — Plan of Care (Signed)
   Problem: Education: Goal: Knowledge of General Education information will improve Description Including pain rating scale, medication(s)/side effects and non-pharmacologic comfort measures Outcome: Progressing   Problem: Health Behavior/Discharge Planning: Goal: Ability to manage health-related needs will improve Outcome: Progressing

## 2024-06-05 ENCOUNTER — Other Ambulatory Visit: Payer: Self-pay | Admitting: Internal Medicine

## 2024-06-18 ENCOUNTER — Other Ambulatory Visit: Payer: Self-pay | Admitting: Internal Medicine

## 2024-06-19 ENCOUNTER — Encounter: Payer: Self-pay | Admitting: Internal Medicine

## 2024-06-19 ENCOUNTER — Ambulatory Visit: Attending: Internal Medicine | Admitting: Internal Medicine

## 2024-06-19 ENCOUNTER — Telehealth: Payer: Self-pay | Admitting: Nurse Practitioner

## 2024-06-19 VITALS — BP 132/72 | HR 58 | Ht 72.5 in | Wt 275.4 lb

## 2024-06-19 DIAGNOSIS — I482 Chronic atrial fibrillation, unspecified: Secondary | ICD-10-CM

## 2024-06-19 DIAGNOSIS — E669 Obesity, unspecified: Secondary | ICD-10-CM

## 2024-06-19 NOTE — Telephone Encounter (Signed)
 Patient stated he is needing a refill of Mounjaro . Please advise

## 2024-06-19 NOTE — Patient Instructions (Signed)
 Medication Instructions:  Your physician recommends that you continue on your current medications as directed. Please refer to the Current Medication list given to you today.  *If you need a refill on your cardiac medications before your next appointment, please call your pharmacy*  Lab Work: None ordered.  You may go to any [facility] Location for your lab work:  [facility] - [address] ([facility]) - [address] [phone] [address]  [place] - [address]   [place]  - [address]   [place] - [address] - [address] [employer]  - [address] [place] [address] ([facility]   If you have labs (blood work) drawn today and your tests are completely normal, you will receive your results only by: [employer] (if you have MyChart)  If you have any lab test that is abnormal or we need to change your treatment, we will call you or send a MyChart message to review the results.  Testing/Procedures: None ordered.  Follow-Up: At [facility], you and your health needs are our priority.  As part of our continuing mission to provide you with exceptional heart care, we have created designated Provider Care Teams.  These Care Teams include your primary Cardiologist (physician) and Advanced Practice Providers (APPs -  Physician Assistants and Nurse Practitioners) who all work together to provide you with the care you need, when you need it.  Your next appointment:   1 year(s)  The format for your next appointment:   In Person  Provider:   Donnice Primus, MD or one of the following Advanced Practice Providers on your designated Care Team:   Geoffrey Robinson, [place] Geoffrey Robinson, [place] Geoffrey Barrack, NP  Note: Remote monitoring is used to monitor your Pacemaker/ ICD from home. This monitoring reduces the number of office visits required to check  your device to one time per year. It allows us  to keep an eye on the functioning of your device to ensure it is working properly.

## 2024-06-19 NOTE — Progress Notes (Signed)
 "     HPI Geoffrey Robinson returns today for followup of his atrial fib. He is a pleasant morbidly obese 63 yo man with HTN, mixed congestive heart failure who was seen by me almost 12 months ago. In the interim he has done well. He has undergone 2 knee replacements. And his weight is down another 35 lbs, almost 100 lbs.from his highest weight. He denies chest pain. His TSH is down. We had him wear a 3 day zio and his VR is well controlled. He thinks that he is better. His left knee is still sore but the right is not. Allergies[1]   Current Outpatient Medications  Medication Sig Dispense Refill   acetaminophen  (TYLENOL ) 500 MG tablet Take 2 tablets (1,000 mg total) by mouth every 8 (eight) hours as needed for moderate pain (pain score 4-6). 30 tablet 0   chlorhexidine  (HIBICLENS ) 4 % external liquid Apply 15 mLs (1 Application total) topically as directed for 30 doses. Use as directed daily for 5 days every other week for 6 weeks. 946 mL 1   digoxin  (LANOXIN ) 0.125 MG tablet TAKE 1 TABLET BY MOUTH DAILY 90 tablet 0   diltiazem  (CARDIZEM  CD) 360 MG 24 hr capsule TAKE 1 CAPSULE BY MOUTH EVERY DAY 90 capsule 3   docusate sodium  (COLACE) 100 MG capsule Take 1 capsule (100 mg total) by mouth 2 (two) times daily. 60 capsule 0   fenofibrate  (TRICOR ) 145 MG tablet TAKE 1 TABLET BY MOUTH EVERY DAY 90 tablet 0   finasteride  (PROSCAR ) 5 MG tablet Take 5 mg by mouth daily.     furosemide  (LASIX ) 20 MG tablet TAKE 1 TABLET AS NEEDED FOR EXCESS SWELLING/WEIGHT GAIN 2-3LBS PER DAY OR 5LBS PER WEEK 90 tablet 3   lisinopril  (ZESTRIL ) 40 MG tablet Take 1 tablet (40 mg total) by mouth daily. 90 tablet 1   methimazole  (TAPAZOLE ) 5 MG tablet Take 1 tablet (5 mg total) by mouth as directed. 1 tablet Monday through Friday and none Saturdays or sundays 65 tablet 3   methocarbamol  (ROBAXIN ) 500 MG tablet Take 1 tablet (500 mg total) by mouth every 6 (six) hours as needed for muscle spasms. 20 tablet 0   metoprolol  tartrate  (LOPRESSOR ) 100 MG tablet TAKE 1 TABLET BY MOUTH EVERY MORNING AND 1&1/2 TABLETS EVERY EVENING (Patient taking differently: Take 100-150 mg by mouth See admin instructions. Take 100 mg in the morning and 150 mg at bedtime) 225 tablet 3   mupirocin  ointment (BACTROBAN ) 2 % Place 1 Application into the nose 2 (two) times daily for 60 doses. Use as directed 2 times daily for 5 days every other week for 6 weeks. 60 g 0   omeprazole  (PRILOSEC) 40 MG capsule Take 1 capsule (40 mg total) by mouth daily. 90 capsule 1   ondansetron  (ZOFRAN ) 4 MG tablet Take 1 tablet (4 mg total) by mouth every 8 (eight) hours as needed for nausea or vomiting. 30 tablet 0   oxyCODONE  (ROXICODONE ) 5 MG immediate release tablet Take 1 tablet (5 mg total) by mouth every 4 (four) hours as needed for severe pain (pain score 7-10). 40 tablet 0   polyethylene glycol powder (SB POLYETHYLENE GLYCOL 3350 ) 17 GM/SCOOP powder Take 17 g by mouth daily as needed for mild constipation or moderate constipation. Dissolve 1 capful (17g) in 4-8 ounces of liquid and take by mouth daily. 238 g 0   tamsulosin  (FLOMAX ) 0.4 MG CAPS capsule Take 0.4 mg by mouth at bedtime.  tirzepatide  (MOUNJARO ) 7.5 MG/0.5ML Pen Inject 7.5 mg into the skin once a week. 2 mL 0   Vitamin D , Ergocalciferol , (DRISDOL ) 1.25 MG (50000 UNIT) CAPS capsule Take 1 capsule (50,000 Units total) by mouth every 7 (seven) days. 5 capsule 0   XARELTO  20 MG TABS tablet TAKE ONE TABLET BY MOUTH ONE TIME DAILY WITH SUPPER 90 tablet 1   No current facility-administered medications for this visit.     Past Medical History:  Diagnosis Date   A-fib [facility])    Arthritis    Ascending aorta dilatation    41mm by CT>>normal when indexed for BSA   Back pain    Bradycardia 10/21/2015   Diabetes mellitus without complication (HCC)    Dyslipidemia    Heart burn    High cholesterol    HTN (hypertension)    Hypothyroidism    Joint pain    NICM (nonischemic cardiomyopathy) (HCC)    a.  LHC 2002: no CAD, EF 35-40%; b. Echo 2003: EF 60% - tachycardia mediated (AFlutter with RVR).  EF 50% on echo 11/2020   Obesity    Palpitation    Persistent atrial fibrillation (HCC)    CHADS2VASC score is 2.  s/p remote afib and aflutter ablations   Severe obstructive sleep apnea    , CPAP at 14cm H2O   Swelling of lower extremity    Varicose veins     ROS:   All systems reviewed and negative except as noted in the HPI.   Past Surgical History:  Procedure Laterality Date   afib ablation      BIOPSY THYROID      10/2023   CARDIAC SURGERY     CARDIOVERSION N/A 09/07/2019   Procedure: CARDIOVERSION;  Surgeon: Loni Soyla LABOR, MD;  Location: [facility] ENDOSCOPY;  Service: Cardiovascular;  Laterality: N/A;   COLONOSCOPY     KNEE ARTHROPLASTY Right 02/17/2024   Procedure: ARTHROPLASTY, KNEE, TOTAL, USING IMAGELESS COMPUTER-ASSISTED NAVIGATION;  Surgeon: Fidel Rogue, MD;  Location: [facility] ORS;  Service: Orthopedics;  Laterality: Right;   KNEE ARTHROPLASTY Left 06/01/2024   Procedure: ARTHROPLASTY, KNEE, TOTAL, USING IMAGELESS COMPUTER-ASSISTED NAVIGATION;  Surgeon: Fidel Rogue, MD;  Location: [facility] ORS;  Service: Orthopedics;  Laterality: Left;   right knee surgery      VARICOSE VEIN SURGERY       Family History  Problem Relation Age of Onset   Hypertension Mother    Diabetes Mother    High blood pressure Mother    High Cholesterol Mother    Heart disease Mother    Sleep apnea Mother    Hypertension Father    Heart disease Father    High Cholesterol Father      Social History   Socioeconomic History   Marital status: Married    Spouse name: Not on file   Number of children: Not on file   Years of education: Not on file   Highest education level: 12th grade  Occupational History   Not on file  Tobacco Use   Smoking status: Never   Smokeless tobacco: Never  Vaping Use   Vaping status: Never Used  Substance and Sexual Activity   Alcohol  use: Yes    Alcohol /week: 3.0  standard drinks of alcohol     Types: 1 Standard drinks or equivalent, 1 Cans of beer, 1 Glasses of wine per week    Comment: occassional beer and wine   Drug use: No   Sexual activity: Not on file  Other Topics Concern  Not on file  Social History Narrative   Not on file   Social Drivers of Health   Tobacco Use: Low Risk (06/19/2024)   Patient History    Smoking Tobacco Use: Never    Smokeless Tobacco Use: Never    Passive Exposure: Not on file  Financial Resource Strain: Low Risk (12/26/2023)   Overall Financial Resource Strain (CARDIA)    Difficulty of Paying Living Expenses: Not very hard  Food Insecurity: No Food Insecurity (06/01/2024)   Epic    Worried About Programme Researcher, Broadcasting/film/video in the Last Year: Never true    Ran Out of Food in the Last Year: Never true  Transportation Needs: No Transportation Needs (06/01/2024)   Epic    Lack of Transportation (Medical): No    Lack of Transportation (Non-Medical): No  Physical Activity: Insufficiently Active (12/26/2023)   Exercise Vital Sign    Days of Exercise per Week: 3 days    Minutes of Exercise per Session: 20 min  Stress: No Stress Concern Present (12/26/2023)   [employer] of Occupational Health - Occupational Stress Questionnaire    Feeling of Stress: Not at all  Social Connections: Moderately Integrated (12/26/2023)   Social Connection and Isolation Panel    Frequency of Communication with Friends and Family: More than three times a week    Frequency of Social Gatherings with Friends and Family: More than three times a week    Attends Religious Services: More than 4 times per year    Active Member of [employer] or Organizations: No    Attends Banker Meetings: Not on file    Marital Status: Married  Intimate Partner Violence: Not At Risk (06/01/2024)   Epic    Fear of Current or Ex-Partner: No    Emotionally Abused: No    Physically Abused: No    Sexually Abused: No  Depression (PHQ2-9): Low Risk (12/29/2023)    Depression (PHQ2-9)    PHQ-2 Score: 3  Alcohol  Screen: Not on file  Housing: Low Risk (06/01/2024)   Epic    Unable to Pay for Housing in the Last Year: No    Number of Times Moved in the Last Year: 0    Homeless in the Last Year: No  Utilities: Not At Risk (06/01/2024)   Epic    Threatened with loss of utilities: No  Health Literacy: Not on file     BP 132/72   Pulse (!) 58   Ht 6' 0.5 (1.842 m)   Wt 275 lb 6.4 oz (124.9 kg)   SpO2 98%   BMI 36.84 kg/m   Physical Exam:  Well appearing NAD HEENT: Unremarkable Neck:  No JVD, no thyromegally Lymphatics:  No adenopathy Back:  No CVA tenderness Lungs:  Clear with no wheezes HEART:  Iregular rate rhythm, no murmurs, no rubs, no clicks Abd:  soft, positive bowel sounds, no organomegally, no rebound, no guarding Ext:  2 plus pulses, no edema, no cyanosis, no clubbing Skin:  No rashes no nodules Neuro:  CN II through XII intact, motor grossly intact  Assess/Plan:  Atrial fib with a controlled VR - I have recommended he continue digoxin  0.125 mg daily and metoprolol  100 bid and cardizem . His rates are controlled and digoxin  level was 0.5.  Obesity - I encouraged him to continue to work on this. Sleep apnea - he is tolerating his CPAP fairly well. Non-ischemic CM - he will continue his current meds. His EF is essentially normal.  Geoffrey Vernica Wachtel,MD    [1]  Allergies Allergen Reactions   Plavix [Clopidogrel Bisulfate] Rash   "

## 2024-06-20 MED ORDER — TIRZEPATIDE 7.5 MG/0.5ML ~~LOC~~ SOAJ: 7.5000 mg | SUBCUTANEOUS | 0 refills | Status: AC

## 2024-06-20 NOTE — Telephone Encounter (Signed)
 Refill sent in

## 2024-07-04 ENCOUNTER — Telehealth: Payer: Self-pay | Admitting: Student in an Organized Health Care Education/Training Program

## 2024-07-04 NOTE — Telephone Encounter (Signed)
 STAT if HR is under 50 or over 120  (normal HR is 60-100 beats per minute)  What is your heart rate?  127 currently  Do you have a log of your heart rate readings (document readings)?  Patient doesn't document readings. Patient says his HR has been ranging about 120-125 bpm at rest.  Do you have any other symptoms?  No. Patient denies palpitations/SOB/CP.

## 2024-07-04 NOTE — Telephone Encounter (Signed)
 Patient reports elevated heart rate this morning. He states he uses a smartwatch to monitor his heart rate, which is currently 128 bpm. Last blood pressure reading this morning was 128/97 prior to taking his medications. Patient reports noticing an increase in heart rate after starting Mounjaro  and states he skipped his dose this past Thursday to see if it would improve his heart rate. He denies shortness of breath, chest pain, or dizziness. States he doesn't feel like he is having palpitations. He reports no missed doses of his cardiac medications. Per pt at his last visit with Dr. Waddell, he was informed that his medications may need adjustment. Patient was advised that Dr. Almetta and the care team would be notified for further guidance. ED precautions were reviewed, and the patient verbalized understanding.

## 2024-07-05 NOTE — Telephone Encounter (Signed)
 Spoke with pt who reports HR continues to run in the 120's but he does not necessarily feel like he is in Afib.  Denies current CP or dizziness.  Pt does sound mildly SOB on the phone while talking.  Appointment scheduled with [facility]ib [facility] on Friday 07/07/2024 at 930am.  Reviewed ED precautions.  Pt verbalizes understanding and agrees with current plan.

## 2024-07-06 ENCOUNTER — Telehealth: Payer: Self-pay | Admitting: Student in an Organized Health Care Education/Training Program

## 2024-07-06 ENCOUNTER — Ambulatory Visit: Admitting: Nurse Practitioner

## 2024-07-06 NOTE — Telephone Encounter (Signed)
 Called Mr. 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  Fortunately for now he is not very symptomatic but does have a fixed HR at 120 so presumably a recurrent atypical AFL/RVR. He has had 3 prior ablation by his report, all of which were > 10 years ago. He has had LA AFL before and has had DCCV prior for this along with multiple AADs to manage this. He had one distant prior ablation with Dr. Waddell and then 2 with Dr. Epifanio at [facility]. I unfortunately don't have the op reports for these since they were so far back.  He was maintained on amiodarone  from 07/12/2019 to 04/11/2023 following cardioversion on 09/07/2019.  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  Additionally he was in AF with controlled VR on 04/22/2023 and so his amiodarone  was discontinued and he was started on digoxin  for rate control.  On 3-day monitor he remained in persistent AF on 06/22/2023 with an average VR 77 (48-115). LVEF has been as low as 35-40% (07/04/19) but had recovery with NSR.   We discussed either repeat cardioversion versus reattempt at ablation.  This unfortunately would be his fourth ablation.  Although he has had several ablations already he has not had any with PFA which may be of benefit for him.  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  Alternatively if we proceed with cardioversion he likely will still have AF but it will be easier to rate control than his current atypical AFL.  He is going to consider both options and is being evaluated in the AF clinic tomorrow.  I will call him again tomorrow evening to discuss further.  Since he is minimally symptomatic I do not think there is urgency for him to have restoration of sinus rhythm and if he decides to proceed with ablation then I would  leave him in this atypical AFL so that we can effectively map and ablate this.

## 2024-07-07 ENCOUNTER — Ambulatory Visit (HOSPITAL_COMMUNITY)
Admission: RE | Admit: 2024-07-07 | Discharge: 2024-07-07 | Disposition: A | Discharge status: Home / Self Care | Source: Ambulatory Visit | Attending: Physician Assistant | Admitting: Physician Assistant

## 2024-07-07 VITALS — BP 110/84 | HR 133 | Ht 72.5 in | Wt 278.0 lb

## 2024-07-07 DIAGNOSIS — I4891 Unspecified atrial fibrillation: Secondary | ICD-10-CM

## 2024-07-07 DIAGNOSIS — D6869 Other thrombophilia: Secondary | ICD-10-CM

## 2024-07-07 DIAGNOSIS — I484 Atypical atrial flutter: Secondary | ICD-10-CM

## 2024-07-07 DIAGNOSIS — I483 Typical atrial flutter: Secondary | ICD-10-CM | POA: Diagnosis not present

## 2024-07-07 DIAGNOSIS — I4819 Other persistent atrial fibrillation: Secondary | ICD-10-CM

## 2024-07-07 NOTE — H&P (View-Only) (Signed)
 "   Primary Care Physician: Theophilus Andrews, Tully GRADE, MD Primary Cardiologist: Dr Shlomo Primary Electrophysiologist: Dr Almetta  Referring Physician: Dr Shlomo Geoffrey Robinson Geoffrey Robinson is a 64 y.o. 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 Patient had aflutter ablation 2002 and afterwards developed atrial fibrillation and underwent ablation x 2 by Dr. Epifanio at [facility]. He was maintained on amiodarone  for rhythm control. He was seen by Dr Waddell in 2024 and amiodarone  was discontinued at that time due to thyroid  dysfunction, rate control pursued since that time.   Patient returns for follow up for atrial fibrillation. The patient called [facility] reporting persistently elevated HR in the 120s for the past 10 days. Dr Almetta spoke to patient about DCCV and repeat ablation over the phone. Fortunately, he is not very aware of his arrhythmia. There were no specific triggers that he could identify. No bleeding issues on anticoagulation.   Today, he  denies symptoms of palpitations, chest pain, shortness of breath, orthopnea, PND, lower extremity edema, dizziness, presyncope, syncope, bleeding, or neurologic sequela. The patient is tolerating medications without difficulties and is otherwise without complaint today.    Atrial Fibrillation Risk Factors:  he does have symptoms or diagnosis of sleep apnea. he does not have a history of rheumatic fever. he does not have a history of alcohol  use. The patient does have a history of early familial atrial fibrillation or other arrhythmias. Father and sister have afib.   Atrial Fibrillation Management history:  Previous antiarrhythmic drugs: sotalol, amiodarone  (hyperthyroid), Multaq  Previous cardioversions: remotely, 09/07/19 Previous ablations: 2002 flutter, fib x2 at  [facility] Anticoagulation history: Xarelto    Past Medical History:  Diagnosis Date   A-fib [facility])    Arthritis    Ascending aorta dilatation    41mm by CT>>normal when indexed for BSA   Back pain    Bradycardia 10/21/2015   Diabetes mellitus without complication (HCC)    Dyslipidemia    Heart burn    High cholesterol    HTN (hypertension)    Hypothyroidism    Joint pain    NICM (nonischemic cardiomyopathy) (HCC)    a. LHC 2002: no CAD, EF 35-40%; b. Echo 2003: EF 60% - tachycardia mediated (AFlutter with RVR).  EF 50% on echo 11/2020   Obesity    Palpitation    Persistent atrial fibrillation (HCC)    CHADS2VASC score is 2.  s/p remote afib and aflutter ablations   Severe obstructive sleep apnea    , CPAP at 14cm H2O   Swelling of lower extremity    Varicose veins      Current Outpatient Medications  Medication Sig Dispense Refill   acetaminophen  (TYLENOL ) 500 MG tablet Take 2 tablets (1,000 mg total) by mouth every 8 (eight) hours as needed for moderate pain (pain score 4-6). (Patient taking differently: Take 1,000 mg by mouth as needed for moderate pain (pain score 4-6).) 30 tablet 0   chlorhexidine  (HIBICLENS ) 4 % external liquid Apply 15 mLs (1 Application total) topically as directed for 30 doses. Use as directed daily for 5 days every other week for 6 weeks. 946 mL 1   digoxin  (LANOXIN ) 0.125 MG tablet TAKE 1 TABLET BY MOUTH DAILY 90 tablet 0   diltiazem  (CARDIZEM  CD) 360 MG 24 hr capsule TAKE 1 CAPSULE BY MOUTH EVERY DAY 90 capsule 3  fenofibrate  (TRICOR ) 145 MG tablet TAKE 1 TABLET BY MOUTH EVERY DAY 90 tablet 0   finasteride  (PROSCAR ) 5 MG tablet Take 5 mg by mouth daily.     furosemide  (LASIX ) 20 MG tablet TAKE 1 TABLET AS NEEDED FOR EXCESS SWELLING/WEIGHT GAIN 2-3LBS PER DAY OR 5LBS PER WEEK 90 tablet 3   lisinopril  (ZESTRIL ) 40 MG tablet Take 1 tablet (40 mg total) by mouth daily. 90 tablet 1   methimazole  (TAPAZOLE ) 5 MG tablet Take 1 tablet (5 mg total) by mouth as  directed. 1 tablet Monday through Friday and none Saturdays or sundays 65 tablet 3   metoprolol  tartrate (LOPRESSOR ) 100 MG tablet TAKE 1 TABLET BY MOUTH EVERY MORNING AND 1&1/2 TABLETS EVERY EVENING 225 tablet 3   Multiple Vitamins-Minerals (PRESERVISION AREDS 2) CAPS Take 1 capsule by mouth 2 (two) times daily.     omeprazole  (PRILOSEC) 40 MG capsule TAKE 1 CAPSULE (40 MG TOTAL) BY MOUTH DAILY. 90 capsule 1   tamsulosin  (FLOMAX ) 0.4 MG CAPS capsule Take 0.4 mg by mouth at bedtime.     tirzepatide  (MOUNJARO ) 7.5 MG/0.5ML Pen Inject 7.5 mg into the skin once a week. 2 mL 0   Vitamin D , Ergocalciferol , (DRISDOL ) 1.25 MG (50000 UNIT) CAPS capsule Take 1 capsule (50,000 Units total) by mouth every 7 (seven) days. 5 capsule 0   XARELTO  20 MG TABS tablet TAKE ONE TABLET BY MOUTH ONE TIME DAILY WITH SUPPER 90 tablet 1   No current facility-administered medications for this encounter.     ROS- All systems are reviewed and negative except as per the HPI above.  Physical Exam: Vitals:   07/07/24 1422  BP: 110/84  Pulse: (!) 133  Weight: 126.1 kg  Height: 6' 0.5 (1.842 m)     GEN: Well nourished, well developed in no acute distress CARDIAC: Irregularly irregular rate and rhythm, no murmurs, rubs, gallops RESPIRATORY:  Clear to auscultation without rales, wheezing or rhonchi  ABDOMEN: Soft, non-tender, non-distended EXTREMITIES:  No edema; No deformity    Wt Readings from Last 3 Encounters:  07/07/24 126.1 kg  06/19/24 124.9 kg  06/01/24 127 kg    EKG Interpretation Date/Time:  Friday July 07 2024 14:32:09 EST Ventricular Rate:  133 PR Interval:    QRS Duration:  148 QT Interval:  372 QTC Calculation: 553 R Axis:   121  Text Interpretation: Atrial flutter with 2:1 A-V conduction Right bundle branch block Abnormal ECG When compared with ECG of 11-Feb-2024 11:30, Atrial flutter has replaced Sinus rhythm Confirmed by Geoffrey Robinson ([id number]) on 07/07/2024 2:44:54 PM    Echo 06/30/23   1. Left ventricular ejection fraction, by estimation, is 50 to 55%. The  left ventricle has low normal function. The left ventricle has no regional  wall motion abnormalities. There is mild left ventricular hypertrophy.  Left ventricular diastolic  parameters are indeterminate.   2. Right ventricular systolic function is normal. The right ventricular  size is normal. Tricuspid regurgitation signal is inadequate for assessing  PA pressure.   3. The mitral valve is normal in structure. Trivial mitral valve  regurgitation. No evidence of mitral stenosis.   4. The aortic valve is tricuspid. Aortic valve regurgitation is trivial.  No aortic stenosis is present.   5. Aortic dilatation noted. 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    Epic records are reviewed at length today   CHA2DS2-VASc Score = 2  The patient's score is based upon: CHF History: 0 HTN History: 1 Diabetes History: 1 Stroke History: 0 Vascular Disease History: 0 Age Score: 0 Gender Score: 0       ASSESSMENT AND PLAN: Persistent Atrial Fibrillation/atrial flutter (ICD10:  I48.19) The patient's CHA2DS2-VASc score is 2, indicating a 2.2% annual risk of stroke.   S/p flutter ablation 2002 with Dr Waddell. Afib ablation x 2 with Dr Epifanio >10 years ago. Previously failed amiodarone  due to hyperthyroidism.  He is in atrial flutter today with elevated rates. We discussed rhythm control options and he would like to pursue PFA. Continue digoxin  0.125 mg daily Continue diltiazem  360 mg daily Continue Lopressor  100 mg AM and 150 mg PM Continue Xarelto  20 mg daily Check bmet/cbc today  Discussed treatment options today for AF including antiarrhythmic drug therapy and ablation. Risk, benefits, and alternatives to EP study and ablation for afib were discussed. These risks include but are not limited to stroke, bleeding, vascular damage,  tamponade, perforation, damage to the esophagus, lungs, phrenic nerve and other structures, pulmonary vein stenosis, worsening renal function, coronary vasospasm and death. The patient understands these risk and wishes to proceed today.    Secondary Hypercoagulable State (ICD10:  D68.69) The patient is at significant risk for stroke/thromboembolism based upon his CHA2DS2-VASc Score of 2.  Continue Rivaroxaban  (Xarelto ). No bleeding issues.   Obesity Body mass index is 37.19 kg/m.  Encouraged lifestyle modification On Mounjaro   OSA  Encouraged nightly CPAP  HTN Stable on current regimen  HFrecEF EF normalized with SR/rate control EF on echo 06/30/23 50-55% GDMT per primary cardiology team Fluid status appears stable today    Follow up with Dr Almetta for ablation. AF clinic 1 month post ablation.    Daril Kicks PA-C Afib Clinic [facility] [address] [place], [place] [address] [phone] 07/07/2024 3:04 PM  "

## 2024-07-07 NOTE — Progress Notes (Signed)
 "   Primary Care Physician: Theophilus Andrews, Tully GRADE, MD Primary Cardiologist: Dr Shlomo Primary Electrophysiologist: Dr Almetta  Referring Physician: Dr Shlomo Geoffrey Robinson is a 64 y.o. 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 Patient had aflutter ablation 2002 and afterwards developed atrial fibrillation and underwent ablation x 2 by Dr. Epifanio at [facility]. He was maintained on amiodarone  for rhythm control. He was seen by Dr Waddell in 2024 and amiodarone  was discontinued at that time due to thyroid  dysfunction, rate control pursued since that time.   Patient returns for follow up for atrial fibrillation. The [facility] called [facility] reporting persistently elevated HR in the 120s for the past 10 days. Dr Almetta spoke to patient about DCCV and repeat ablation over the phone. Fortunately, he is not very aware of his arrhythmia. There were no specific triggers that he could identify. No bleeding issues on anticoagulation.   Today, he  denies symptoms of palpitations, chest pain, shortness of breath, orthopnea, PND, lower extremity edema, dizziness, presyncope, syncope, bleeding, or neurologic sequela. The patient is tolerating medications without difficulties and is otherwise without complaint today.    Atrial Fibrillation Risk Factors:  he does have symptoms or diagnosis of sleep apnea. he does not have a history of rheumatic fever. he does not have a history of alcohol  use. The patient does have a history of early familial atrial fibrillation or other arrhythmias. Father and sister have afib.   Atrial Fibrillation Management history:  Previous antiarrhythmic drugs: sotalol, amiodarone  (hyperthyroid), Multaq  Previous cardioversions: remotely, 09/07/19 Previous ablations: 2002 flutter, fib x2 at  [facility] Anticoagulation history: Xarelto    Past Medical History:  Diagnosis Date   A-fib [facility])    Arthritis    Ascending aorta dilatation    41mm by CT>>normal when indexed for BSA   Back pain    Bradycardia 10/21/2015   Diabetes mellitus without complication (HCC)    Dyslipidemia    Heart burn    High cholesterol    HTN (hypertension)    Hypothyroidism    Joint pain    NICM (nonischemic cardiomyopathy) (HCC)    a. LHC 2002: no CAD, EF 35-40%; b. Echo 2003: EF 60% - tachycardia mediated (AFlutter with RVR).  EF 50% on echo 11/2020   Obesity    Palpitation    Persistent atrial fibrillation (HCC)    CHADS2VASC score is 2.  s/p remote afib and aflutter ablations   Severe obstructive sleep apnea    , CPAP at 14cm H2O   Swelling of lower extremity    Varicose veins      Current Outpatient Medications  Medication Sig Dispense Refill   acetaminophen  (TYLENOL ) 500 MG tablet Take 2 tablets (1,000 mg total) by mouth every 8 (eight) hours as needed for moderate pain (pain score 4-6). (Patient taking differently: Take 1,000 mg by mouth as needed for moderate pain (pain score 4-6).) 30 tablet 0   chlorhexidine  (HIBICLENS ) 4 % external liquid Apply 15 mLs (1 Application total) topically as directed for 30 doses. Use as directed daily for 5 days every other week for 6 weeks. 946 mL 1   digoxin  (LANOXIN ) 0.125 MG tablet TAKE 1 TABLET BY MOUTH DAILY 90 tablet 0   diltiazem  (CARDIZEM  CD) 360 MG 24 hr capsule TAKE 1 CAPSULE BY MOUTH EVERY DAY 90 capsule 3  fenofibrate  (TRICOR ) 145 MG tablet TAKE 1 TABLET BY MOUTH EVERY DAY 90 tablet 0   finasteride  (PROSCAR ) 5 MG tablet Take 5 mg by mouth daily.     furosemide  (LASIX ) 20 MG tablet TAKE 1 TABLET AS NEEDED FOR EXCESS SWELLING/WEIGHT GAIN 2-3LBS PER DAY OR 5LBS PER WEEK 90 tablet 3   lisinopril  (ZESTRIL ) 40 MG tablet Take 1 tablet (40 mg total) by mouth daily. 90 tablet 1   methimazole  (TAPAZOLE ) 5 MG tablet Take 1 tablet (5 mg total) by mouth as  directed. 1 tablet Monday through Friday and none Saturdays or sundays 65 tablet 3   metoprolol  tartrate (LOPRESSOR ) 100 MG tablet TAKE 1 TABLET BY MOUTH EVERY MORNING AND 1&1/2 TABLETS EVERY EVENING 225 tablet 3   Multiple Vitamins-Minerals (PRESERVISION AREDS 2) CAPS Take 1 capsule by mouth 2 (two) times daily.     omeprazole  (PRILOSEC) 40 MG capsule TAKE 1 CAPSULE (40 MG TOTAL) BY MOUTH DAILY. 90 capsule 1   tamsulosin  (FLOMAX ) 0.4 MG CAPS capsule Take 0.4 mg by mouth at bedtime.     tirzepatide  (MOUNJARO ) 7.5 MG/0.5ML Pen Inject 7.5 mg into the skin once a week. 2 mL 0   Vitamin D , Ergocalciferol , (DRISDOL ) 1.25 MG (50000 UNIT) CAPS capsule Take 1 capsule (50,000 Units total) by mouth every 7 (seven) days. 5 capsule 0   XARELTO  20 MG TABS tablet TAKE ONE TABLET BY MOUTH ONE TIME DAILY WITH SUPPER 90 tablet 1   No current facility-administered medications for this encounter.     ROS- All systems are reviewed and negative except as per the HPI above.  Physical Exam: Vitals:   07/07/24 1422  BP: 110/84  Pulse: (!) 133  Weight: 126.1 kg  Height: 6' 0.5 (1.842 m)     GEN: Well nourished, well developed in no acute distress CARDIAC: Irregularly irregular rate and rhythm, no murmurs, rubs, gallops RESPIRATORY:  Clear to auscultation without rales, wheezing or rhonchi  ABDOMEN: Soft, non-tender, non-distended EXTREMITIES:  No edema; No deformity    Wt Readings from Last 3 Encounters:  07/07/24 126.1 kg  06/19/24 124.9 kg  06/01/24 127 kg    EKG Interpretation Date/Time:  Friday July 07 2024 14:32:09 EST Ventricular Rate:  133 PR Interval:    QRS Duration:  148 QT Interval:  372 QTC Calculation: 553 R Axis:   121  Text Interpretation: Atrial flutter with 2:1 A-V conduction Right bundle branch block Abnormal ECG When compared with ECG of 11-Feb-2024 11:30, Atrial flutter has replaced Sinus rhythm Confirmed by Javyon Fontan ([id number]) on 07/07/2024 2:44:54 PM    Echo 06/30/23   1. Left ventricular ejection fraction, by estimation, is 50 to 55%. The  left ventricle has low normal function. The left ventricle has no regional  wall motion abnormalities. There is mild left ventricular hypertrophy.  Left ventricular diastolic  parameters are indeterminate.   2. Right ventricular systolic function is normal. The right ventricular  size is normal. Tricuspid regurgitation signal is inadequate for assessing  PA pressure.   3. The mitral valve is normal in structure. Trivial mitral valve  regurgitation. No evidence of mitral stenosis.   4. The aortic valve is tricuspid. Aortic valve regurgitation is trivial.  No aortic stenosis is present.   5. Aortic dilatation noted. 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    Epic records are reviewed at length today   CHA2DS2-VASc Score = 2  The patient's score is based upon: CHF History: 0 HTN History: 1 Diabetes History: 1 Stroke History: 0 Vascular Disease History: 0 Age Score: 0 Gender Score: 0       ASSESSMENT AND PLAN: Persistent Atrial Fibrillation/atrial flutter (ICD10:  I48.19) The patient's CHA2DS2-VASc score is 2, indicating a 2.2% annual risk of stroke.   S/p flutter ablation 2002 with Dr Waddell. Afib ablation x 2 with Dr Epifanio >10 years ago. Previously failed amiodarone  due to hyperthyroidism.  He is in atrial flutter today with elevated rates. We discussed rhythm control options and he would like to pursue PFA. Continue digoxin  0.125 mg daily Continue diltiazem  360 mg daily Continue Lopressor  100 mg AM and 150 mg PM Continue Xarelto  20 mg daily Check bmet/cbc today  Discussed treatment options today for AF including antiarrhythmic drug therapy and ablation. Risk, benefits, and alternatives to EP study and ablation for afib were discussed. These risks include but are not limited to stroke, bleeding, vascular damage,  tamponade, perforation, damage to the esophagus, lungs, phrenic nerve and other structures, pulmonary vein stenosis, worsening renal function, coronary vasospasm and death. The patient understands these risk and wishes to proceed today.    Secondary Hypercoagulable State (ICD10:  D68.69) The patient is at significant risk for stroke/thromboembolism based upon his CHA2DS2-VASc Score of 2.  Continue Rivaroxaban  (Xarelto ). No bleeding issues.   Obesity Body mass index is 37.19 kg/m.  Encouraged lifestyle modification On Mounjaro   OSA  Encouraged nightly CPAP  HTN Stable on current regimen  HFrecEF EF normalized with SR/rate control EF on echo 06/30/23 50-55% GDMT per primary cardiology team Fluid status appears stable today    Follow up with Dr Almetta for ablation. AF clinic 1 month post ablation.    Daril Kicks PA-C Afib Clinic [facility] [address] [place], [place] [address] [phone] 07/07/2024 3:04 PM  "

## 2024-07-08 LAB — BASIC METABOLIC PANEL WITH GFR
BUN/Creatinine Ratio: 21 (ref 10–24)
BUN: 16 mg/dL (ref 8–27)
CO2: 21 mmol/L (ref 20–29)
Calcium: 9.4 mg/dL (ref 8.6–10.2)
Chloride: 105 mmol/L (ref 96–106)
Creatinine, Ser: 0.78 mg/dL (ref 0.76–1.27)
Glucose: 167 mg/dL — ABNORMAL HIGH (ref 70–99)
Potassium: 4.6 mmol/L (ref 3.5–5.2)
Sodium: 142 mmol/L (ref 134–144)
eGFR: 101 mL/min/1.73

## 2024-07-08 LAB — CBC
Hematocrit: 38.4 % (ref 37.5–51.0)
Hemoglobin: 12.4 g/dL — ABNORMAL LOW (ref 13.0–17.7)
MCH: 26.8 pg (ref 26.6–33.0)
MCHC: 32.3 g/dL (ref 31.5–35.7)
MCV: 83 fL (ref 79–97)
Platelets: 286 x10E3/uL (ref 150–450)
RBC: 4.62 x10E6/uL (ref 4.14–5.80)
RDW: 13.8 % (ref 11.6–15.4)
WBC: 7.7 x10E3/uL (ref 3.4–10.8)

## 2024-07-10 ENCOUNTER — Other Ambulatory Visit: Payer: Self-pay

## 2024-07-10 ENCOUNTER — Ambulatory Visit (HOSPITAL_COMMUNITY): Payer: Self-pay | Admitting: Physician Assistant

## 2024-07-10 ENCOUNTER — Telehealth: Payer: Self-pay

## 2024-07-10 DIAGNOSIS — I4819 Other persistent atrial fibrillation: Secondary | ICD-10-CM

## 2024-07-10 NOTE — Telephone Encounter (Signed)
-----   Message from Nurse Aldona SAUNDERS, RN sent at 07/10/2024  9:35 AM EST ----- Regarding: 02/02 Afib ablation Almetta Important: list procedure date as first item in subject line, followed by procedure type (e.g., 03/03/24 AFib ablation)  Precert:  MD: Almetta Type of ablation: A-fib Diagnosis: Afib CPT code: A-fib (06343) Ablation scheduled (date/time): 02/02 730am  Procedure:  Added to calendar? Yes Orders entered? Yes Letter complete? Yes Scheduled with cath lab? Yes Any medications to hold? Routine Labs ordered (CBC, BMET, PT/INR if on warfarin): Complete Mapping system: CARTO (lab 4 or 6) CARTO/OPAL rep notified? Yes Cardiac CT needed? No Dye allergy? No Pre-meds ordered and instructions given? N/A Letter method: MyChart H&P: Day of per Almetta Device: No  Follow-up:  Cassie/Angel, please schedule Routine.  Covering RN - please send this message to [employer], EP scheduler, EP Scheduling pool, EP [employer], and CT scheduler (Brittany Lynch/Stephanie Mogg), if indicated.

## 2024-07-12 ENCOUNTER — Other Ambulatory Visit: Payer: Self-pay | Admitting: Cardiology

## 2024-07-12 ENCOUNTER — Ambulatory Visit: Admitting: Nurse Practitioner

## 2024-07-12 ENCOUNTER — Other Ambulatory Visit: Payer: Self-pay | Admitting: Internal Medicine

## 2024-07-12 ENCOUNTER — Encounter (HOSPITAL_COMMUNITY): Payer: Self-pay

## 2024-07-12 ENCOUNTER — Encounter: Payer: Self-pay | Admitting: Nurse Practitioner

## 2024-07-12 VITALS — BP 124/76 | HR 70 | Temp 98.0°F | Ht 72.0 in | Wt 273.0 lb

## 2024-07-12 DIAGNOSIS — E0590 Thyrotoxicosis, unspecified without thyrotoxic crisis or storm: Secondary | ICD-10-CM

## 2024-07-12 DIAGNOSIS — E785 Hyperlipidemia, unspecified: Secondary | ICD-10-CM | POA: Diagnosis not present

## 2024-07-12 DIAGNOSIS — E559 Vitamin D deficiency, unspecified: Secondary | ICD-10-CM

## 2024-07-12 DIAGNOSIS — E66812 Obesity, class 2: Secondary | ICD-10-CM

## 2024-07-12 DIAGNOSIS — Z6837 Body mass index (BMI) 37.0-37.9, adult: Secondary | ICD-10-CM | POA: Diagnosis not present

## 2024-07-12 DIAGNOSIS — E669 Obesity, unspecified: Secondary | ICD-10-CM

## 2024-07-12 DIAGNOSIS — E1169 Type 2 diabetes mellitus with other specified complication: Secondary | ICD-10-CM

## 2024-07-12 DIAGNOSIS — Z79899 Other long term (current) drug therapy: Secondary | ICD-10-CM

## 2024-07-12 NOTE — Progress Notes (Signed)
 "  Office: [phone]  /  Fax: [phone]  WEIGHT SUMMARY AND BIOMETRICS  Weight Lost Since Last Visit: 4lb  Weight Gained Since Last Visit: 0lb   Vitals Temp: 98 F (36.7 C) BP: 124/76 Pulse Rate: 70 SpO2: 100 %   Anthropometric Measurements Height: 6' (1.829 m) Weight: 273 lb (123.8 kg) BMI (Calculated): 37.02 Weight at Last Visit: 277lb Weight Lost Since Last Visit: 4lb Weight Gained Since Last Visit: 0lb Starting Weight: 339lb Total Weight Loss (lbs): 66 lb (29.9 kg)   Body Composition  Body Fat %: 40.9 % Fat Mass (lbs): 112 lbs Muscle Mass (lbs): 153.6 lbs Visceral Fat Rating : 24   Other Clinical Data Fasting: Yes Labs: No Today's Visit #: 29 Starting Date: 01/14/22     HPI  Chief Complaint: OBESITY  Rizwan is here to discuss his progress with his obesity treatment plan. He is on the the Category 4 Plan and states he is following his eating plan approximately 50 % of the time. He states he is exercising 15 minutes 7 days per week.   Interval History:  Since last office visit he has lost 4 pounds.  He had knee surgery 06/01/24.  He finished PT this week.  He is walking to stay active.    His highest weight was 392 lbs.    Pharmacotherapy for weight loss: He is not currently taking medications for medical weight loss.     Previous pharmacotherapy for medical weight loss:  None   Bariatric surgery:  Patient has not had bariatric surgery.    Pharmacotherapy for DMT2:   He took Mounjaro  last 3 weeks ago.  He stopped prior to surgery and restarted taking it 3 weeks after suragery.  He noted some weakness after taking the Mounjaro  and then stopped taking it and hasn't restarted taking it.  He is scheduled for an ablation on 07/24/24.  He was told by cardiology not to restart his Mounjaro  until after his ablation.  Last A1c was 7.3 on 05/26/24 CBGs: 153-163 Episodes of hypoglycemia: Denies On Lisinopril  40mg  and Xarelto  20mg .  Last eye exam:  Nov  2025-saw a specialist due to visual changes-told a spot in his retina-aware that he is taking Mounjaro  and doesn't feel due to taking Mounjaro .   He doesn't want to take Metformin.    Lab Results  Component Value Date   HGBA1C 7.3 (H) 05/26/2024   HGBA1C 6.7 (H) 02/11/2024   HGBA1C 6.7 (H) 10/06/2023   Lab Results  Component Value Date   LDLCALC 101 (H) 10/06/2023   CREATININE 0.78 07/07/2024    Hyperlipidemia Medication(s): Tricor  145mg . Denies side effects.     Lab Results  Component Value Date   CHOL 161 10/06/2023   HDL 29 (L) 10/06/2023   LDLCALC 101 (H) 10/06/2023   TRIG 174 (H) 10/06/2023   CHOLHDL 4.6 04/11/2009   Lab Results  Component Value Date   ALT 26 10/06/2023   AST 21 10/06/2023   ALKPHOS 87 10/06/2023   BILITOT 0.4 10/06/2023   The 10-year ASCVD risk score (Arnett DK, et al., 2019) is: 22.5%   Values used to calculate the score:     Age: 52 years     Clinically relevant sex: Male     Is Non-Hispanic African American: No     Diabetic: Yes     Tobacco smoker: No     Systolic Blood Pressure: 124 mmHg     Is BP treated: Yes     HDL Cholesterol:  29 mg/dL     Total Cholesterol: 161 mg/dL   Hyperthyroidism Saw endo last on 04/21/24 and has a follow up appt on 10/20/24   Last TSH was 0.70, free T4 was 1.3 and free T3 was 3.3 on 04/21/24   -Thyroid  ultrasound revealed multinodular goiter in February, 2025   -Biopsy May 2nd-benign thyroid  FNA results.    -Cardiology stopped his amio and started him on digoxin  0.125mg  (started Dec 1st).   -Has discussed labs with cardiology   -denies FH of thyroid  cancer   -He was started on Methimazole  5mg  Mon-Friday.   He saw cardiology last on 06/19/24, a fib clinic on 07/07/24 and is scheduled  for an ablation on 07/24/24  Vit D deficiency  He is not taking Vit D. Stopped prior to surgery.    Lab Results  Component Value Date   VD25OH 36.6 10/06/2023   VD25OH 40.3 06/02/2023   VD25OH 43.1 02/24/2023      PHYSICAL EXAM:  Blood pressure 124/76, pulse 70, temperature 98 F (36.7 C), height 6' (1.829 m), weight 273 lb (123.8 kg), SpO2 100%. Body mass index is 37.03 kg/m.  General: He is overweight, cooperative, alert, well developed, and in no acute distress. PSYCH: Has normal mood, affect and thought process.   Extremities: No edema.  Neurologic: No gross sensory or motor deficits. No tremors or fasciculations noted.    DIAGNOSTIC DATA REVIEWED:  BMET    Component Value Date/Time   NA 142 07/07/2024 1513   K 4.6 07/07/2024 1513   CL 105 07/07/2024 1513   CO2 21 07/07/2024 1513   GLUCOSE 167 (H) 07/07/2024 1513   GLUCOSE 234 (H) 06/02/2024 0317   BUN 16 07/07/2024 1513   CREATININE 0.78 07/07/2024 1513   CREATININE 0.99 03/11/2016 0853   CALCIUM 9.4 07/07/2024 1513   GFRNONAA >60 06/02/2024 0317   GFRAA >60 08/15/2019 0820   Lab Results  Component Value Date   HGBA1C 7.3 (H) 05/26/2024   HGBA1C  04/10/2009    5.9 (NOTE) The ADA recommends the following therapeutic goal for glycemic control related to Hgb A1c measurement: Goal of therapy: <6.5 Hgb A1c  Reference: American Diabetes Association: Clinical Practice Recommendations 2010, Diabetes Care, 2010, 33: (Suppl  1).   Lab Results  Component Value Date   INSULIN  30.0 (H) 06/30/2022   INSULIN  27.1 (H) 01/14/2022   Lab Results  Component Value Date   TSH 0.70 04/21/2024   CBC    Component Value Date/Time   WBC 7.7 07/07/2024 1513   WBC 8.6 06/02/2024 0317   RBC 4.62 07/07/2024 1513   RBC 4.24 06/02/2024 0317   HGB 12.4 (L) 07/07/2024 1513   HCT 38.4 07/07/2024 1513   PLT 286 07/07/2024 1513   MCV 83 07/07/2024 1513   MCH 26.8 07/07/2024 1513   MCH 26.7 06/02/2024 0317   MCHC 32.3 07/07/2024 1513   MCHC 31.5 06/02/2024 0317   RDW 13.8 07/07/2024 1513   Iron Studies No results found for: IRON, TIBC, FERRITIN, IRONPCTSAT Lipid Panel     Component Value Date/Time   CHOL 161 10/06/2023 0934    TRIG 174 (H) 10/06/2023 0934   HDL 29 (L) 10/06/2023 0934   CHOLHDL 4.6 04/11/2009 0451   VLDL 20 04/11/2009 0451   LDLCALC 101 (H) 10/06/2023 0934   Hepatic Function Panel     Component Value Date/Time   PROT 7.1 10/06/2023 0934   ALBUMIN 4.3 10/06/2023 0934   AST 21 10/06/2023 0934   ALT  26 10/06/2023 0934   ALKPHOS 87 10/06/2023 0934   BILITOT 0.4 10/06/2023 0934      Component Value Date/Time   TSH 0.70 04/21/2024 0947   Nutritional Lab Results  Component Value Date   VD25OH 36.6 10/06/2023   VD25OH 40.3 06/02/2023   VD25OH 43.1 02/24/2023     ASSESSMENT AND PLAN  TREATMENT PLAN FOR OBESITY:  Recommended Dietary Goals  Kristoff is currently in the action stage of change. As such, his goal is to continue weight management plan. He has agreed to the Category 4 Plan.  Behavioral Intervention  We discussed the following Behavioral Modification Strategies today: increasing lean protein intake to established goals, decreasing simple carbohydrates , increasing vegetables, increasing fiber rich foods, increasing water  intake , work on meal planning and preparation, reading food labels , keeping healthy foods at home, planning for success, continue to work on maintaining a reduced calorie state, getting the recommended amount of protein, incorporating whole foods, making healthy choices, staying well hydrated and practicing mindfulness when eating., and increase protein intake, fibrous foods (25 grams per day for women, 30 grams for men) and water  to improve satiety and decrease hunger signals. .  Additional resources provided today: NA  Recommended Physical Activity Goals  Albin to exercise per PT recommendations.     ASSOCIATED CONDITIONS ADDRESSED TODAY  Action/Pln  Type 2 diabetes mellitus in patient with obesity (HCC) Will see him back after his ablation to discuss restarting Mounjaro .  Would restart him back on Mounjaro  2.5mg .    Hyperlipidemia associated  with type 2 diabetes mellitus (HCC) -     Lipid Panel With LDL/HDL Ratio  Continue to follow up with cardiology and PCP  Hyperthyroidism -     TSH -     T4, free -     T3  Will check due to his reoccurrence of a fib.  If abnormal, to reach out to endo and cardiology.    Vitamin D  insufficiency -     VITAMIN D  25 Hydroxy (Vit-D Deficiency, Fractures)  Will hold taking Vit D until after ablation.    Medication management -     Hepatic function panel  Obesity, Class II, BMI 35-39.9          Return in about 3 weeks (around 08/02/2024).SABRA He was informed of the importance of frequent follow up visits to maximize his success with intensive lifestyle modifications for his multiple health conditions.   ATTESTASTION STATEMENTS:  Reviewed by clinician on day of visit: allergies, medications, problem list, medical history, surgical history, family history, social history, and previous encounter notes.     Corean SAUNDERS. Victor Langenbach FNP-C "

## 2024-07-13 ENCOUNTER — Ambulatory Visit: Payer: Self-pay | Admitting: Nurse Practitioner

## 2024-07-13 LAB — LIPID PANEL WITH LDL/HDL RATIO
Cholesterol, Total: 142 mg/dL (ref 100–199)
HDL: 31 mg/dL — ABNORMAL LOW
LDL Chol Calc (NIH): 93 mg/dL (ref 0–99)
LDL/HDL Ratio: 3 ratio (ref 0.0–3.6)
Triglycerides: 94 mg/dL (ref 0–149)
VLDL Cholesterol Cal: 18 mg/dL (ref 5–40)

## 2024-07-13 LAB — VITAMIN D 25 HYDROXY (VIT D DEFICIENCY, FRACTURES): Vit D, 25-Hydroxy: 26.7 ng/mL — ABNORMAL LOW (ref 30.0–100.0)

## 2024-07-13 LAB — HEPATIC FUNCTION PANEL
ALT: 14 IU/L (ref 0–44)
AST: 12 IU/L (ref 0–40)
Albumin: 4.4 g/dL (ref 3.9–4.9)
Alkaline Phosphatase: 114 IU/L (ref 47–123)
Bilirubin Total: 0.7 mg/dL (ref 0.0–1.2)
Bilirubin, Direct: 0.25 mg/dL (ref 0.00–0.40)
Total Protein: 7 g/dL (ref 6.0–8.5)

## 2024-07-13 LAB — T4, FREE: Free T4: 1.45 ng/dL (ref 0.82–1.77)

## 2024-07-13 LAB — TSH: TSH: 1.12 u[IU]/mL (ref 0.450–4.500)

## 2024-07-13 LAB — T3: T3, Total: 148 ng/dL (ref 71–180)

## 2024-07-21 ENCOUNTER — Other Ambulatory Visit: Payer: Self-pay | Admitting: Internal Medicine

## 2024-07-23 NOTE — Anesthesia Preprocedure Evaluation (Signed)
"                                    Anesthesia Evaluation  Patient identified by MRN, date of birth, ID band Patient awake    Reviewed: Allergy & Precautions, H&P , NPO status , Patient's Chart, lab work & pertinent test results, reviewed documented beta blocker date and time   History of Anesthesia Complications Negative for: history of anesthetic complications  Airway Mallampati: III  TM Distance: >3 FB     Dental no notable dental hx.    Pulmonary neg pulmonary ROS, sleep apnea    breath sounds clear to auscultation       Cardiovascular Exercise Tolerance: Good hypertension, Pt. on medications and Pt. on home beta blockers negative cardio ROS Atrial Fibrillation  Rhythm:Regular Rate:Normal  IMPRESSIONS     1. Left ventricular ejection fraction, by estimation, is 50 to 55%. The  left ventricle has low normal function. The left ventricle has no regional  wall motion abnormalities. There is mild left ventricular hypertrophy.  Left ventricular diastolic  parameters are indeterminate.   2. Right ventricular systolic function is normal. The right ventricular  size is normal. Tricuspid regurgitation signal is inadequate for assessing  PA pressure.   3. The mitral valve is normal in structure. Trivial mitral valve  regurgitation. No evidence of mitral stenosis.   4. The aortic valve is tricuspid. Aortic valve regurgitation is trivial.  No aortic stenosis is present.   5. Aortic dilatation noted. 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     Neuro/Psych neg Seizures negative neurological ROS  negative psych ROS   GI/Hepatic negative GI ROS, Neg liver ROS,GERD  Medicated and Controlled,,  Endo/Other  diabetes, Type 2Hypothyroidism Hyperthyroidism Class 3 obesity  Renal/GU negative Renal ROS  negative genitourinary   Musculoskeletal  (+) Arthritis , Osteoarthritis,     Abdominal   Peds  Hematology negative hematology ROS (+)   Anesthesia Other Findings   Reproductive/Obstetrics negative OB ROS                              Anesthesia Physical Anesthesia Plan  ASA: 3  Anesthesia Plan: General   Post-op Pain Management: Tylenol  PO (pre-op)*   Induction: Intravenous  PONV Risk Score and Plan: 1 and Ondansetron  and Dexamethasone   Airway Management Planned: Oral ETT  Additional Equipment: None  Intra-op Plan:   Post-operative Plan: Extubation in OR  Informed Consent: I have reviewed the patients History and Physical, chart, labs and discussed the procedure including the risks, benefits and alternatives for the proposed anesthesia with the patient or authorized representative who has indicated his/her understanding and acceptance.     Dental advisory given  Plan Discussed with: CRNA and Anesthesiologist  Anesthesia Plan Comments: ( )         Anesthesia Quick Evaluation  "

## 2024-07-24 ENCOUNTER — Encounter (HOSPITAL_COMMUNITY): Payer: Self-pay | Admitting: Student in an Organized Health Care Education/Training Program

## 2024-07-24 ENCOUNTER — Encounter (HOSPITAL_COMMUNITY): Admission: RE | Payer: Self-pay | Source: Home / Self Care

## 2024-07-24 ENCOUNTER — Encounter (HOSPITAL_COMMUNITY): Payer: Self-pay | Admitting: Anesthesiology

## 2024-07-24 ENCOUNTER — Other Ambulatory Visit: Payer: Self-pay

## 2024-07-24 ENCOUNTER — Ambulatory Visit (HOSPITAL_COMMUNITY)
Admission: RE | Admit: 2024-07-24 | Discharge: 2024-07-24 | Disposition: A | Attending: Student in an Organized Health Care Education/Training Program | Admitting: Student in an Organized Health Care Education/Training Program

## 2024-07-24 DIAGNOSIS — Z7901 Long term (current) use of anticoagulants: Secondary | ICD-10-CM | POA: Insufficient documentation

## 2024-07-24 DIAGNOSIS — K219 Gastro-esophageal reflux disease without esophagitis: Secondary | ICD-10-CM | POA: Insufficient documentation

## 2024-07-24 DIAGNOSIS — I5030 Unspecified diastolic (congestive) heart failure: Secondary | ICD-10-CM | POA: Insufficient documentation

## 2024-07-24 DIAGNOSIS — Z7985 Long-term (current) use of injectable non-insulin antidiabetic drugs: Secondary | ICD-10-CM | POA: Insufficient documentation

## 2024-07-24 DIAGNOSIS — Z79899 Other long term (current) drug therapy: Secondary | ICD-10-CM | POA: Insufficient documentation

## 2024-07-24 DIAGNOSIS — Z8249 Family history of ischemic heart disease and other diseases of the circulatory system: Secondary | ICD-10-CM | POA: Insufficient documentation

## 2024-07-24 DIAGNOSIS — Z6837 Body mass index (BMI) 37.0-37.9, adult: Secondary | ICD-10-CM | POA: Insufficient documentation

## 2024-07-24 DIAGNOSIS — E0590 Thyrotoxicosis, unspecified without thyrotoxic crisis or storm: Secondary | ICD-10-CM | POA: Insufficient documentation

## 2024-07-24 DIAGNOSIS — G4733 Obstructive sleep apnea (adult) (pediatric): Secondary | ICD-10-CM | POA: Insufficient documentation

## 2024-07-24 DIAGNOSIS — D6869 Other thrombophilia: Secondary | ICD-10-CM | POA: Insufficient documentation

## 2024-07-24 DIAGNOSIS — E66813 Obesity, class 3: Secondary | ICD-10-CM | POA: Insufficient documentation

## 2024-07-24 DIAGNOSIS — I4819 Other persistent atrial fibrillation: Secondary | ICD-10-CM | POA: Insufficient documentation

## 2024-07-24 DIAGNOSIS — I484 Atypical atrial flutter: Secondary | ICD-10-CM | POA: Insufficient documentation

## 2024-07-24 DIAGNOSIS — I110 Hypertensive heart disease with heart failure: Secondary | ICD-10-CM | POA: Insufficient documentation

## 2024-07-24 LAB — POCT ACTIVATED CLOTTING TIME
Activated Clotting Time: 184 s
Activated Clotting Time: 189 s
Activated Clotting Time: 204 s
Activated Clotting Time: 240 s
Activated Clotting Time: 245 s
Activated Clotting Time: 276 s
Activated Clotting Time: 312 s
Activated Clotting Time: 322 s
Activated Clotting Time: 327 s
Activated Clotting Time: 291 s

## 2024-07-24 LAB — GLUCOSE, CAPILLARY
Glucose-Capillary: 239 mg/dL — ABNORMAL HIGH (ref 70–99)
Glucose-Capillary: 211 mg/dL — ABNORMAL HIGH (ref 70–99)
Glucose-Capillary: 234 mg/dL — ABNORMAL HIGH (ref 70–99)

## 2024-07-24 MED ORDER — FENTANYL CITRATE (PF) 100 MCG/2ML IJ SOLN
INTRAMUSCULAR | Status: AC
  Filled 2024-07-24: qty 2

## 2024-07-24 MED ORDER — RIVAROXABAN 20 MG PO TABS
20.0000 mg | ORAL_TABLET | Freq: Every day | ORAL | Status: DC
  Administered 2024-07-24: 20 mg via ORAL
  Filled 2024-07-24: qty 1

## 2024-07-24 MED ORDER — FENTANYL CITRATE (PF) 100 MCG/2ML IJ SOLN
INTRAMUSCULAR | Status: DC | PRN
  Administered 2024-07-24 (×2): 25 ug via INTRAVENOUS
  Administered 2024-07-24: 50 ug via INTRAVENOUS

## 2024-07-24 MED ORDER — HEPARIN SODIUM (PORCINE) 1000 UNIT/ML IJ SOLN
INTRAMUSCULAR | Status: AC
  Filled 2024-07-24: qty 10

## 2024-07-24 MED ORDER — ACETAMINOPHEN 500 MG PO TABS
1000.0000 mg | ORAL_TABLET | Freq: Once | ORAL | Status: AC
  Administered 2024-07-24: 1000 mg via ORAL
  Filled 2024-07-24: qty 2

## 2024-07-24 MED ORDER — SODIUM CHLORIDE 0.9% FLUSH: 3.0000 mL | INTRAVENOUS | Status: DC | PRN

## 2024-07-24 MED ORDER — FENTANYL CITRATE (PF) 100 MCG/2ML IJ SOLN: 25.0000 ug | INTRAMUSCULAR | Status: DC | PRN

## 2024-07-24 MED ORDER — HEPARIN (PORCINE) IN NACL 1000-0.9 UT/500ML-% IV SOLN
INTRAVENOUS | Status: DC | PRN
  Administered 2024-07-24 (×2): 500 mL

## 2024-07-24 MED ORDER — SODIUM CHLORIDE 0.9% FLUSH: 3.0000 mL | Freq: Two times a day (BID) | INTRAVENOUS | Status: DC

## 2024-07-24 MED ORDER — SUGAMMADEX SODIUM 200 MG/2ML IV SOLN
INTRAVENOUS | Status: DC | PRN
  Administered 2024-07-24: 200 mg via INTRAVENOUS

## 2024-07-24 MED ORDER — LIDOCAINE HCL (CARDIAC) PF 100 MG/5ML IV SOSY
PREFILLED_SYRINGE | INTRAVENOUS | Status: DC | PRN
  Administered 2024-07-24: 80 mg via INTRAVENOUS

## 2024-07-24 MED ORDER — ONDANSETRON HCL 4 MG/2ML IJ SOLN: 4.0000 mg | Freq: Once | INTRAMUSCULAR | Status: DC | PRN

## 2024-07-24 MED ORDER — HEPARIN SODIUM (PORCINE) 1000 UNIT/ML IJ SOLN
INTRAMUSCULAR | Status: DC | PRN
  Administered 2024-07-24: 2000 [IU] via INTRAVENOUS

## 2024-07-24 MED ORDER — PROTAMINE SULFATE 10 MG/ML IV SOLN
INTRAVENOUS | Status: DC | PRN
  Administered 2024-07-24: 30 mg via INTRAVENOUS

## 2024-07-24 MED ORDER — MIDAZOLAM HCL (PF) 2 MG/2ML IJ SOLN
INTRAMUSCULAR | Status: DC | PRN
  Administered 2024-07-24: 2 mg via INTRAVENOUS

## 2024-07-24 MED ORDER — HEPARIN SODIUM (PORCINE) 1000 UNIT/ML IJ SOLN
INTRAMUSCULAR | Status: DC | PRN
  Administered 2024-07-24: 10000 [IU] via INTRAVENOUS
  Administered 2024-07-24: 8000 [IU] via INTRAVENOUS
  Administered 2024-07-24: 20000 [IU] via INTRAVENOUS
  Administered 2024-07-24: 15000 [IU] via INTRAVENOUS
  Administered 2024-07-24: 8000 [IU] via INTRAVENOUS

## 2024-07-24 MED ORDER — OXYCODONE HCL 5 MG/5ML PO SOLN: 5.0000 mg | Freq: Once | ORAL | Status: DC | PRN

## 2024-07-24 MED ORDER — PROPOFOL 10 MG/ML IV BOLUS
INTRAVENOUS | Status: DC | PRN
  Administered 2024-07-24: 30 mg via INTRAVENOUS
  Administered 2024-07-24: 170 mg via INTRAVENOUS

## 2024-07-24 MED ORDER — PHENYLEPHRINE 80 MCG/ML (10ML) SYRINGE FOR IV PUSH (FOR BLOOD PRESSURE SUPPORT)
PREFILLED_SYRINGE | INTRAVENOUS | Status: DC | PRN
  Administered 2024-07-24 (×2): 120 ug via INTRAVENOUS
  Administered 2024-07-24: 80 ug via INTRAVENOUS
  Administered 2024-07-24: 120 ug via INTRAVENOUS
  Administered 2024-07-24 (×4): 80 ug via INTRAVENOUS

## 2024-07-24 MED ORDER — ONDANSETRON HCL 4 MG/2ML IJ SOLN
INTRAMUSCULAR | Status: DC | PRN
  Administered 2024-07-24: 4 mg via INTRAVENOUS

## 2024-07-24 MED ORDER — CEFAZOLIN SODIUM-DEXTROSE 2-4 GM/100ML-% IV SOLN
2.0000 g | Freq: Once | INTRAVENOUS | Status: AC
  Administered 2024-07-24: 2 g via INTRAVENOUS
  Filled 2024-07-24: qty 100

## 2024-07-24 MED ORDER — HEPARIN SODIUM (PORCINE) 5000 UNIT/ML IJ SOLN
INTRAMUSCULAR | Status: DC | PRN
  Administered 2024-07-24: 7000 [IU] via INTRAVENOUS

## 2024-07-24 MED ORDER — SODIUM CHLORIDE 0.9 % IV SOLN: 250.0000 mL | INTRAVENOUS | Status: DC | PRN

## 2024-07-24 MED ORDER — MIDAZOLAM HCL 2 MG/2ML IJ SOLN
INTRAMUSCULAR | Status: AC
  Filled 2024-07-24: qty 2

## 2024-07-24 MED ORDER — ROCURONIUM BROMIDE 10 MG/ML (PF) SYRINGE
PREFILLED_SYRINGE | INTRAVENOUS | Status: DC | PRN
  Administered 2024-07-24: 20 mg via INTRAVENOUS
  Administered 2024-07-24: 10 mg via INTRAVENOUS
  Administered 2024-07-24: 60 mg via INTRAVENOUS

## 2024-07-24 MED ORDER — PHENYLEPHRINE HCL-NACL 20-0.9 MG/250ML-% IV SOLN
INTRAVENOUS | Status: DC | PRN
  Administered 2024-07-24: 25 ug/min via INTRAVENOUS

## 2024-07-24 MED ORDER — OXYCODONE HCL 5 MG PO TABS: 5.0000 mg | ORAL_TABLET | Freq: Once | ORAL | Status: DC | PRN

## 2024-07-24 MED ORDER — SODIUM CHLORIDE 0.9 % IV SOLN: INTRAVENOUS | Status: DC

## 2024-07-24 MED ORDER — DEXAMETHASONE SOD PHOSPHATE PF 10 MG/ML IJ SOLN
INTRAMUSCULAR | Status: DC | PRN
  Administered 2024-07-24: 8 mg via INTRAVENOUS

## 2024-07-24 NOTE — Progress Notes (Signed)
 Discharge instructions reviewed with patient and spouse at bedside. Denies questions or concerns. PT tolerated PO intake. Ambulated in the hallway, was able to void without difficulty. Seen by MD incision site remains clean dry and intact. No s/s of complications. PT escorted from the unit via wheel chair to personal vehicle. Received xarelto  prior to discharge.

## 2024-07-24 NOTE — Discharge Instructions (Addendum)

## 2024-07-24 NOTE — Op Note (Signed)
 "  Procedure:  Intracardiac catheter ablation AFIB including Transseptal Cath (CPT 93656) Additional linear or focal AF ablation (CPT [phone])  Additional atrial ablation of discrete mechanism (CPT [phone])   Pre-Op Diagnosis: atypical AFL, persistent AF    Post-Op Diagnosis: same    Procedure Date: 07/24/24   Attending: Adina Primus, MD    Anesthesia: general anesthesia    Initial Intervals: AFL, QRS 142, QT 368, RR 491, TCL 237 ms, concentric CS activation    Procedure: The patient entered the EP lab in a fasting nonsedated state. The procedural time-out was achieved. The patient was placed under general anesthesia by Anesthesiology. Once the patient was draped and prepped in normal fashion with multiple layers of Hibiclens  scrub in the bilateral groins, access to the veins was achieved under ultrasound guidance using the modified Seldinger technique. Following access all sites were pre-closed with Perclose ProStyle suture mediated closure.    Access/sheath: - RFV: 8 Fr short sheath->8.5 Fr Sm curl Vizigo - RFV: 8 Fr short sheath  - RFV: 9 Fr short sheath   Catheters: - 8.5 Fr SoundStar ICE - Varipulse PFA catheter - OctaRay 3-3-3-3-3-3 mapping catheter  - J&J D/F QDOT ablation catheter  - Webster CS D/F decapolar catheter   Mapping and Ablation (RA):  Next, a Webster CS decapolar catheter was advanced to the CS. The AFL had concentric CS activation and a AFL TCL of 235-245 ms with a small amount of wobble. Almost the entire TCL was contained within the RA. There was an area of focal scar along the anterior RA extending to the SVC with low voltage. The AFL appeared to be using the SVC and the scar for part of the circuit. Mapping of the CTI with a small area of low amplitude signal near the valve. Ablation (40 W, F' 500-550) was applied lateral to the prior CTI ablation to extend the line the valve and continuous ablation was applied from the TV annulus to the IVC to reinforce the  existing CTI ablation. This did not affect the TCL. After high output pacing to assess for phrenic capture ablation (35 W, F' 400-500) was applied from the SVC to the IVC through the area of low voltage/scar. This again did not affect the AFL TCL.   Transseptal Access Systemic heparinization was given to maintain ACT's >350. This took a substantial amount of heparin  to obtain ACT > 300 (58,000 units). Multiple ACT machines were cross checked, heparin  from different vials, given through central access. All with stable/low values. Ultimately after switching to a different LOT number I was able to get the ACT therapeutic. Transseptal puncture was performed with the VersaCross wire through the Vizigo sheath using ICE and fluoroscopy. The sheath was carefully aspirated and flushed.      Mapping (LA): Next, a deflectable OctaRay was advanced into the left atrium through the Vizigo sheath.  A 3-dimensional electroanatomic map was constructed of the left atrium and pulmonary veins using the Carto mapping system. There was isolation of the LSPV and LIPV although there was one area of focal connection in the LIPV which was likely epicardial. It was surrounded by dense scar. The RSPV was isolated although there was fractionation along the anterior septum adjacent to the RSPV. The RIPV was reconnected. The PW had a thin area of low voltage through the center but otherwise was isolated. Mapping of the RIPV os with near continuous activation of the AFL TCL.   Ablation: The OctaRay was removed from the Vizigo sheath  and the Carto Varipulse PFA catheter was advanced to the LSPV os. The PFA catheter was advanced to the LIPV (focal area of activation), RSPV and RIPV. Ablation in the RIPV resulted in sudden activation shift of the AFL from concentric to midline/stacked and the AFL TCL changed from 240 ms to 270 ms. Following ablation of the RPVs, catheter pressure at the PW/roof resulted in AFL termination. Ablation was  applied in this area and across the PW. Repeat mapping with the OctaRay catheter confirmed repeat isolation in the LIPV, RSPV and RIPV with entrance and exit block along with PWI.   The CTI was checked again with the ablation catheter with persistent bidirectional block observed (CSP->lateral 180 ms, ablation lateral->CSP 170 ms and moving more lateral 145 ms).    Heparinization was then reversed with protamine . Catheters and sheaths were pulled and hemostasis obtained with Perclose ProStyle sutures. No complications were evident. Final ICE assessment without pericardial effusion. The patient was transported to recovery.   Final intervals: NSR, PR 181, QRS 152, QT 434, RR 871   Summary: Atypical AFL on arrival  RA map with anterior focal scar/low voltage Repeat CTI ablation with lateral CTI line did not affect TCL  Ablation through RA focal scar (SVC->IVC) did not affect TCL  Successful transseptal puncture x 1 with ICE guidance  3-D mapping of the RA, LA and PV's Focal reconnection of the RIPV, RSPV isolated, RIPV reconnected, PW with thin area of reconnection  PFA of the RIPV using J&J Varipulse PFA with change in AFL TCL and activation  PFA of the PW/roof with AFL termination  PFA ablation of the LIPV, PW, extension of the RSPV anterior, and RIPV  Persistent CTI bidirectional block at case conclusion    Recommendations: Bedrest x 2 hours  Anti-coagulation: resume OP Xarelto  20 mg daily 2 hours post procedure   Anti-platelet: none  Anti-arrhythmic: resume OP digoxin  until f/u Rate control: resume OP diltiazem  and toprol  XL until f/u  EP f/u to be scheduled    Donnice DELENA Primus, MD [facility]  Cardiac Electrophysiology  "

## 2024-07-24 NOTE — Anesthesia Postprocedure Evaluation (Signed)
"   Anesthesia Post Note  Patient: Geoffrey Robinson  Procedure(s) Performed: ATRIAL FIBRILLATION ABLATION     Patient location during evaluation: PACU Anesthesia Type: General Level of consciousness: awake and alert Pain management: pain level controlled Vital Signs Assessment: post-procedure vital signs reviewed and stable Respiratory status: spontaneous breathing, nonlabored ventilation, respiratory function stable and patient connected to nasal cannula oxygen Cardiovascular status: blood pressure returned to baseline and stable Postop Assessment: no apparent nausea or vomiting Anesthetic complications: no   No notable events documented.  Last Vitals:  Vitals:   07/24/24 1115 07/24/24 1120  BP: 119/79 128/79  Pulse: 77 77  Resp: (!) 27 (!) 24  Temp:    SpO2: 91% 95%    Last Pain:  Vitals:   07/24/24 0559  TempSrc: Oral  PainSc: 0-No pain   Pain Goal:                   Lanisa Ishler      "

## 2024-07-24 NOTE — Anesthesia Procedure Notes (Signed)
 Procedure Name: Intubation Date/Time: 07/24/2024 7:40 AM  Performed by: Cindie Donald CROME, CRNAPre-anesthesia Checklist: Patient identified, Emergency Drugs available, Suction available and Patient being monitored Patient Re-evaluated:Patient Re-evaluated prior to induction Oxygen Delivery Method: Circle System Utilized Preoxygenation: Pre-oxygenation with 100% oxygen Induction Type: IV induction Ventilation: Mask ventilation without difficulty Laryngoscope Size: Mac and 4 Grade View: Grade I Tube type: Oral Tube size: 7.5 mm Number of attempts: 1 Airway Equipment and Method: Stylet Placement Confirmation: ETT inserted through vocal cords under direct vision, positive ETCO2 and breath sounds checked- equal and bilateral Secured at: 22 cm Tube secured with: Tape Dental Injury: Teeth and Oropharynx as per pre-operative assessment

## 2024-07-25 ENCOUNTER — Encounter (HOSPITAL_COMMUNITY): Payer: Self-pay | Admitting: Student in an Organized Health Care Education/Training Program

## 2024-07-25 ENCOUNTER — Telehealth (HOSPITAL_COMMUNITY): Payer: Self-pay

## 2024-08-01 ENCOUNTER — Ambulatory Visit: Admitting: Nurse Practitioner

## 2024-08-21 ENCOUNTER — Ambulatory Visit (HOSPITAL_COMMUNITY): Admitting: Physician Assistant

## 2024-08-25 ENCOUNTER — Ambulatory Visit (HOSPITAL_COMMUNITY): Admitting: Physician Assistant

## 2024-10-20 ENCOUNTER — Ambulatory Visit: Admitting: Internal Medicine

## 2024-10-23 ENCOUNTER — Ambulatory Visit: Admitting: Student

## 2024-10-27 ENCOUNTER — Ambulatory Visit: Admitting: Student
